# Patient Record
Sex: Female | Born: 1955 | Race: White | Hispanic: No | Marital: Married | Smoking: Never smoker
Health system: Southern US, Community
[De-identification: ages and names within clinical notes are randomized; demographics above are authoritative.]

## PROBLEM LIST (undated history)

## (undated) DIAGNOSIS — N390 Urinary tract infection, site not specified: Secondary | ICD-10-CM

## (undated) DIAGNOSIS — R002 Palpitations: Secondary | ICD-10-CM

## (undated) DIAGNOSIS — G2581 Restless legs syndrome: Secondary | ICD-10-CM

## (undated) DIAGNOSIS — R55 Syncope and collapse: Secondary | ICD-10-CM

## (undated) DIAGNOSIS — E785 Hyperlipidemia, unspecified: Secondary | ICD-10-CM

## (undated) DIAGNOSIS — K219 Gastro-esophageal reflux disease without esophagitis: Secondary | ICD-10-CM

## (undated) DIAGNOSIS — I10 Essential (primary) hypertension: Secondary | ICD-10-CM

## (undated) DIAGNOSIS — E538 Deficiency of other specified B group vitamins: Secondary | ICD-10-CM

## (undated) DIAGNOSIS — N189 Chronic kidney disease, unspecified: Secondary | ICD-10-CM

## (undated) DIAGNOSIS — F329 Major depressive disorder, single episode, unspecified: Secondary | ICD-10-CM

## (undated) DIAGNOSIS — F32A Depression, unspecified: Secondary | ICD-10-CM

## (undated) HISTORY — DX: Depression, unspecified: F32.A

## (undated) HISTORY — PX: SALPINGOOPHORECTOMY: SHX82

## (undated) HISTORY — DX: Hyperlipidemia, unspecified: E78.5

## (undated) HISTORY — DX: Deficiency of other specified B group vitamins: E53.8

## (undated) HISTORY — DX: Major depressive disorder, single episode, unspecified: F32.9

## (undated) HISTORY — DX: Urinary tract infection, site not specified: N39.0

## (undated) HISTORY — DX: Essential (primary) hypertension: I10

## (undated) HISTORY — DX: Chronic kidney disease, unspecified: N18.9

## (undated) HISTORY — DX: Gastro-esophageal reflux disease without esophagitis: K21.9

## (undated) HISTORY — DX: Restless legs syndrome: G25.81

## (undated) HISTORY — DX: Syncope and collapse: R55

## (undated) HISTORY — DX: Palpitations: R00.2

---

## 1974-09-18 HISTORY — PX: BREAST EXCISIONAL BIOPSY: SUR124

## 2000-02-14 ENCOUNTER — Encounter (INDEPENDENT_AMBULATORY_CARE_PROVIDER_SITE_OTHER): Payer: Self-pay

## 2000-02-14 ENCOUNTER — Ambulatory Visit (HOSPITAL_COMMUNITY): Admission: RE | Admit: 2000-02-14 | Discharge: 2000-02-14 | Payer: Self-pay | Admitting: Urology

## 2000-04-04 ENCOUNTER — Encounter: Payer: Self-pay | Admitting: Gastroenterology

## 2000-04-04 ENCOUNTER — Encounter (INDEPENDENT_AMBULATORY_CARE_PROVIDER_SITE_OTHER): Payer: Self-pay

## 2000-04-04 ENCOUNTER — Ambulatory Visit (HOSPITAL_COMMUNITY): Admission: RE | Admit: 2000-04-04 | Discharge: 2000-04-04 | Payer: Self-pay | Admitting: Gastroenterology

## 2000-04-06 ENCOUNTER — Other Ambulatory Visit: Admission: RE | Admit: 2000-04-06 | Discharge: 2000-04-06 | Payer: Self-pay | Admitting: Obstetrics & Gynecology

## 2000-05-09 ENCOUNTER — Ambulatory Visit: Admission: RE | Admit: 2000-05-09 | Discharge: 2000-05-09 | Payer: Self-pay | Admitting: Gastroenterology

## 2000-05-09 ENCOUNTER — Encounter: Payer: Self-pay | Admitting: Gastroenterology

## 2000-05-14 ENCOUNTER — Ambulatory Visit (HOSPITAL_COMMUNITY): Admission: RE | Admit: 2000-05-14 | Discharge: 2000-05-14 | Payer: Self-pay | Admitting: Internal Medicine

## 2000-05-14 ENCOUNTER — Encounter: Payer: Self-pay | Admitting: Internal Medicine

## 2002-10-06 ENCOUNTER — Encounter: Admission: RE | Admit: 2002-10-06 | Discharge: 2002-10-06 | Payer: Self-pay | Admitting: Internal Medicine

## 2002-10-06 ENCOUNTER — Encounter: Payer: Self-pay | Admitting: Internal Medicine

## 2002-11-20 ENCOUNTER — Encounter: Payer: Self-pay | Admitting: Internal Medicine

## 2002-11-20 ENCOUNTER — Ambulatory Visit (HOSPITAL_COMMUNITY): Admission: RE | Admit: 2002-11-20 | Discharge: 2002-11-20 | Payer: Self-pay | Admitting: Internal Medicine

## 2002-11-24 ENCOUNTER — Ambulatory Visit (HOSPITAL_COMMUNITY): Admission: RE | Admit: 2002-11-24 | Discharge: 2002-11-24 | Payer: Self-pay | Admitting: Internal Medicine

## 2002-11-24 ENCOUNTER — Encounter: Payer: Self-pay | Admitting: Internal Medicine

## 2002-12-18 ENCOUNTER — Ambulatory Visit (HOSPITAL_COMMUNITY): Admission: RE | Admit: 2002-12-18 | Discharge: 2002-12-18 | Payer: Self-pay | Admitting: Gastroenterology

## 2002-12-18 ENCOUNTER — Encounter: Payer: Self-pay | Admitting: Gastroenterology

## 2003-01-15 ENCOUNTER — Other Ambulatory Visit: Admission: RE | Admit: 2003-01-15 | Discharge: 2003-01-15 | Payer: Self-pay | Admitting: Obstetrics and Gynecology

## 2003-02-20 ENCOUNTER — Ambulatory Visit (HOSPITAL_COMMUNITY): Admission: RE | Admit: 2003-02-20 | Discharge: 2003-02-20 | Payer: Self-pay | Admitting: Obstetrics & Gynecology

## 2003-02-20 ENCOUNTER — Encounter (INDEPENDENT_AMBULATORY_CARE_PROVIDER_SITE_OTHER): Payer: Self-pay

## 2003-12-30 ENCOUNTER — Encounter: Admission: RE | Admit: 2003-12-30 | Discharge: 2003-12-30 | Payer: Self-pay | Admitting: Family Medicine

## 2004-01-14 ENCOUNTER — Encounter: Admission: RE | Admit: 2004-01-14 | Discharge: 2004-01-14 | Payer: Self-pay | Admitting: Sports Medicine

## 2004-01-21 ENCOUNTER — Encounter: Admission: RE | Admit: 2004-01-21 | Discharge: 2004-01-21 | Payer: Self-pay | Admitting: Family Medicine

## 2004-01-28 ENCOUNTER — Encounter: Admission: RE | Admit: 2004-01-28 | Discharge: 2004-01-28 | Payer: Self-pay | Admitting: Sports Medicine

## 2004-02-09 ENCOUNTER — Ambulatory Visit (HOSPITAL_COMMUNITY): Admission: RE | Admit: 2004-02-09 | Discharge: 2004-02-09 | Payer: Self-pay | Admitting: Family Medicine

## 2004-02-18 ENCOUNTER — Encounter: Admission: RE | Admit: 2004-02-18 | Discharge: 2004-02-18 | Payer: Self-pay | Admitting: Sports Medicine

## 2004-12-09 ENCOUNTER — Other Ambulatory Visit: Admission: RE | Admit: 2004-12-09 | Discharge: 2004-12-09 | Payer: Self-pay | Admitting: Obstetrics & Gynecology

## 2005-01-18 ENCOUNTER — Encounter: Admission: RE | Admit: 2005-01-18 | Discharge: 2005-01-18 | Payer: Self-pay | Admitting: Family Medicine

## 2005-05-06 ENCOUNTER — Encounter: Admission: RE | Admit: 2005-05-06 | Discharge: 2005-05-06 | Payer: Self-pay | Admitting: Family Medicine

## 2005-10-13 ENCOUNTER — Encounter: Admission: RE | Admit: 2005-10-13 | Discharge: 2005-10-13 | Payer: Self-pay | Admitting: Family Medicine

## 2006-11-15 ENCOUNTER — Encounter: Admission: RE | Admit: 2006-11-15 | Discharge: 2006-11-15 | Payer: Self-pay | Admitting: Nephrology

## 2009-10-19 ENCOUNTER — Encounter: Admission: RE | Admit: 2009-10-19 | Discharge: 2009-10-19 | Payer: Self-pay | Admitting: Family Medicine

## 2010-06-24 ENCOUNTER — Encounter: Payer: Self-pay | Admitting: Cardiology

## 2010-07-01 ENCOUNTER — Encounter: Payer: Self-pay | Admitting: Cardiology

## 2010-07-19 ENCOUNTER — Ambulatory Visit: Payer: Self-pay | Admitting: Cardiology

## 2010-07-19 DIAGNOSIS — I10 Essential (primary) hypertension: Secondary | ICD-10-CM

## 2010-07-19 DIAGNOSIS — E785 Hyperlipidemia, unspecified: Secondary | ICD-10-CM

## 2010-07-19 DIAGNOSIS — R079 Chest pain, unspecified: Secondary | ICD-10-CM

## 2010-07-19 HISTORY — DX: Chest pain, unspecified: R07.9

## 2010-07-25 ENCOUNTER — Telehealth: Payer: Self-pay | Admitting: Cardiology

## 2010-08-08 ENCOUNTER — Ambulatory Visit: Payer: Self-pay

## 2010-08-08 ENCOUNTER — Ambulatory Visit: Payer: Self-pay | Admitting: Cardiology

## 2010-10-08 ENCOUNTER — Encounter: Payer: Self-pay | Admitting: Gastroenterology

## 2010-10-09 ENCOUNTER — Encounter: Payer: Self-pay | Admitting: Family Medicine

## 2010-10-18 NOTE — Assessment & Plan Note (Signed)
Summary: np6/chest pains   Visit Type:  Initial Consult Primary Provider:  Dr. Raquel James  CC:  chest pain.  History of Present Illness: 55 yo with history of HTN and hyperlipidemia presents for evaluation of burning in chest.  This has been going on for several months.  These episodes do not seem to be related to exertion or meals.  The chest burning episodes had been occurring daily initially.  Patient's PCP started her on two times a day omeprazole which seems to have ameliorated but not resolved the symptoms (not occurring as often).  She has also noted that she gets short of breath walking around the block near her house.  She is able to climb steps and do most activities without any trouble, however.  She was recently started on simvastatin for elevated LDL cholesterol.   Patient is worried because she has a strong family history of CAD (I cared for her father).  Her mother's angina presented as heartburn-type symptoms.   ECG: NSR, normal  Labs (10/11): HDL 62, LDL 146, LDL particle number 1461, creatinine 1.18, K 4.0  Current Medications (verified): 1)  Cymbalta 30 Mg Cpep (Duloxetine Hcl) .... 3 Caps Am 2)  Diovan Hct 80-12.5 Mg Tabs (Valsartan-Hydrochlorothiazide) .... Take 1 Tablet By Mouth Once A Day 3)  Atenolol 25 Mg Tabs (Atenolol) .... Take One Tablet By Mouth Daily 4)  Zocor 10 Mg Tabs (Simvastatin) .... Take 1 Tablet By Mouth Once A Day 5)  Prilosec Otc 20 Mg Tbec (Omeprazole Magnesium) .... Take 1 Tablet By Mouth Two Times A Day  Allergies (verified): No Known Drug Allergies  Past History:  Past Medical History: 1. Hyperlipidemia 2. Depression 3. HTN: Had ankle swelling with amlodipine 4. GERD 5. B12 deficiency 6. CKD 7. Restless leg syndrome  Family History: Father with first MI at 59, developed ischemic cardiomyopathy. Mother with MI in her 81s, had sudden cardiac death  Social History: Never smoked.  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  Lives in [place]. Married.   Review of Systems       All systems reviewed and negative except as per HPI.   Vital Signs:  Patient profile:   55 year old female Height:      66 inches Weight:      131.50 pounds BMI:     21.30 Pulse rate:   60 / minute Pulse rhythm:   regular Resp:     18 per minute BP sitting:   122 / 73  (left arm) Cuff size:   regular  Vitals Entered By: Vikki Ports (July 19, 2010 11:01 AM)  Physical Exam  General:  Well developed, well nourished, in no acute distress. Head:  normocephalic and atraumatic Nose:  no deformity, discharge, inflammation, or lesions Mouth:  Teeth, gums and palate normal. Oral mucosa normal. Neck:  Neck supple, no JVD. No masses, thyromegaly or abnormal cervical nodes. Lungs:  Clear bilaterally to auscultation and percussion. Heart:  Non-displaced PMI, chest non-tender; regular rate and rhythm, S1, S2 without murmurs, rubs or gallops. Carotid upstroke normal, no bruit.  Pedals normal pulses. No edema, no varicosities. Abdomen:  Bowel sounds positive; abdomen soft and non-tender without masses, organomegaly, or hernias noted. No hepatosplenomegaly. Msk:  Back normal, normal gait. Muscle strength and tone normal. Extremities:  No clubbing or cyanosis. Neurologic:  Alert and oriented x 3. Skin:  Intact without lesions or rashes. Psych:  Normal affect.   Impression & Recommendations:  Problem #  1:  CHEST PAIN (ICD-786.50) Patient has been having atypical chest pain (burning in chest).  This is a new symptom over the last couple of months.  This certainly could be GERD as it has improved with omeprazole.  However, she has a very strong family history of coronary disease and also has HTN and hyperlipidemia.  I will risk stratify her with an ETT-myoview.  She should start ASA 81 mg daily.   Problem # 2:  HYPERTENSION, UNSPECIFIED (ICD-401.9) BP is under good control.   Problem # 3:  HYPERLIPIDEMIA-MIXED  (ICD-272.4) LDL 146 with significantly elevated LDL particle number.  Given her family history, I will increase her simvastatin up to 20 mg daily.    Other Orders: Nuclear Stress Test (Nuc Stress Test)  Patient Instructions: 1)  Your physician has recommended you make the following change in your medication:  2)  Increase Zocor(simvastatin) to 20mg  daily in the evening--you can take two 10mg  tablets. 3)  Take Aspirin 81mg  daily--this should be buffered or coated. 4)  Your physician has requested that you have an exercise stress myoview.  For further information please visit [email or web address]  Please follow instruction sheet, as given. 5)  Your physician recommends that you schedule a follow-up appointment in: 2 weeks with Dr Shirlee Latch. Prescriptions: ZOCOR 20 MG TABS (SIMVASTATIN) one in the evening  #30 x 3   Entered by:   Katina Dung, RN, BSN   Authorized by:   Marca Ancona, MD   Signed by:   Katina Dung, RN, BSN on 07/19/2010   Method used:   Electronically to        [facility]* (retail)       [address]       [place], [place]  [address]       Ph: [phone]       Fax: [phone]   RxID:   [id number]

## 2010-10-18 NOTE — Letter (Signed)
Summary: Family @ [facility]  Family @ [facility]   Imported By: Roderic Ovens 07/25/2010 15:32:37  _____________________________________________________________________  External Attachment:    Type:   Image     Comment:   External Document

## 2010-10-18 NOTE — Progress Notes (Signed)
Summary: [employer] would not authorize myoview  Phone Note Outgoing Call   Call placed by: Katina Dung, RN, BSN,  July 25, 2010 12:22 PM Call placed to: Patient Summary of Call: insurance would not authorize myoview  Follow-up for Phone Call        Dr Shirlee Latch talked with Dr Wallace Cullens at [facility] [phone] opt 2--he would  not authorize myoview--Dr Shirlee Latch recommended pt have GXT since BCBS would not authorize myoview--I talked with pt --she verbalized understanding-     Appended Document: BCBS would not authorize myoview I talked with Burna Mortimer and cancelled myoview scheduled for 07/27/10

## 2010-12-20 ENCOUNTER — Ambulatory Visit: Payer: Self-pay | Admitting: Family Medicine

## 2011-01-05 ENCOUNTER — Ambulatory Visit: Payer: Self-pay | Admitting: Family Medicine

## 2011-01-13 ENCOUNTER — Other Ambulatory Visit (HOSPITAL_COMMUNITY)
Admission: RE | Admit: 2011-01-13 | Discharge: 2011-01-13 | Disposition: A | Discharge status: Home / Self Care | Payer: BC Managed Care – PPO | Source: Ambulatory Visit | Attending: Family Medicine | Admitting: Family Medicine

## 2011-01-13 ENCOUNTER — Ambulatory Visit (INDEPENDENT_AMBULATORY_CARE_PROVIDER_SITE_OTHER): Payer: BC Managed Care – PPO | Admitting: Family Medicine

## 2011-01-13 ENCOUNTER — Encounter: Payer: Self-pay | Admitting: Family Medicine

## 2011-01-13 DIAGNOSIS — I10 Essential (primary) hypertension: Secondary | ICD-10-CM | POA: Insufficient documentation

## 2011-01-13 DIAGNOSIS — Z01419 Encounter for gynecological examination (general) (routine) without abnormal findings: Secondary | ICD-10-CM | POA: Insufficient documentation

## 2011-01-13 DIAGNOSIS — E538 Deficiency of other specified B group vitamins: Secondary | ICD-10-CM

## 2011-01-13 DIAGNOSIS — N189 Chronic kidney disease, unspecified: Secondary | ICD-10-CM

## 2011-01-13 DIAGNOSIS — Z1159 Encounter for screening for other viral diseases: Secondary | ICD-10-CM | POA: Insufficient documentation

## 2011-01-13 DIAGNOSIS — Z1231 Encounter for screening mammogram for malignant neoplasm of breast: Secondary | ICD-10-CM

## 2011-01-13 DIAGNOSIS — N952 Postmenopausal atrophic vaginitis: Secondary | ICD-10-CM

## 2011-01-13 DIAGNOSIS — Z0000 Encounter for general adult medical examination without abnormal findings: Secondary | ICD-10-CM

## 2011-01-13 DIAGNOSIS — E785 Hyperlipidemia, unspecified: Secondary | ICD-10-CM | POA: Insufficient documentation

## 2011-01-13 LAB — BASIC METABOLIC PANEL
Calcium: 9.3 mg/dL (ref 8.4–10.5)
GFR: 44.93 mL/min — ABNORMAL LOW (ref >60.00)
Glucose, Bld: 89 mg/dL (ref 70–99)
Sodium: 141 mEq/L (ref 135–145)

## 2011-01-13 LAB — LIPID PANEL
HDL: 44.8 mg/dL (ref >39.00)
Total CHOL/HDL Ratio: 5
Triglycerides: 86 mg/dL (ref 0.0–149.0)
VLDL: 17.2 mg/dL (ref 0.0–40.0)

## 2011-01-13 MED ORDER — ESTRADIOL 0.1 MG/GM VA CREA: 1.0000 g | TOPICAL_CREAM | Freq: Every day | VAGINAL | Status: DC

## 2011-01-13 MED ORDER — ESTRADIOL 0.1 MG/GM VA CREA: 0.1647 | TOPICAL_CREAM | Freq: Every day | VAGINAL | Status: DC

## 2011-01-13 NOTE — Assessment & Plan Note (Signed)
Recheck BMET today 

## 2011-01-13 NOTE — Patient Instructions (Signed)
Great to meet you. Please stop by to see Heather [name] on your way out. 

## 2011-01-13 NOTE — Assessment & Plan Note (Signed)
Deteriorated. Discussed risks and benefits of HRT. She would like to restart estrace.

## 2011-01-13 NOTE — Assessment & Plan Note (Signed)
Reviewed preventive care protocols, scheduled due services, and updated immunizations Discussed nutrition, exercise, diet, and healthy lifestyle.  Pap smear performed today. IFOB and mammogram ordered.

## 2011-01-13 NOTE — Progress Notes (Signed)
55 yo female here to establish care and for CPX.  Was seeing Dr. Raquel James until practice closed down.  HLD- has h/o elevated lipids, most recently in 06/2010- LDL 146, HDL 62.  Father had MI at 49 yo. Was prescribed Simvastatin 20 mg qhs but stopped taking it.  No reported side effect, just kept forgetting.  Vit B12 deficiency- was previously receiving injections.  Has not had B12 checked since last year. Does feel a little more fatigued. Denies SOB, CP or LE edema.  HTN- has had issues with HTN since she was in her 30s.  Currently taking Diovan/HCTZ and atenolol.  Denies any HA, blurred vision or LE edema.  H/o chronic renal insufficiency- will check BMET today, Cr has been stable between 1.3-1.4 according to pt.  Depression- stable on cymbalta.  Feels much less anxious since she retired.  Well woman- refusing colonoscopy but will to try IFOB. Has a GYN , Dr. Arlyce Dice, but has not been since 2009 so she is due for both mammogram and pap smear. G1P1.  No personal or family h/o uterine, ovarian, cervical or breast CA.  Vaginal dryness- was on vaginal estradiol but stopped following up with GYN. Has been postmenopausal since 2006.  Now having dryness and pain with intercourse. Denies hot flashes or insomnia.  The PMH, PSH, Social History, Family History, Medications, and allergies have been reviewed in [facility], and have been updated if relevant.  ROS: General: Denies fever, chills, sweats. No significant weight loss. Eyes: Denies blurring,significant itching ENT: Denies earache, sore throat, and hoarseness.  Cardiovascular: Denies chest pains, palpitations, dyspnea on exertion,  Respiratory: Denies cough, dyspnea at rest,wheeezing Breast: no concerns about lumps GI: Denies nausea, vomiting, diarrhea, constipation, change in bowel habits, abdominal pain, melena, hematochezia GU: Denies dysuria, hematuria, urinary hesitancy, nocturia, denies STD risk, no concerns about  discharge Musculoskeletal: Denies back pain, joint pain Derm: Denies rash, itching Neuro: Denies  paresthesias, frequent falls, frequent headaches Psych: Denies depression, anxiety Endocrine: Denies cold intolerance, heat intolerance, polydipsia Heme: Denies enlarged lymph nodes Allergy: No hayfever  Physical Exam: BP 122/88  Pulse 62  Temp(Src) 98.4 F (36.9 C) (Oral)  Ht 5\' 6"  (1.676 m)  Wt 139 lb 12.8 oz (63.413 kg)  BMI 22.56 kg/m2  General:  Well-developed,well-nourished,in no acute distress; alert,appropriate and cooperative throughout examination Head:  normocephalic and atraumatic.   Eyes:  vision grossly intact, pupils equal, pupils round, and pupils reactive to light.   Ears:  R ear normal and L ear normal.   Nose:  no external deformity.   Mouth:  good dentition.   Neck:  No deformities, masses, or tenderness noted. Breasts:  No mass, nodules, thickening, tenderness, bulging, retraction, inflamation, nipple discharge or skin changes noted.   Lungs:  Normal respiratory effort, chest expands symmetrically. Lungs are clear to auscultation, no crackles or wheezes. Heart:  Normal rate and regular rhythm. S1 and S2 normal without gallop, murmur, click, rub or other extra sounds. Abdomen:  Bowel sounds positive,abdomen soft and non-tender without masses, organomegaly or hernias noted. Rectal:  no external abnormalities.   Genitalia:  Pelvic Exam:        External: normal female genitalia without lesions or masses        Vagina: vaginal atrophy        Cervix: normal without lesions or masses        Adnexa: normal bimanual exam without masses or fullness        Uterus: normal by palpation  Pap smear: performed Msk:  No deformity or scoliosis noted of thoracic or lumbar spine.   Extremities:  No clubbing, cyanosis, edema, or deformity noted with normal full range of motion of all joints.   Neurologic:  alert & oriented X3 and gait normal.   Skin:  Intact without  suspicious lesions or rashes Cervical Nodes:  No lymphadenopathy noted Axillary Nodes:  No palpable lymphadenopathy Psych:  Cognition and judgment appear intact. Alert and cooperative with normal attention span and concentration. No apparent delusions, illusions, hallucinations

## 2011-01-13 NOTE — Assessment & Plan Note (Signed)
Stable. Continue current meds.   

## 2011-01-13 NOTE — Assessment & Plan Note (Signed)
-   Recheck lipid panel today

## 2011-01-19 ENCOUNTER — Inpatient Hospital Stay: Admission: RE | Admit: 2011-01-19 | Payer: BC Managed Care – PPO | Source: Ambulatory Visit

## 2011-01-23 ENCOUNTER — Encounter: Payer: Self-pay | Admitting: *Deleted

## 2011-01-24 ENCOUNTER — Encounter: Payer: Self-pay | Admitting: Family Medicine

## 2011-01-27 ENCOUNTER — Ambulatory Visit
Admission: RE | Admit: 2011-01-27 | Discharge: 2011-01-27 | Disposition: A | Discharge status: Home / Self Care | Payer: BC Managed Care – PPO | Source: Ambulatory Visit | Attending: Family Medicine | Admitting: Family Medicine

## 2011-01-27 ENCOUNTER — Other Ambulatory Visit: Payer: Self-pay | Admitting: Family Medicine

## 2011-01-27 ENCOUNTER — Ambulatory Visit: Admission: RE | Admit: 2011-01-27 | Payer: BC Managed Care – PPO | Source: Ambulatory Visit

## 2011-01-27 DIAGNOSIS — Z1231 Encounter for screening mammogram for malignant neoplasm of breast: Secondary | ICD-10-CM

## 2011-02-02 ENCOUNTER — Encounter: Payer: Self-pay | Admitting: *Deleted

## 2011-02-03 NOTE — Op Note (Signed)
[facility]  Patient:    Heather [name], Heather [name]                      MRN: [id number] Proc. Date: 02/14/00 Adm. Date:  [id number] Disc. Date: [id number] Attending:  Londell Moh CC:         Jamison Neighbor, M.D.             Kearney Hard, P.A., [facility]                           Operative Report  PREOPERATIVE DIAGNOSIS:  Interstitial cystitis.  POSTOPERATIVE DIAGNOSIS:  Interstitial cystitis.  PROCEDURE:  Cystoscopy, urethral calibration, hydrodistention of bladder, bladder biopsy, Marcaine and Pyridium instillation, Marcaine and Kenalog injection.  SURGEON:  Jamison Neighbor, M.D.  ANESTHESIA:  General.  COMPLICATIONS:  None.  DRAINS:  None.  BRIEF HISTORY:  This 55 year old female has urinary urgency, frequency, and pain.  She has not responded to anticholinergic therapy.  She did note that Urised cut down somewhat on her discomfort, but she notes she has to void every 20 minutes.  Urodynamic evaluation showed severe sensory urgency and strong discomfort when the patients bladder was filled beyond 68 cc.  She had a real sensation of pain and burning during filling but had no leak.  An outpatient cystoscopic examination in the office showed no evidence of stress incontinence but severe urgency with no other abnormalities detected.  Based on this patients symptoms of unexplained urgency and frequency not responding to anticholinergic therapy, it was felt that the patient should be evaluated for interstitial cystitis.  She understands the risks and benefits of the procedure, including the fact that the diagnostic procedure itself may or may not help her symptoms and certainly will temporarily increase her symptoms. She gave fully informed consent.  DESCRIPTION OF PROCEDURE:  After successful induction of general anesthesia, the patient was placed in the dorsal lithotomy position, prepped with Betadine, and draped in the usual  sterile fashion.  Careful bimanual examination revealed a normal urethra with no evidence of diverticulum.  There was no urethrocele, cystocele, rectocele, or enterocele.  There were no masses on bimanual exam.  The urethra was calibrated to 35 [place] with female urethral sounds, with no stenosis or stricture.  The bladder was carefully inspected.  It was free of any tumor or stones.  Both ureteral orifices were normal in configuration in location.  The patient had no evidence of any squamous metaplasia, no signs of cystitis cystica or cystitis glandularis. The bladder was distended to a pressure of 100 cmH2O for five minutes.  When the bladder was drained, there was a somewhat diminished bladder capacity of 650 cc, a terminal blood-tinge, and evidence of glomerulations throughout the bladder.  This is consistent with interstitial cystitis.  A bladder biopsy was taken and sent for mast cell analysis, and the biopsy site was cauterized.  A mixture of Marcaine and Pyridium was left in the bladder.  Marcaine and Kenalog were injected periurethrally.  A vaginal gauze was placed.  The patient was given Toradol in the operating room.  She was taken to the recovery room in good condition.  She will receive additional pain medication as well as Zofran for nausea and will receive a B&O suppository in the PACU. The patient will be given a prescription for Lorcet Plus to take as needed for pain, Pyridium Plus to take as needed for burning  _____ and to take on an as-needed basis.  She will return to my office in three weeks time for further follow-up. DD:  02/14/00 TD:  02/16/00 Job: [id number]

## 2011-02-03 NOTE — Procedures (Signed)
[facility]  Patient:    Heather [name], Heather [name]                      MRN: [id number] Proc. Date: 04/04/00 Adm. Date:  [id number] Disc. Date: [id number] Attending:  Deneen Harts CC:         Janae Bridgeman. Eloise Harman., M.D.             Tarri Fuller, PA-C                           Procedure Report  PROCEDURE PERFORMED:  Panendoscopy, biopsy, Savary esophageal dilation.  ENDOSCOPIST:  Griffith Citron, M.D.  INDICATIONS FOR PROCEDURE:  The patient is a 55 year old white female with a history of progressive solid food dysphagia over the past three months. Symptoms have somewhat improved with Pepcid and more recently treated with Aciphex with improved control.  Symptoms exacerbated postprandially and with stress.  Frequent impaction requiring self-induced regurgitation approximately once weekly.  Denies hematemesis.  Appetite is excellent.  Weight stable.  No risk factors.  Without tobacco, alcohol, family history.  Associated abdominal pain substernal radiating into the midback.  DESCRIPTION OF PROCEDURE:  After reviewing the nature of the procedure with the patient including potential risks of hemorrhage and perforation, and after discussing alternative methods of diagnosis, i.e. barium swallow, informed consent was signed.  The patient was brought to the fluoroscopy suite where she was premedicated with topical anesthetic followed by IV sedation totalling Versed 8 mg, fentanyl 100 mcg administered IV in divided doses, prior to and during the course of the procedure.  Using an Olympus video endoscope, the proximal esophagus intubated under direct vision. The oropharynx was normal without lesion of the epiglottis, vocal cords or piriform sinus.  The scope was advanced into the upper esophagus without difficulty.  The proximal segment was normal.  Beginning at the midsegment there was diffuse linear erythema extending from the midesophagus to the mucosal  Z-line at 37 cm.  No evidence of mucosal erosion. The mucosa was edematous, not friable.  No exudate.  I did not appreciate any stricture endoscopically.  The mucosal z-line was distinct at 37 cm.  No hiatal hernia was present.  The gastric fundus was normal except for a 1 cm polyp along the greater curve at the junction of the fundus and body.  This was biopsied.  The remainder of the stomach appeared normal throughout the body and antrum.  Pylorus symmetric.  Easily traversed.  Duodenal bulb and second portion were normal.  Retroflex view of the angularis, lesser curve, gastric cardia and fundus confirmed the finding of the polyp noted above but no additional lesion was appreciated.  A Savary guide wire was laid along the greater curve of the stomach.  Under fluoroscopic control, a 17 mm diameter dilator was passed across the diaphragmatic crus.  It was withdrawn along with the guide wire.  Tolerated without difficulty.  No heme staining or chest pain.  The patient tolerated the procedure without difficulty being maintained on Datascope monitor and low-flow oxygen throughout.  Returned to recovery in stable condition.  ASSESSMENT: 1. Esophagitis--probably reflux induced.  No endoscopic stricture appreciated. 2. Savary dilation--17 mm diameter. 3. Gastric polyp--benign appearing--biopsy obtained, rule out adenoma.  RECOMMENDATIONS: 1. Follow up pathology. 2. Change PPI to Nexium 40 mg daily. 3. ROV one month to review clinical course. DD:  04/04/00 TD:  04/06/00 Job: [id number]

## 2011-02-03 NOTE — Op Note (Signed)
NAME:  Heather [name], Heather [name]                         ACCOUNT NO.:  [id number]   MEDICAL RECORD NO.:  [id number]                   PATIENT TYPE:  AMB   LOCATION:  SDC                                  FACILITY:  [facility]   PHYSICIAN:  Ilda Mori, M.D.                DATE OF BIRTH:  1956-07-21   DATE OF PROCEDURE:  02/20/2003  DATE OF DISCHARGE:                                 OPERATIVE REPORT   PREOPERATIVE DIAGNOSIS:  Right lower quadrant pain.   POSTOPERATIVE DIAGNOSES:  1. Endometriosis.  2. Small right ovarian cyst.   PROCEDURE:  Laparoscopic right salpingo-oophorectomy.   SURGEON:  Ilda Mori, M.D.   ASSISTANT:  Carrington Clamp, M.D.   ANESTHESIA:  General endotracheal.   ESTIMATED BLOOD LOSS:  20 mL.   FINDINGS:  The left ovary appeared normal.  The left sigmoid was adherent to  the left upper pelvic sidewall.  The left tube appeared normal post  reanastomosis.  There was evidence of endometriosis in the broad ligament  and lateral to the uterosacral and small areas of endometriosis and  peritoneal scarring in the cul-de-sac.  The right adnexa, the right ovary  had a small cystic structure.  There was a small amount of endometriosis  noted in the right broad ligament and on the right uterosacral.  The  appendix appeared normal.  There was no other pathology noted.   INDICATIONS:  This is a 55 year old gravida 1, para 1, who noted severe  right lower quadrant pain for approximately two months that interfered with  her sleep and her work.  Evaluation by a gastroenterologist revealed no GI  pathology.  Ultrasound was normal.  Discussion with the patient was then  undertaken and because of the severity and persistence of the pain, a  decision was made to do a diagnostic laparoscopy and to remove the right  adnexa if any pathology was found.   DESCRIPTION OF PROCEDURE:  The patient was taken to the operating room and  after general anesthesia was induced, the patient was  placed in the dorsal  lithotomy position.  The abdomen and perineum and vagina were prepped and  draped in a sterile fashion.  The bladder was catheterized.  A Hulka  tenaculum was placed in the endocervical canal, affixed to the anterior lip  of  the cervix.  The surgeon re-gowned and gloved.  An incision was made at  the base of the umbilicus and a Veress needle introduced to create a  pneumoperitoneum.  After pneumoperitoneum, a 10 mm trocar was introduced  through the umbilical incision and the pelvis was viewed with the findings  noted above.  An accessory instrument was placed through stab wounds at the  right and left lower quadrant, taking care to avoid the inferior epigastric  arteries.  The appendix was viewed to be normal and was left in situ.  The  left adnexa showed some signs  of endometriosis but these were minimal and  did not involve the ovary and since the patient was having no pain in this  area and a decision had been made not to remove both adnexa, the left ovary  was left in situ as well.  Attention was turned to the right adnexa.  The  infundibulopelvic ligament was isolated, cauterized, and cut with the  tripolar cautery instrument.  The peritoneum was then cut free under the  ovary to drop the ureter out of the field.  The rest of the broad ligament  and infundibulopelvic ligament was cauterized and cut.  The ovarian  ligament, tubal isthmus, and round ligaments were cauterized and cut, and  the adnexa was removed.  The hemostasis was noted to be present.  The right  adnexa was then placed in an Endobag that was placed through the 10 mm  umbilical port.  A 5 mm camera was placed in the left lower quadrant to  visualize the operative field.  The bag was then removed through the  umbilical incision.  The umbilical fascia was closed with a figure-of-eight  0 Vicryl suture and the skin in the umbilical incision was closed with a 4-0  Dexon suture.  The right and left  lower quadrant incisions were closed with  Dermabond.  The patient tolerated the procedure well and left the operating  room in good condition.                                               Ilda Mori, M.D.    RK/MEDQ  D:  02/20/2003  T:  02/20/2003  Job:  [id number]

## 2011-04-24 ENCOUNTER — Other Ambulatory Visit: Payer: Self-pay | Admitting: Family Medicine

## 2011-04-24 ENCOUNTER — Other Ambulatory Visit: Payer: Self-pay | Admitting: *Deleted

## 2011-04-24 MED ORDER — VALSARTAN-HYDROCHLOROTHIAZIDE 80-12.5 MG PO TABS: 1.0000 | ORAL_TABLET | Freq: Every day | ORAL | Status: DC

## 2011-04-24 MED ORDER — ATENOLOL 25 MG PO TABS: 25.0000 mg | ORAL_TABLET | Freq: Every day | ORAL | Status: DC

## 2011-04-24 NOTE — Telephone Encounter (Signed)
Pt needs rx's for mail order. I will place form in your inbox

## 2011-07-12 ENCOUNTER — Other Ambulatory Visit: Payer: Self-pay | Admitting: *Deleted

## 2011-07-12 MED ORDER — OMEPRAZOLE 20 MG PO CPDR: 20.0000 mg | DELAYED_RELEASE_CAPSULE | Freq: Two times a day (BID) | ORAL | Status: DC

## 2011-07-20 ENCOUNTER — Telehealth: Payer: Self-pay | Admitting: Family Medicine

## 2011-07-20 MED ORDER — PROGESTERONE MICRONIZED 100 MG PO CAPS: 100.0000 mg | ORAL_CAPSULE | Freq: Every day | ORAL | Status: DC

## 2011-07-20 NOTE — Telephone Encounter (Signed)
Left voicemail for pt to return my call. Has been on unopposed estrogen for a few months now. Since she still has a uterus, I would like to add Prometrium 100 mg daily to her estrogen. I will send to pharmacy.

## 2011-07-21 ENCOUNTER — Telehealth: Payer: Self-pay | Admitting: Family Medicine

## 2011-07-21 NOTE — Telephone Encounter (Signed)
Spoke to pt, we had the wrong cell phone number. Number is [phone]. She has not been taking estrogen but would like to start. Will also add Prometrium 100 mg daily. Rx sent to her pharmacy.

## 2011-10-03 ENCOUNTER — Encounter: Payer: Self-pay | Admitting: Family Medicine

## 2011-10-03 ENCOUNTER — Ambulatory Visit (INDEPENDENT_AMBULATORY_CARE_PROVIDER_SITE_OTHER): Payer: BC Managed Care – PPO | Admitting: Family Medicine

## 2011-10-03 VITALS — BP 110/80 | HR 72 | Temp 98.3°F | Wt 142.5 lb

## 2011-10-03 DIAGNOSIS — I889 Nonspecific lymphadenitis, unspecified: Secondary | ICD-10-CM

## 2011-10-03 LAB — CBC WITH DIFFERENTIAL/PLATELET
Basophils Absolute: 0.1 10*3/uL (ref 0.0–0.1)
Basophils Relative: 1.2 % (ref 0.0–3.0)
HCT: 33.2 % — ABNORMAL LOW (ref 36.0–46.0)
Hemoglobin: 11.4 g/dL — ABNORMAL LOW (ref 12.0–15.0)
Lymphocytes Relative: 25.6 % (ref 12.0–46.0)
Lymphs Abs: 1.6 10*3/uL (ref 0.7–4.0)
MCHC: 34.3 g/dL (ref 30.0–36.0)
MCV: 88.6 fl (ref 78.0–100.0)
Neutro Abs: 4 10*3/uL (ref 1.4–7.7)
Platelets: 211 10*3/uL (ref 150.0–400.0)
RBC: 3.75 Mil/uL — ABNORMAL LOW (ref 3.87–5.11)
RDW: 14.2 % (ref 11.5–14.6)
WBC: 6.1 10*3/uL (ref 4.5–10.5)

## 2011-10-03 MED ORDER — AMOXICILLIN-POT CLAVULANATE 875-125 MG PO TABS: 1.0000 | ORAL_TABLET | Freq: Two times a day (BID) | ORAL | Status: AC

## 2011-10-03 NOTE — Patient Instructions (Signed)
Lymphadenopathy Lymphadenopathy means "disease of the lymph glands." But the term is usually used to describe swollen or enlarged lymph glands, also called lymph nodes. These are the bean-shaped organs found in many locations including the neck, underarm, and groin. Lymph glands are part of the immune system, which fights infections in your body. Lymphadenopathy can occur in just one area of the body, such as the neck, or it can be generalized, with lymph node enlargement in several areas. The nodes found in the neck are the most common sites of lymphadenopathy. CAUSES  When your immune system responds to germs (such as viruses or bacteria ), infection-fighting cells and fluid build up. This causes the glands to grow in size. This is usually not something to worry about. Sometimes, the glands themselves can become infected and inflamed. This is called lymphadenitis. Enlarged lymph nodes can be caused by many diseases:  Bacterial disease, such as strep throat or a skin infection.   Viral disease, such as a common cold.   Other germs, such as lyme disease, tuberculosis, or sexually transmitted diseases.   Cancers, such as lymphoma (cancer of the lymphatic system) or leukemia (cancer of the white blood cells).   Inflammatory diseases such as lupus or rheumatoid arthritis.   Reactions to medications.  Many of the diseases above are rare, but important. This is why you should see your caregiver if you have lymphadenopathy. SYMPTOMS   Swollen, enlarged lumps in the neck, back of the head or other locations.   Tenderness.   Warmth or redness of the skin over the lymph nodes.   Fever.  DIAGNOSIS  Enlarged lymph nodes are often near the source of infection. They can help healthcare providers diagnose your illness. For instance:   Swollen lymph nodes around the jaw might be caused by an infection in the mouth.   Enlarged glands in the neck often signal a throat infection.   Lymph nodes that  are swollen in more than one area often indicate an illness caused by a virus.  Your caregiver most likely will know what is causing your lymphadenopathy after listening to your history and examining you. Blood tests, x-rays or other tests may be needed. If the cause of the enlarged lymph node cannot be found, and it does not go away by itself, then a biopsy may be needed. Your caregiver will discuss this with you. TREATMENT  Treatment for your enlarged lymph nodes will depend on the cause. Many times the nodes will shrink to normal size by themselves, with no treatment. Antibiotics or other medicines may be needed for infection. Only take over-the-counter or prescription medicines for pain, discomfort or fever as directed by your caregiver. HOME CARE INSTRUCTIONS  Swollen lymph glands usually return to normal when the underlying medical condition goes away. If they persist, contact your health-care provider. He/she might prescribe antibiotics or other treatments, depending on the diagnosis. Take any medications exactly as prescribed. Keep any follow-up appointments made to check on the condition of your enlarged nodes.  SEEK MEDICAL CARE IF:   Swelling lasts for more than two weeks.   You have symptoms such as weight loss, night sweats, fatigue or fever that does not go away.   The lymph nodes are hard, seem fixed to the skin or are growing rapidly.   Skin over the lymph nodes is red and inflamed. This could mean there is an infection.  SEEK IMMEDIATE MEDICAL CARE IF:   Fluid starts leaking from the area of the   enlarged lymph node.   You develop a fever of 102 F (38.9 C) or greater.   Severe pain develops (not necessarily at the site of a large lymph node).   You develop chest pain or shortness of breath.   You develop worsening abdominal pain.  MAKE SURE YOU:   Understand these instructions.   Will watch your condition.   Will get help right away if you are not doing well or get  worse.  Document Released: 06/13/2008 Document Revised: 05/17/2011 Document Reviewed: 06/13/2008 [employer] Patient Information 2012 [employer]. 

## 2011-10-03 NOTE — Progress Notes (Signed)
  Subjective:    Patient ID: Heather [name], female    DOB: Apr 13, 1956, 56 y.o.   MRN: [id number]  HPI  56 yo here for right sided neck tenderness.  Had URI symptoms last week, those have resolved.  Now has very tender lump on side of neck.  No fever, no chills. Not warm to touch that she is aware of.  Non smoker.  Patient Active Problem List  Diagnoses  . HYPERLIPIDEMIA-MIXED  . HYPERTENSION, UNSPECIFIED  . CHEST PAIN  . Hyperlipidemia  . Hypertension  . Chronic kidney disease  . B12 deficiency  . Routine general medical examination at a health care facility  . Vaginal atrophy  . Lymphadenitis   Past Medical History  Diagnosis Date  . Hyperlipidemia   . Hypertension   . Chronic kidney disease   . B12 deficiency    No past surgical history on file. History  Substance Use Topics  . Smoking status: Never Smoker   . Smokeless tobacco: Not on file  . Alcohol Use: Not on file   Family History  Problem Relation Age of Onset  . Hypertension Mother   . COPD Father   . Heart disease Father     MI at 69 yo   No Known Allergies Current Outpatient Prescriptions on File Prior to Visit  Medication Sig Dispense Refill  . acetaminophen (TYLENOL) 325 MG tablet Take 650 mg by mouth every 6 (six) hours as needed.        Marland Kitchen atenolol (TENORMIN) 25 MG tablet Take 1 tablet (25 mg total) by mouth daily.  90 tablet  3  . DULoxetine (CYMBALTA) 30 MG capsule Take 30 mg by mouth 3 (three) times daily.        Marland Kitchen omeprazole (PRILOSEC) 20 MG capsule Take 1 capsule (20 mg total) by mouth 2 (two) times daily.  60 capsule  6  . valsartan-hydrochlorothiazide (DIOVAN-HCT) 80-12.5 MG per tablet Take 1 tablet by mouth daily.  90 tablet  3   The PMH, PSH, Social History, Family History, Medications, and allergies have been reviewed in [facility], and have been updated if relevant.   Review of Systems  Constitutional: Negative for unexpected weight change.  HENT: Negative for drooling, trouble  swallowing, neck stiffness and dental problem.        Objective:   Physical Exam  Constitutional: She appears well-developed and well-nourished.  HENT:  Head: Normocephalic.  Eyes: Pupils are equal, round, and reactive to light.  Cardiovascular: Normal rate and regular rhythm.   Pulmonary/Chest: Effort normal and breath sounds normal.  Lymphadenopathy:       Head (right side): Submandibular adenopathy present.    She has cervical adenopathy.       Right cervical: Deep cervical adenopathy present.       TTP, no warmth or erythema.  Skin: Skin is warm and dry.          Assessment & Plan:   1. Lymphadenitis  amoxicillin-clavulanate (AUGMENTIN) 875-125 MG per tablet, CBC w/Diff   New- likely due to recent infection. Will treat with Augmentin, order CBC for further evaluation. The patient indicates understanding of these issues and agrees with the plan. See pt instructions for further details.

## 2011-10-05 ENCOUNTER — Encounter: Payer: Self-pay | Admitting: *Deleted

## 2011-11-13 ENCOUNTER — Encounter: Payer: Self-pay | Admitting: Family Medicine

## 2011-11-13 ENCOUNTER — Ambulatory Visit (INDEPENDENT_AMBULATORY_CARE_PROVIDER_SITE_OTHER): Payer: BC Managed Care – PPO | Admitting: Family Medicine

## 2011-11-13 VITALS — BP 160/104 | HR 76 | Temp 98.5°F | Wt 144.0 lb

## 2011-11-13 DIAGNOSIS — J019 Acute sinusitis, unspecified: Secondary | ICD-10-CM

## 2011-11-13 MED ORDER — AMOXICILLIN-POT CLAVULANATE 875-125 MG PO TABS: 1.0000 | ORAL_TABLET | Freq: Two times a day (BID) | ORAL | Status: AC

## 2011-11-13 MED ORDER — PROMETHAZINE HCL 25 MG PO TABS: 25.0000 mg | ORAL_TABLET | Freq: Three times a day (TID) | ORAL | Status: AC | PRN

## 2011-11-13 NOTE — Assessment & Plan Note (Signed)
Acute right sided frontal sinusitis. Discussed possible viral nature of illness, however significant sxs. Will provide augmentin wasp script in case any worsening or not improved after expected course of illness. Advised to update [place] if changing sxs. Pt agrees with plan. Phenergan for nausea.

## 2011-11-13 NOTE — Patient Instructions (Signed)
You have a sinus infection. Push fluids and plenty of rest. Nasal saline irrigation or neti pot to help drain sinuses. May use simple mucinex with plenty of fluid to help mobilize mucous. Let us know if fever >101.5, trouble opening/closing mouth, difficulty swallowing, or worsening - you may need to be seen again. If any worsening or symptoms going on past 9-10 days, fill antibiotic.  call us with questions.  I hope you start feeling better!

## 2011-11-13 NOTE — Progress Notes (Signed)
  Subjective:    Patient ID: Heather [name], female    DOB: Aug 18, 1956, 56 y.o.   MRN: [id number]  HPI CC: not feeling well  3d h/o feeling ill.  Eyes watery, head stopped up, eye and head pain.  Fever Tmax 101.  Body aches.  Feeling somewhat nauseated.  HA described as frontal pressure.  + sneezing.  Significant sinus congestion.  + nausea.   Using tylenol to control fever.    No abd pain, vomiting, diarrhea, rashes, ear pain, tooth pain.  No coughing or PNDrainage.  No sob, CP.  Granddaughter with diarrhea last week.  No smokers at home.  No h/o asthma.  Lab Results  Component Value Date   CREATININE 1.3* 01/13/2011   Review of Systems Per HPI    Objective:   Physical Exam  Nursing note and vitals reviewed. Constitutional: She appears well-developed and well-nourished. No distress.  HENT:  Head: Normocephalic and atraumatic.  Right Ear: Hearing, tympanic membrane, external ear and ear canal normal.  Left Ear: Hearing, tympanic membrane, external ear and ear canal normal.  Nose: Mucosal edema present. No rhinorrhea. Right sinus exhibits maxillary sinus tenderness and frontal sinus tenderness. Left sinus exhibits no maxillary sinus tenderness and no frontal sinus tenderness.  Mouth/Throat: Uvula is midline, oropharynx is clear and moist and mucous membranes are normal. No oropharyngeal exudate, posterior oropharyngeal edema, posterior oropharyngeal erythema or tonsillar abscesses.       Cerumen covering TMs bilaterally but able to see pearly grey TMs bilaterally turbinate swelling.  Eyes: Conjunctivae and EOM are normal. Pupils are equal, round, and reactive to light. No scleral icterus.  Neck: Normal range of motion. Neck supple.  Cardiovascular: Normal rate, regular rhythm, normal heart sounds and intact distal pulses.   No murmur heard. Pulmonary/Chest: Effort normal and breath sounds normal. No respiratory distress. She has no wheezes. She has no rales.  Lymphadenopathy:   She has no cervical adenopathy.  Skin: Skin is warm and dry. No rash noted.       Assessment & Plan:

## 2012-05-16 ENCOUNTER — Other Ambulatory Visit: Payer: Self-pay | Admitting: Family Medicine

## 2012-07-25 ENCOUNTER — Encounter: Payer: Self-pay | Admitting: Family Medicine

## 2012-07-25 ENCOUNTER — Ambulatory Visit (INDEPENDENT_AMBULATORY_CARE_PROVIDER_SITE_OTHER): Payer: BC Managed Care – PPO | Admitting: Family Medicine

## 2012-07-25 VITALS — BP 140/90 | HR 68 | Temp 98.3°F | Wt 130.0 lb

## 2012-07-25 DIAGNOSIS — E538 Deficiency of other specified B group vitamins: Secondary | ICD-10-CM

## 2012-07-25 DIAGNOSIS — I10 Essential (primary) hypertension: Secondary | ICD-10-CM

## 2012-07-25 DIAGNOSIS — F329 Major depressive disorder, single episode, unspecified: Secondary | ICD-10-CM

## 2012-07-25 DIAGNOSIS — E785 Hyperlipidemia, unspecified: Secondary | ICD-10-CM

## 2012-07-25 LAB — LIPID PANEL
Cholesterol: 242 mg/dL — ABNORMAL HIGH (ref 0–200)
Total CHOL/HDL Ratio: 6

## 2012-07-25 LAB — COMPREHENSIVE METABOLIC PANEL
Albumin: 3.8 g/dL (ref 3.5–5.2)
BUN: 19 mg/dL (ref 6–23)
CO2: 29 mEq/L (ref 19–32)
Calcium: 8.8 mg/dL (ref 8.4–10.5)
Chloride: 102 mEq/L (ref 96–112)
GFR: 43.15 mL/min — ABNORMAL LOW (ref >60.00)
Glucose, Bld: 106 mg/dL — ABNORMAL HIGH (ref 70–99)
Potassium: 3.7 mEq/L (ref 3.5–5.1)
Sodium: 139 mEq/L (ref 135–145)
Total Protein: 7.2 g/dL (ref 6.0–8.3)

## 2012-07-25 MED ORDER — OMEPRAZOLE 20 MG PO CPDR: 20.0000 mg | DELAYED_RELEASE_CAPSULE | Freq: Two times a day (BID) | ORAL | Status: DC

## 2012-07-25 MED ORDER — VALSARTAN-HYDROCHLOROTHIAZIDE 80-12.5 MG PO TABS: ORAL_TABLET | ORAL | Status: DC

## 2012-07-25 MED ORDER — DULOXETINE HCL 30 MG PO CPEP: 30.0000 mg | ORAL_CAPSULE | Freq: Three times a day (TID) | ORAL | Status: DC

## 2012-07-25 MED ORDER — ATENOLOL 25 MG PO TABS: ORAL_TABLET | ORAL | Status: DC

## 2012-07-25 NOTE — Patient Instructions (Addendum)
Good to see you. We will call you with your lab results.   

## 2012-07-25 NOTE — Progress Notes (Signed)
56 yo very pleasant female here to refill meds.  Has not been seen for routine care since 12/2010.   HLD- has h/o elevated lipids,  Father had MI at 27 yo. Was prescribed Simvastatin 20 mg qhs years ago but stopped taking it.  No reported side effect, just kept forgetting. I advised her to restart in it 12/2010- lost to follow up.  She did not restart it. Lab Results  Component Value Date   CHOL 213* 01/13/2011   HDL 44.80 01/13/2011   LDLDIRECT 148.5 01/13/2011   TRIG 86.0 01/13/2011   CHOLHDL 5 01/13/2011     HTN- has had issues with HTN since she was in her 30s.  Currently taking Diovan/HCTZ and atenolol.  Denies any HA, blurred vision or LE edema.  At times does have leg cramps or "restless legs" at night.  H/o chronic renal insufficiency-  Lab Results  Component Value Date   CREATININE 1.3* 01/13/2011    Cr has been stable between 1.3-1.4 according to pt.  Depression- stable on cymbalta.  Denies any symptoms of anxiety or depression.  No SI or HI.   Patient Active Problem List  Diagnosis  . HYPERLIPIDEMIA-MIXED  . HYPERTENSION, UNSPECIFIED  . CHEST PAIN  . Hyperlipidemia  . Hypertension  . Chronic kidney disease  . B12 deficiency  . Routine general medical examination at a health care facility  . Vaginal atrophy  . Lymphadenitis  . Sinusitis acute  . Depression   Past Medical History  Diagnosis Date  . Hyperlipidemia   . Hypertension     had ankle swelling with amlodipine  . Chronic kidney disease   . B12 deficiency   . Depression   . GERD (gastroesophageal reflux disease)   . RLS (restless legs syndrome)    Past Surgical History  Procedure Date  . Salpingoophorectomy     right; laproscopic   History  Substance Use Topics  . Smoking status: Never Smoker   . Smokeless tobacco: Not on file  . Alcohol Use: Not on file   Family History  Problem Relation Age of Onset  . Hypertension Mother   . COPD Father   . Heart attack Father 14    developed ischemic  cardiomyopathy  . Heart attack Mother 9    sudden cardiac death   No Known Allergies Current Outpatient Prescriptions on File Prior to Visit  Medication Sig Dispense Refill  . acetaminophen (TYLENOL) 325 MG tablet Take 650 mg by mouth every 6 (six) hours as needed.        Marland Kitchen atenolol (TENORMIN) 25 MG tablet TAKE 1 TABLET DAILY  90 tablet  0  . DULoxetine (CYMBALTA) 30 MG capsule Take 30 mg by mouth 3 (three) times daily.        Marland Kitchen omeprazole (PRILOSEC) 20 MG capsule Take 1 capsule (20 mg total) by mouth 2 (two) times daily.  60 capsule  6  . valsartan-hydrochlorothiazide (DIOVAN-HCT) 80-12.5 MG per tablet TAKE 1 TABLET DAILY  90 tablet  0    The PMH, PSH, Social History, Family History, Medications, and allergies have been reviewed in [facility], and have been updated if relevant.  ROS: See HPI  Physical Exam: BP 140/90  Pulse 68  Temp 98.3 F (36.8 C)  Wt 130 lb (58.968 kg) BP Readings from Last 3 Encounters:  07/25/12 140/90  11/13/11 160/104  10/03/11 110/80    General:  Well-developed,well-nourished,in no acute distress; alert,appropriate and cooperative throughout examination Head:  normocephalic and atraumatic.   Eyes:  vision grossly intact, pupils equal, pupils round, and pupils reactive to light.   Ears:  R ear normal and L ear normal.   Nose:  no external deformity.   Mouth:  good dentition.   Lungs:  Normal respiratory effort, chest expands symmetrically. Lungs are clear to auscultation, no crackles or wheezes. Heart:  Normal rate and regular rhythm. S1 and S2 normal without gallop, murmur, click, rub or other extra sounds. Abdomen:  Bowel sounds positive,abdomen soft and non-tender without masses, organomegaly or hernias noted. Msk:  No deformity or scoliosis noted of thoracic or lumbar spine.   Extremities:  No clubbing, cyanosis, edema, or deformity noted with normal full range of motion of all joints.   Neurologic:  alert & oriented X3 and gait normal.   Skin:   Intact without suspicious lesions or rashes Psych:  Cognition and judgment appear intact. Alert and cooperative with normal attention span and concentration. No apparent delusions, illusions, hallucinations  Assessment and Plan: 1. Hyperlipidemia  Recheck lipid panel today.  I did strongly urge her again that if elevated to restart it given her family and personal history.  The patient indicates understanding of these issues and agrees with the plan.  Lipid Panel  2. Hypertension  Stable on current meds.  Check CMET today. Meds refilled. Comprehensive metabolic panel  3. Depression  Stable on Cymbalta.  Refilled today.   4. B12 deficiency  Vitamin B12

## 2012-07-26 MED ORDER — SIMVASTATIN 20 MG PO TABS: 20.0000 mg | ORAL_TABLET | Freq: Every evening | ORAL | Status: DC

## 2012-07-26 NOTE — Addendum Note (Signed)
Addended by: Eliezer Bottom on: 07/26/2012 04:54 PM   Modules accepted: Orders

## 2012-08-02 ENCOUNTER — Encounter: Payer: Self-pay | Admitting: Family Medicine

## 2012-08-02 NOTE — Telephone Encounter (Signed)
Prior auth given for omeprazole.  Approval letter placed on doctor's desk for signature and scanning.  Advised patient.

## 2012-08-02 NOTE — Telephone Encounter (Signed)
Form faxed back to [employer]. 

## 2012-08-02 NOTE — Telephone Encounter (Signed)
Quantity override prior Berkley Harvey is needed for omeprazole, since patient is to take twice a day.  From is on your desk.

## 2012-08-08 ENCOUNTER — Ambulatory Visit: Payer: BC Managed Care – PPO

## 2012-08-08 ENCOUNTER — Ambulatory Visit (INDEPENDENT_AMBULATORY_CARE_PROVIDER_SITE_OTHER): Payer: BC Managed Care – PPO | Admitting: *Deleted

## 2012-08-08 DIAGNOSIS — E538 Deficiency of other specified B group vitamins: Secondary | ICD-10-CM

## 2012-08-08 MED ORDER — CYANOCOBALAMIN 1000 MCG/ML IJ SOLN
1000.0000 ug | Freq: Once | INTRAMUSCULAR | Status: AC
  Administered 2012-08-08: 1000 ug via INTRAMUSCULAR

## 2012-09-10 ENCOUNTER — Ambulatory Visit: Payer: BC Managed Care – PPO

## 2012-09-12 ENCOUNTER — Ambulatory Visit (INDEPENDENT_AMBULATORY_CARE_PROVIDER_SITE_OTHER): Payer: BC Managed Care – PPO | Admitting: *Deleted

## 2012-09-12 DIAGNOSIS — E538 Deficiency of other specified B group vitamins: Secondary | ICD-10-CM

## 2012-09-12 MED ORDER — CYANOCOBALAMIN 1000 MCG/ML IJ SOLN
1000.0000 ug | Freq: Once | INTRAMUSCULAR | Status: AC
  Administered 2012-09-12: 1000 ug via INTRAMUSCULAR

## 2012-09-24 ENCOUNTER — Other Ambulatory Visit (INDEPENDENT_AMBULATORY_CARE_PROVIDER_SITE_OTHER): Payer: BC Managed Care – PPO

## 2012-09-24 DIAGNOSIS — E785 Hyperlipidemia, unspecified: Secondary | ICD-10-CM

## 2012-09-24 LAB — COMPREHENSIVE METABOLIC PANEL
ALT: 15 U/L (ref 0–35)
CO2: 32 mEq/L (ref 19–32)
Calcium: 9 mg/dL (ref 8.4–10.5)
Chloride: 102 mEq/L (ref 96–112)
Creatinine, Ser: 1.3 mg/dL — ABNORMAL HIGH (ref 0.4–1.2)
GFR: 43.88 mL/min — ABNORMAL LOW (ref >60.00)
Sodium: 141 mEq/L (ref 135–145)
Total Protein: 6.9 g/dL (ref 6.0–8.3)

## 2012-09-24 LAB — LIPID PANEL
Total CHOL/HDL Ratio: 4
Triglycerides: 80 mg/dL (ref 0.0–149.0)

## 2012-09-27 ENCOUNTER — Encounter: Payer: Self-pay | Admitting: *Deleted

## 2012-10-17 ENCOUNTER — Ambulatory Visit: Payer: BC Managed Care – PPO

## 2012-10-31 ENCOUNTER — Ambulatory Visit: Payer: BC Managed Care – PPO

## 2012-11-07 ENCOUNTER — Ambulatory Visit (INDEPENDENT_AMBULATORY_CARE_PROVIDER_SITE_OTHER): Payer: BC Managed Care – PPO | Admitting: *Deleted

## 2012-11-07 DIAGNOSIS — E538 Deficiency of other specified B group vitamins: Secondary | ICD-10-CM

## 2012-11-07 MED ORDER — CYANOCOBALAMIN 1000 MCG/ML IJ SOLN
1000.0000 ug | Freq: Once | INTRAMUSCULAR | Status: AC
  Administered 2012-11-07: 1000 ug via INTRAMUSCULAR

## 2012-12-05 ENCOUNTER — Ambulatory Visit (INDEPENDENT_AMBULATORY_CARE_PROVIDER_SITE_OTHER): Payer: BC Managed Care – PPO | Admitting: *Deleted

## 2012-12-05 DIAGNOSIS — E538 Deficiency of other specified B group vitamins: Secondary | ICD-10-CM

## 2012-12-05 MED ORDER — CYANOCOBALAMIN 1000 MCG/ML IJ SOLN
1000.0000 ug | Freq: Once | INTRAMUSCULAR | Status: AC
  Administered 2012-12-05: 1000 ug via INTRAMUSCULAR

## 2012-12-07 ENCOUNTER — Other Ambulatory Visit: Payer: Self-pay | Admitting: Family Medicine

## 2013-01-09 ENCOUNTER — Ambulatory Visit (INDEPENDENT_AMBULATORY_CARE_PROVIDER_SITE_OTHER): Payer: BC Managed Care – PPO | Admitting: *Deleted

## 2013-01-09 DIAGNOSIS — E538 Deficiency of other specified B group vitamins: Secondary | ICD-10-CM

## 2013-01-09 MED ORDER — CYANOCOBALAMIN 1000 MCG/ML IJ SOLN
1000.0000 ug | Freq: Once | INTRAMUSCULAR | Status: AC
  Administered 2013-01-09: 1000 ug via INTRAMUSCULAR

## 2013-01-21 ENCOUNTER — Ambulatory Visit (INDEPENDENT_AMBULATORY_CARE_PROVIDER_SITE_OTHER): Payer: BC Managed Care – PPO | Admitting: Family Medicine

## 2013-01-21 ENCOUNTER — Encounter: Payer: Self-pay | Admitting: Family Medicine

## 2013-01-21 ENCOUNTER — Ambulatory Visit (INDEPENDENT_AMBULATORY_CARE_PROVIDER_SITE_OTHER)
Admission: RE | Admit: 2013-01-21 | Discharge: 2013-01-21 | Disposition: A | Discharge status: Home / Self Care | Payer: BC Managed Care – PPO | Source: Ambulatory Visit | Attending: Family Medicine | Admitting: Family Medicine

## 2013-01-21 VITALS — BP 114/78 | HR 84 | Temp 98.6°F | Wt 139.0 lb

## 2013-01-21 DIAGNOSIS — E785 Hyperlipidemia, unspecified: Secondary | ICD-10-CM

## 2013-01-21 DIAGNOSIS — F329 Major depressive disorder, single episode, unspecified: Secondary | ICD-10-CM

## 2013-01-21 DIAGNOSIS — I10 Essential (primary) hypertension: Secondary | ICD-10-CM

## 2013-01-21 DIAGNOSIS — N189 Chronic kidney disease, unspecified: Secondary | ICD-10-CM

## 2013-01-21 DIAGNOSIS — R5383 Other fatigue: Secondary | ICD-10-CM

## 2013-01-21 DIAGNOSIS — M542 Cervicalgia: Secondary | ICD-10-CM

## 2013-01-21 DIAGNOSIS — R5381 Other malaise: Secondary | ICD-10-CM

## 2013-01-21 DIAGNOSIS — E538 Deficiency of other specified B group vitamins: Secondary | ICD-10-CM

## 2013-01-21 LAB — CBC WITH DIFFERENTIAL/PLATELET
Basophils Absolute: 0.1 10*3/uL (ref 0.0–0.1)
Lymphocytes Relative: 27.9 % (ref 12.0–46.0)
Monocytes Relative: 4.1 % (ref 3.0–12.0)
Neutrophils Relative %: 65.3 % (ref 43.0–77.0)
Platelets: 228 10*3/uL (ref 150.0–400.0)
RDW: 13.6 % (ref 11.5–14.6)

## 2013-01-21 LAB — LIPID PANEL
HDL: 51.4 mg/dL (ref >39.00)
VLDL: 14.4 mg/dL (ref 0.0–40.0)

## 2013-01-21 LAB — COMPREHENSIVE METABOLIC PANEL
ALT: 37 U/L — ABNORMAL HIGH (ref 0–35)
AST: 32 U/L (ref 0–37)
Alkaline Phosphatase: 88 U/L (ref 39–117)
Creatinine, Ser: 1.4 mg/dL — ABNORMAL HIGH (ref 0.4–1.2)
Total Bilirubin: 1.1 mg/dL (ref 0.3–1.2)

## 2013-01-21 LAB — TSH: TSH: 0.95 u[IU]/mL (ref 0.35–5.50)

## 2013-01-21 LAB — VITAMIN B12: Vitamin B-12: 377 pg/mL (ref 211–911)

## 2013-01-21 NOTE — Patient Instructions (Addendum)
Good to see you. We will call your xray and lab results tomorrow.

## 2013-01-21 NOTE — Progress Notes (Signed)
57 yo very pleasant female here for 6 month follow up.   HLD- has h/o elevated lipids,  Father had MI at 68 yo. On Zocor 20 mg daily.  Lab Results  Component Value Date   CHOL 174 09/24/2012   HDL 47.00 09/24/2012   LDLCALC 111* 09/24/2012   LDLDIRECT 170.9 07/25/2012   TRIG 80.0 09/24/2012   CHOLHDL 4 09/24/2012     HTN- has had issues with HTN since she was in her 30s.  Currently taking Diovan/HCTZ and atenolol.  Denies any HA, blurred vision or LE edema.  At times does have leg cramps or "restless legs" at night.   H/o chronic renal insufficiency-  Lab Results  Component Value Date   CREATININE 1.3* 09/24/2012    Cr has been stable between 1.3-1.4 according to pt.  Depression- stable on cymbalta.  Denies any symptoms of anxiety or depression.  No SI or HI.  She has been quite fatigued lately.  She is sleeping well.  No CP or DOE.  No LE edema.  Neck pain- ongoing for 1 month.  Daily.  Feels like she was hit by something but there was no injury. No UE radiculopathy.   Patient Active Problem List   Diagnosis Date Noted  . Depression 07/25/2012  . Sinusitis acute 11/13/2011  . Lymphadenitis 10/03/2011  . Routine general medical examination at a health care facility 01/13/2011  . Vaginal atrophy 01/13/2011  . Hyperlipidemia   . Hypertension   . Chronic kidney disease   . B12 deficiency   . HYPERLIPIDEMIA-MIXED 07/19/2010  . HYPERTENSION, UNSPECIFIED 07/19/2010  . CHEST PAIN 07/19/2010   Past Medical History  Diagnosis Date  . Hyperlipidemia   . Hypertension     had ankle swelling with amlodipine  . Chronic kidney disease   . B12 deficiency   . Depression   . GERD (gastroesophageal reflux disease)   . RLS (restless legs syndrome)    Past Surgical History  Procedure Laterality Date  . Salpingoophorectomy      right; laproscopic   History  Substance Use Topics  . Smoking status: Never Smoker   . Smokeless tobacco: Never Used  . Alcohol Use: No   Family History   Problem Relation Age of Onset  . Hypertension Mother   . COPD Father   . Heart attack Father 66    developed ischemic cardiomyopathy  . Heart attack Mother 26    sudden cardiac death   No Known Allergies Current Outpatient Prescriptions on File Prior to Visit  Medication Sig Dispense Refill  . acetaminophen (TYLENOL) 325 MG tablet Take 650 mg by mouth every 6 (six) hours as needed.        Marland Kitchen atenolol (TENORMIN) 25 MG tablet Take one by mouth daily  90 tablet  1  . DULoxetine (CYMBALTA) 30 MG capsule TAKE 1 CAPSULE THREE TIMES A DAY  270 capsule  0  . omeprazole (PRILOSEC) 20 MG capsule Take 1 capsule (20 mg total) by mouth 2 (two) times daily.  180 capsule  1  . simvastatin (ZOCOR) 20 MG tablet Take 1 tablet (20 mg total) by mouth every evening.  90 tablet  0  . valsartan-hydrochlorothiazide (DIOVAN-HCT) 80-12.5 MG per tablet Take one by mouth daily  90 tablet  1   No current facility-administered medications on file prior to visit.    The PMH, PSH, Social History, Family History, Medications, and allergies have been reviewed in [facility], and have been updated if relevant.  ROS: See  HPI  Physical Exam: BP 114/78  Pulse 84  Temp(Src) 98.6 F (37 C) (Oral)  Wt 139 lb (63.05 kg)  BMI 22.45 kg/m2 BP Readings from Last 3 Encounters:  01/21/13 114/78  07/25/12 140/90  11/13/11 160/104    General:  Well-developed,well-nourished,in no acute distress; alert,appropriate and cooperative throughout examination Head:  normocephalic and atraumatic.   Eyes:  vision grossly intact, pupils equal, pupils round, and pupils reactive to light.   Ears:  R ear normal and L ear normal.   Nose:  no external deformity.   Mouth:  good dentition.   Lungs:  Normal respiratory effort, chest expands symmetrically. Lungs are clear to auscultation, no crackles or wheezes. Heart:  Normal rate and regular rhythm. S1 and S2 normal without gallop, murmur, click, rub or other extra sounds. Abdomen:  Bowel  sounds positive,abdomen soft and non-tender without masses, organomegaly or hernias noted. Msk:   TTP over cervical spine Extremities:  No clubbing, cyanosis, edema, or deformity noted with normal full range of motion of all joints.   Neurologic:  alert & oriented X3 and gait normal.   Grip strength normal bilaterally Skin:  Intact without suspicious lesions or rashes Psych:  Cognition and judgment appear intact. Alert and cooperative with normal attention span and concentration. No apparent delusions, illusions, hallucinations  Assessment and Plan: 1. B12 deficiency Recheck B12 today.  2. Chronic kidney disease Recheck Cr today.  3. Depression Stable on current dose of Cymbalta.  4. HYPERLIPIDEMIA-MIXED She is taking Zocor 20 mg daily. Recheck lipids today.  5. HYPERTENSION, UNSPECIFIED Well controlled on Diovan- HCTZ.  No changes.  6.  Fatigue- Likely multifactorial.  Check labs today.  7.  Neck pain- Given bony tenderness, will order xray to day to rule out compression fracture or other pathology. The patient indicates understanding of these issues and agrees with the plan.

## 2013-01-22 LAB — VITAMIN D 25 HYDROXY (VIT D DEFICIENCY, FRACTURES): Vit D, 25-Hydroxy: 33 ng/mL (ref 30–89)

## 2013-01-22 LAB — LDL CHOLESTEROL, DIRECT: Direct LDL: 177.8 mg/dL

## 2013-02-12 ENCOUNTER — Encounter: Payer: Self-pay | Admitting: Family Medicine

## 2013-02-12 MED ORDER — SIMVASTATIN 20 MG PO TABS: 20.0000 mg | ORAL_TABLET | Freq: Every evening | ORAL | Status: DC

## 2013-02-19 ENCOUNTER — Ambulatory Visit (INDEPENDENT_AMBULATORY_CARE_PROVIDER_SITE_OTHER): Payer: BC Managed Care – PPO | Admitting: Family Medicine

## 2013-02-19 ENCOUNTER — Encounter: Payer: Self-pay | Admitting: Family Medicine

## 2013-02-19 VITALS — BP 116/84 | HR 68 | Temp 98.1°F | Wt 141.5 lb

## 2013-02-19 DIAGNOSIS — R350 Frequency of micturition: Secondary | ICD-10-CM

## 2013-02-19 LAB — POCT URINALYSIS DIPSTICK
Ketones, UA: NEGATIVE
Nitrite, UA: POSITIVE
pH, UA: 7.5

## 2013-02-19 MED ORDER — CIPROFLOXACIN HCL 250 MG PO TABS: 250.0000 mg | ORAL_TABLET | Freq: Two times a day (BID) | ORAL | Status: DC

## 2013-02-19 NOTE — Patient Instructions (Signed)
Good to see you. You do have a urinary tract infection. Take cipro as directed- 1 tablet twice daily x 5 days.

## 2013-02-19 NOTE — Progress Notes (Signed)
SUBJECTIVE: Heather [name] is a 57 y.o. female who complains of urinary frequency, urgency, urine odor, and dysuria x 7 days, without flank pain, fever, chills, or abnormal vaginal discharge or bleeding.   Patient Active Problem List   Diagnosis Date Noted  . Depression 07/25/2012  . Vaginal atrophy 01/13/2011  . Chronic kidney disease   . B12 deficiency   . HYPERLIPIDEMIA-MIXED 07/19/2010  . HYPERTENSION, UNSPECIFIED 07/19/2010  . CHEST PAIN 07/19/2010   Past Medical History  Diagnosis Date  . Hyperlipidemia   . Hypertension     had ankle swelling with amlodipine  . Chronic kidney disease   . B12 deficiency   . Depression   . GERD (gastroesophageal reflux disease)   . RLS (restless legs syndrome)    Past Surgical History  Procedure Laterality Date  . Salpingoophorectomy      right; laproscopic   History  Substance Use Topics  . Smoking status: Never Smoker   . Smokeless tobacco: Never Used  . Alcohol Use: No   Family History  Problem Relation Age of Onset  . Hypertension Mother   . COPD Father   . Heart attack Father 55    developed ischemic cardiomyopathy  . Heart attack Mother 43    sudden cardiac death   No Known Allergies Current Outpatient Prescriptions on File Prior to Visit  Medication Sig Dispense Refill  . acetaminophen (TYLENOL) 325 MG tablet Take 650 mg by mouth every 6 (six) hours as needed.        Marland Kitchen atenolol (TENORMIN) 25 MG tablet Take one by mouth daily  90 tablet  1  . DULoxetine (CYMBALTA) 30 MG capsule TAKE 1 CAPSULE THREE TIMES A DAY  270 capsule  0  . omeprazole (PRILOSEC) 20 MG capsule Take 1 capsule (20 mg total) by mouth 2 (two) times daily.  180 capsule  1  . simvastatin (ZOCOR) 20 MG tablet Take 1 tablet (20 mg total) by mouth every evening.  90 tablet  1  . valsartan-hydrochlorothiazide (DIOVAN-HCT) 80-12.5 MG per tablet Take one by mouth daily  90 tablet  1   No current facility-administered medications on file prior to visit.   The  PMH, PSH, Social History, Family History, Medications, and allergies have been reviewed in [facility], and have been updated if relevant.  OBJECTIVE: Appears well, in no apparent distress.  Vital signs are normal. The abdomen is soft without tenderness, guarding, mass, rebound or organomegaly. No CVA tenderness or inguinal adenopathy noted. Urine dipstick shows positive for WBC's, positive for RBC's, positive for nitrates and positive for leukocytes.    ASSESSMENT: UTI uncomplicated without evidence of pyelonephritis  PLAN: Treatment per orders - cipro 250 mg twice daily x 5 days, send urine for cx, also push fluids, may use Pyridium OTC prn. Call or return to clinic prn if these symptoms worsen or fail to improve as anticipated.

## 2013-02-19 NOTE — Addendum Note (Signed)
Addended by: Alvina Chou on: 02/19/2013 01:11 PM   Modules accepted: Orders

## 2013-02-22 LAB — URINE CULTURE: Colony Count: >=100000

## 2013-02-26 ENCOUNTER — Other Ambulatory Visit: Payer: Self-pay | Admitting: Family Medicine

## 2013-03-26 ENCOUNTER — Other Ambulatory Visit: Payer: Self-pay | Admitting: Family Medicine

## 2013-03-27 ENCOUNTER — Other Ambulatory Visit: Payer: BC Managed Care – PPO

## 2013-04-01 ENCOUNTER — Encounter: Payer: Self-pay | Admitting: Family Medicine

## 2013-04-01 ENCOUNTER — Other Ambulatory Visit (INDEPENDENT_AMBULATORY_CARE_PROVIDER_SITE_OTHER): Payer: BC Managed Care – PPO

## 2013-04-01 DIAGNOSIS — E785 Hyperlipidemia, unspecified: Secondary | ICD-10-CM

## 2013-04-01 DIAGNOSIS — I10 Essential (primary) hypertension: Secondary | ICD-10-CM

## 2013-04-01 LAB — COMPREHENSIVE METABOLIC PANEL
ALT: 22 U/L (ref 0–35)
Albumin: 3.5 g/dL (ref 3.5–5.2)
Alkaline Phosphatase: 88 U/L (ref 39–117)
Glucose, Bld: 100 mg/dL — ABNORMAL HIGH (ref 70–99)
Potassium: 3.9 mEq/L (ref 3.5–5.1)
Sodium: 142 mEq/L (ref 135–145)
Total Protein: 7 g/dL (ref 6.0–8.3)

## 2013-04-01 LAB — LIPID PANEL
Total CHOL/HDL Ratio: 4
VLDL: 22.6 mg/dL (ref 0.0–40.0)

## 2013-04-02 ENCOUNTER — Other Ambulatory Visit: Payer: Self-pay | Admitting: Family Medicine

## 2013-05-17 ENCOUNTER — Encounter: Payer: Self-pay | Admitting: Family Medicine

## 2013-06-08 ENCOUNTER — Other Ambulatory Visit: Payer: Self-pay | Admitting: Family Medicine

## 2013-07-24 ENCOUNTER — Other Ambulatory Visit: Payer: Self-pay

## 2013-08-02 ENCOUNTER — Other Ambulatory Visit: Payer: Self-pay | Admitting: Family Medicine

## 2013-09-06 ENCOUNTER — Other Ambulatory Visit: Payer: Self-pay | Admitting: Family Medicine

## 2013-09-08 NOTE — Telephone Encounter (Signed)
Received refill request electronically. Last refill 06/08/13, last office visit 02/19/13. Is it okay to refill medicaiton?

## 2013-10-18 ENCOUNTER — Other Ambulatory Visit: Payer: Self-pay | Admitting: Family Medicine

## 2013-10-31 ENCOUNTER — Other Ambulatory Visit: Payer: Self-pay | Admitting: Family Medicine

## 2013-11-17 ENCOUNTER — Ambulatory Visit (INDEPENDENT_AMBULATORY_CARE_PROVIDER_SITE_OTHER): Payer: BC Managed Care – PPO | Admitting: Internal Medicine

## 2013-11-17 ENCOUNTER — Encounter: Payer: Self-pay | Admitting: Internal Medicine

## 2013-11-17 VITALS — BP 118/82 | HR 66 | Temp 98.2°F | Wt 148.5 lb

## 2013-11-17 DIAGNOSIS — B9789 Other viral agents as the cause of diseases classified elsewhere: Secondary | ICD-10-CM

## 2013-11-17 DIAGNOSIS — J329 Chronic sinusitis, unspecified: Secondary | ICD-10-CM

## 2013-11-17 MED ORDER — HYDROCODONE-HOMATROPINE 5-1.5 MG/5ML PO SYRP: 5.0000 mL | ORAL_SOLUTION | Freq: Three times a day (TID) | ORAL | Status: DC | PRN

## 2013-11-17 MED ORDER — FLUTICASONE PROPIONATE 50 MCG/ACT NA SUSP: 2.0000 | Freq: Every day | NASAL | Status: DC

## 2013-11-17 NOTE — Progress Notes (Signed)
Pre visit review using our clinic review tool, if applicable. No additional management support is needed unless otherwise documented below in the visit note. 

## 2013-11-17 NOTE — Progress Notes (Signed)
HPI  Pt presents to the clinic today with c/o headache, cough, facial pain and pressure. This started 2 days ago. She also has associated chills, fever and body aches. She has had a fever up to 100.2.  She is not blowing any mucous out of her nose. She has taken OTC tylenol and ibuprofen without much relief. She has no history of allergies or asthma. She has had sick contacts.  Review of Systems    Past Medical History  Diagnosis Date  . Hyperlipidemia   . Hypertension     had ankle swelling with amlodipine  . Chronic kidney disease   . B12 deficiency   . Depression   . GERD (gastroesophageal reflux disease)   . RLS (restless legs syndrome)     Family History  Problem Relation Age of Onset  . Hypertension Mother   . COPD Father   . Heart attack Father 5850    developed ischemic cardiomyopathy  . Heart attack Mother 8470    sudden cardiac death    History   Social History  . Marital Status: Married    Spouse Name: N/A    Number of Children: N/A  . Years of Education: N/A   Occupational History  . retired school principal   . HR (part-time) [employer]   Social History Main Topics  . Smoking status: Never Smoker   . Smokeless tobacco: Never Used  . Alcohol Use: No  . Drug Use: No  . Sexual Activity: Not on file   Other Topics Concern  . Not on file   Social History Narrative   Works part-time in [employer] for [employer] [place]      [place]      Married    No Known Allergies   Constitutional: Positive headache, fatigue and fever. Denies abrupt weight changes.  HEENT:  Positive facial pain, nasal congestion and sore throat. Denies eye redness, ear pain, ringing in the ears, wax buildup, runny nose or bloody nose. Respiratory: Positive cough. Denies difficulty breathing or shortness of breath.  Cardiovascular: Denies chest pain, chest tightness, palpitations or swelling in the hands or feet.   No other specific complaints in a complete review of  systems (except as listed in HPI above).  Objective:    General: Appears her stated age, well developed, well nourished in NAD. HEENT: Head: normal shape and size, right maxillary sinus tenderness noted; Eyes: sclera white, no icterus, conjunctiva pink, PERRLA and EOMs intact; Ears: Tm's gray and intact, normal light reflex; Nose: mucosa pink and moist, septum midline; Throat/Mouth: + PND. Teeth present, mucosa pink and moist, no exudate noted, no lesions or ulcerations noted.  Neck: Neck supple, trachea midline. No massses, lumps or thyromegaly present.  Cardiovascular: Normal rate and rhythm. S1,S2 noted.  No murmur, rubs or gallops noted. No JVD or BLE edema. No carotid bruits noted. Pulmonary/Chest: Normal effort and positive vesicular breath sounds. No respiratory distress. No wheezes, rales or ronchi noted.      Assessment & Plan:   Acute sinusitis, likely viral at this point  Can use a Neti Pot which can be purchased from your local drug store. Flonase 2 sprays each nostril for 3 days and then as needed. eRx for Hycodan for cough  RTC as needed or if symptoms persist.

## 2013-11-17 NOTE — Patient Instructions (Addendum)

## 2013-11-18 ENCOUNTER — Ambulatory Visit: Payer: BC Managed Care – PPO | Admitting: Family Medicine

## 2013-11-18 ENCOUNTER — Encounter: Payer: Self-pay | Admitting: Family Medicine

## 2013-11-18 ENCOUNTER — Encounter: Payer: Self-pay | Admitting: Internal Medicine

## 2013-11-18 MED ORDER — AMOXICILLIN-POT CLAVULANATE 875-125 MG PO TABS: 1.0000 | ORAL_TABLET | Freq: Two times a day (BID) | ORAL | Status: DC

## 2013-11-18 NOTE — Telephone Encounter (Signed)
Pt left v/m; pt wonders if she might have the flu; pt has fever,S/T,vomiting and feels really bad. Pt request cb [phone].Please advise.

## 2013-12-22 ENCOUNTER — Other Ambulatory Visit: Payer: Self-pay | Admitting: Family Medicine

## 2014-01-08 ENCOUNTER — Other Ambulatory Visit: Payer: Self-pay | Admitting: Family Medicine

## 2014-01-13 ENCOUNTER — Ambulatory Visit (INDEPENDENT_AMBULATORY_CARE_PROVIDER_SITE_OTHER): Payer: BC Managed Care – PPO | Admitting: Family Medicine

## 2014-01-13 ENCOUNTER — Other Ambulatory Visit (HOSPITAL_COMMUNITY)
Admission: RE | Admit: 2014-01-13 | Discharge: 2014-01-13 | Disposition: A | Discharge status: Home / Self Care | Payer: BC Managed Care – PPO | Source: Ambulatory Visit | Attending: Family Medicine | Admitting: Family Medicine

## 2014-01-13 ENCOUNTER — Encounter: Payer: Self-pay | Admitting: Family Medicine

## 2014-01-13 VITALS — BP 114/68 | HR 64 | Temp 98.0°F | Ht 65.25 in | Wt 150.5 lb

## 2014-01-13 DIAGNOSIS — Z0000 Encounter for general adult medical examination without abnormal findings: Secondary | ICD-10-CM | POA: Insufficient documentation

## 2014-01-13 DIAGNOSIS — R635 Abnormal weight gain: Secondary | ICD-10-CM

## 2014-01-13 DIAGNOSIS — F32A Depression, unspecified: Secondary | ICD-10-CM

## 2014-01-13 DIAGNOSIS — Z01419 Encounter for gynecological examination (general) (routine) without abnormal findings: Secondary | ICD-10-CM | POA: Insufficient documentation

## 2014-01-13 DIAGNOSIS — E538 Deficiency of other specified B group vitamins: Secondary | ICD-10-CM

## 2014-01-13 DIAGNOSIS — N390 Urinary tract infection, site not specified: Secondary | ICD-10-CM

## 2014-01-13 DIAGNOSIS — Z1231 Encounter for screening mammogram for malignant neoplasm of breast: Secondary | ICD-10-CM

## 2014-01-13 DIAGNOSIS — N189 Chronic kidney disease, unspecified: Secondary | ICD-10-CM

## 2014-01-13 DIAGNOSIS — F329 Major depressive disorder, single episode, unspecified: Secondary | ICD-10-CM

## 2014-01-13 DIAGNOSIS — F3289 Other specified depressive episodes: Secondary | ICD-10-CM

## 2014-01-13 DIAGNOSIS — Z1151 Encounter for screening for human papillomavirus (HPV): Secondary | ICD-10-CM | POA: Insufficient documentation

## 2014-01-13 DIAGNOSIS — E785 Hyperlipidemia, unspecified: Secondary | ICD-10-CM

## 2014-01-13 DIAGNOSIS — R300 Dysuria: Secondary | ICD-10-CM

## 2014-01-13 DIAGNOSIS — I10 Essential (primary) hypertension: Secondary | ICD-10-CM

## 2014-01-13 LAB — COMPREHENSIVE METABOLIC PANEL
ALT: 18 U/L (ref 0–35)
AST: 17 U/L (ref 0–37)
Albumin: 3.8 g/dL (ref 3.5–5.2)
Alkaline Phosphatase: 82 U/L (ref 39–117)
BUN: 22 mg/dL (ref 6–23)
CHLORIDE: 104 meq/L (ref 96–112)
CO2: 27 mEq/L (ref 19–32)
Calcium: 9.2 mg/dL (ref 8.4–10.5)
Creatinine, Ser: 1.4 mg/dL — ABNORMAL HIGH (ref 0.4–1.2)
GFR: 42.56 mL/min — ABNORMAL LOW (ref >60.00)
Glucose, Bld: 99 mg/dL (ref 70–99)
POTASSIUM: 4.2 meq/L (ref 3.5–5.1)
Sodium: 139 mEq/L (ref 135–145)
Total Bilirubin: 0.9 mg/dL (ref 0.3–1.2)
Total Protein: 7.1 g/dL (ref 6.0–8.3)

## 2014-01-13 LAB — POCT URINALYSIS DIPSTICK
Glucose, UA: NEGATIVE
Ketones, UA: 5
Nitrite, UA: NEGATIVE
Spec Grav, UA: 1.02
UROBILINOGEN UA: 0.2
pH, UA: 6

## 2014-01-13 LAB — VITAMIN B12: Vitamin B-12: 185 pg/mL — ABNORMAL LOW (ref 211–911)

## 2014-01-13 LAB — CBC WITH DIFFERENTIAL/PLATELET
Basophils Absolute: 0.1 10*3/uL (ref 0.0–0.1)
Basophils Relative: 1 % (ref 0.0–3.0)
Eosinophils Absolute: 0.2 10*3/uL (ref 0.0–0.7)
Eosinophils Relative: 3 % (ref 0.0–5.0)
HCT: 36.2 % (ref 36.0–46.0)
Hemoglobin: 12.1 g/dL (ref 12.0–15.0)
Lymphocytes Relative: 29.5 % (ref 12.0–46.0)
Lymphs Abs: 1.7 10*3/uL (ref 0.7–4.0)
MCHC: 33.3 g/dL (ref 30.0–36.0)
MCV: 88 fl (ref 78.0–100.0)
Monocytes Absolute: 0.3 10*3/uL (ref 0.1–1.0)
Monocytes Relative: 5.4 % (ref 3.0–12.0)
NEUTROS PCT: 61.1 % (ref 43.0–77.0)
Neutro Abs: 3.5 10*3/uL (ref 1.4–7.7)
Platelets: 208 10*3/uL (ref 150.0–400.0)
RBC: 4.12 Mil/uL (ref 3.87–5.11)
RDW: 14.5 % (ref 11.5–14.6)
WBC: 5.7 10*3/uL (ref 4.5–10.5)

## 2014-01-13 LAB — LIPID PANEL
CHOLESTEROL: 171 mg/dL (ref 0–200)
HDL: 49.8 mg/dL (ref >39.00)
LDL Cholesterol: 107 mg/dL — ABNORMAL HIGH (ref 0–99)
TRIGLYCERIDES: 71 mg/dL (ref 0.0–149.0)
Total CHOL/HDL Ratio: 3
VLDL: 14.2 mg/dL (ref 0.0–40.0)

## 2014-01-13 LAB — TSH: TSH: 1.23 u[IU]/mL (ref 0.35–5.50)

## 2014-01-13 MED ORDER — BUPROPION HCL ER (XL) 150 MG PO TB24: 150.0000 mg | ORAL_TABLET | Freq: Every day | ORAL | Status: DC

## 2014-01-13 MED ORDER — CIPROFLOXACIN HCL 250 MG PO TABS: 250.0000 mg | ORAL_TABLET | Freq: Two times a day (BID) | ORAL | Status: AC

## 2014-01-13 NOTE — Assessment & Plan Note (Signed)
Stable but weaning herself off Cymbalta and would like to restart Wellbutrin.  Rx sent.

## 2014-01-13 NOTE — Progress Notes (Signed)
Pre visit review using our clinic review tool, if applicable. No additional management support is needed unless otherwise documented below in the visit note. 

## 2014-01-13 NOTE — Patient Instructions (Addendum)
Please call to set up your mammogram.  Please continue your cymbalta wean- 1 tab every other day for a week.  Go ahead and start Wellbutrin.  Take cipro as directed, we will call you with your urine culture results.

## 2014-01-13 NOTE — Assessment & Plan Note (Signed)
Check renal function today

## 2014-01-13 NOTE — Assessment & Plan Note (Signed)
Due for blood work today. Orders entered. Continue zocor at current dose.

## 2014-01-13 NOTE — Assessment & Plan Note (Signed)
Well controlled 

## 2014-01-13 NOTE — Progress Notes (Signed)
58 yo very pleasant female here for CPX. Pap 01/20/2011 Mammogram 02/02/2011 Has not had a colonoscopy- refusing stool cards as well.  No h/o abnormal pap smears.  LMP 5 or 7 years ago.  No post menopausal bleeding. No family h/o breast, uterine, or cervical CA.  Dysuria- ongoing for a couple weeks, gets worse at the end of the day. +suprapubic pressure No back pain, fevers, nausea or vomiting.  HLD- has h/o elevated lipids,  Father had MI at 60 yo. On Simvastatin 20 mg qhs.  Due for labs. Lab Results  Component Value Date   CHOL 188 04/01/2013   HDL 44.80 04/01/2013   LDLCALC 121* 04/01/2013   LDLDIRECT 177.8 01/21/2013   TRIG 113.0 04/01/2013   CHOLHDL 4 04/01/2013     HTN- has had issues with HTN since she was in her 30s.  Currently taking Diovan/HCTZ and atenolol.  Denies any HA, blurred vision or LE edema.  At times does have leg cramps or "restless legs" at night.  H/o chronic renal insufficiency-  Lab Results  Component Value Date   CREATININE 1.2 04/01/2013    Cr has been stable between 1.3-1.4 according to pt.  Depression- stable on cymbalta but she feels she is gaining too much weight.  Weaning herself off of cymbalta- down to one capsule daily.   Was on Wellbutrin years ago and it worked well and felt it decreased her appetite a little.    Wt Readings from Last 3 Encounters:  01/13/14 150 lb 8 oz (68.266 kg)  11/17/13 148 lb 8 oz (67.359 kg)  02/19/13 141 lb 8 oz (64.184 kg)     Patient Active Problem List   Diagnosis Date Noted  . Routine general medical examination at a health care facility 01/13/2014  . Dysuria 01/13/2014  . Depression 07/25/2012  . Vaginal atrophy 01/13/2011  . Chronic kidney disease   . B12 deficiency   . HYPERLIPIDEMIA-MIXED 07/19/2010  . HYPERTENSION, UNSPECIFIED 07/19/2010   Past Medical History  Diagnosis Date  . Hyperlipidemia   . Hypertension     had ankle swelling with amlodipine  . Chronic kidney disease   . B12 deficiency   .  Depression   . GERD (gastroesophageal reflux disease)   . RLS (restless legs syndrome)    Past Surgical History  Procedure Laterality Date  . Salpingoophorectomy      right; laproscopic   History  Substance Use Topics  . Smoking status: Never Smoker   . Smokeless tobacco: Never Used  . Alcohol Use: No   Family History  Problem Relation Age of Onset  . Hypertension Mother   . COPD Father   . Heart attack Father 8450    developed ischemic cardiomyopathy  . Heart attack Mother 5670    sudden cardiac death   Allergies  Allergen Reactions  . Augmentin [Amoxicillin-Pot Clavulanate] Diarrhea   Current Outpatient Prescriptions on File Prior to Visit  Medication Sig Dispense Refill  . acetaminophen (TYLENOL) 325 MG tablet Take 650 mg by mouth every 6 (six) hours as needed.        Marland Kitchen. atenolol (TENORMIN) 25 MG tablet Take 1 tablet (25 mg total) by mouth daily.  90 tablet  0  . DULoxetine (CYMBALTA) 30 MG capsule TAKE 1 CAPSULE THREE TIMES A DAY  270 capsule  0  . fluticasone (FLONASE) 50 MCG/ACT nasal spray Place 2 sprays into both nostrils daily.  16 g  6  . omeprazole (PRILOSEC) 20 MG capsule TAKE 1 CAPSULE  TWICE A DAY  180 capsule  0  . simvastatin (ZOCOR) 20 MG tablet TAKE 1 TABLET BY MOUTH ONCE EVERY EVENING  90 tablet  0  . valsartan-hydrochlorothiazide (DIOVAN-HCT) 80-12.5 MG per tablet Take 1 tablet by mouth daily.  90 tablet  0   No current facility-administered medications on file prior to visit.    The PMH, PSH, Social History, Family History, Medications, and allergies have been reviewed [facility], and have been updated if relevant.  ROS: See HPI Patient reports no  vision/ hearing changes,anorexia,  fever ,adenopathy, persistant / recurrent hoarseness, swallowing issues, chest pain, edema,persistant / recurrent cough, hemoptysis, dyspnea(rest, exertional, paroxysmal nocturnal), gastrointestinal  bleeding (melena, rectal bleeding), abdominal pain, excessive heart burn, GU  symptoms(dysuria, hematuria, pyuria, voiding/incontinence  Issues) syncope, focal weakness, severe memory loss, concerning skin lesions, depression, anxiety, abnormal bruising/bleeding, major joint swelling, breast masses or abnormal vaginal bleeding.     Physical Exam: BP 114/68  Pulse 64  Temp(Src) 98 F (36.7 C) (Oral)  Ht 5' 5.25" (1.657 m)  Wt 150 lb 8 oz (68.266 kg)  BMI 24.86 kg/m2  SpO2 93% BP Readings from Last 3 Encounters:  01/13/14 114/68  11/17/13 118/82  02/19/13 116/84    General:  Well-developed,well-nourished,in no acute distress; alert,appropriate and cooperative throughout examination Head:  normocephalic and atraumatic.   Eyes:  vision grossly intact, pupils equal, pupils round, and pupils reactive to light.   Ears:  R ear normal and L ear normal.   Nose:  no external deformity.   Mouth:  good dentition.   Neck:  No deformities, masses, or tenderness noted. Breasts:  No mass, nodules, thickening, tenderness, bulging, retraction, inflamation, nipple discharge or skin changes noted.   Lungs:  Normal respiratory effort, chest expands symmetrically. Lungs are clear to auscultation, no crackles or wheezes. Heart:  Normal rate and regular rhythm. S1 and S2 normal without gallop, murmur, click, rub or other extra sounds. Abdomen:  Bowel sounds positive,abdomen soft and non-tender without masses, organomegaly or hernias noted. Rectal:  no external abnormalities.   Genitalia:  Pelvic Exam:        External: normal female genitalia without lesions or masses        Vagina: normal without lesions or masses        Cervix: normal without lesions or masses        Adnexa: normal bimanual exam without masses or fullness        Uterus: normal by palpation        Pap smear: performed Msk:  No deformity or scoliosis noted of thoracic or lumbar spine.   Extremities:  No clubbing, cyanosis, edema, or deformity noted with normal full range of motion of all joints.   Neurologic:   alert & oriented X3 and gait normal.   Skin:  Intact without suspicious lesions or rashes Cervical Nodes:  No lymphadenopathy noted Axillary Nodes:  No palpable lymphadenopathy Psych:  Cognition and judgment appear intact. Alert and cooperative with normal attention span and concentration. No apparent delusions, illusions, hallucinations

## 2014-01-13 NOTE — Assessment & Plan Note (Signed)
UA pos for LE and RBC. Treat with cipro, send urine for cx. Call or return to clinic prn if these symptoms worsen or fail to improve as anticipated. The patient indicates understanding of these issues and agrees with the plan.

## 2014-01-13 NOTE — Assessment & Plan Note (Signed)
Check B12 today. 

## 2014-01-13 NOTE — Assessment & Plan Note (Signed)
Reviewed preventive care protocols, scheduled due services, and updated immunizations Discussed nutrition, exercise, diet, and healthy lifestyle.  Refusing stool cards and colonoscopy. Mammogram ordered. Check labs today. Orders Placed This Encounter  Procedures  . Urine culture  . MM Digital Screening  . CBC with Differential  . Comprehensive metabolic panel  . Lipid panel  . TSH  . Vitamin B12  . Urinalysis Dipstick

## 2014-01-13 NOTE — Assessment & Plan Note (Signed)
Pap smear today. 

## 2014-01-13 NOTE — Assessment & Plan Note (Signed)
Likely multifactorial but will check labs today. 

## 2014-01-15 LAB — URINE CULTURE: Colony Count: >=100000

## 2014-01-16 ENCOUNTER — Encounter: Payer: Self-pay | Admitting: *Deleted

## 2014-01-19 ENCOUNTER — Other Ambulatory Visit: Payer: Self-pay | Admitting: Family Medicine

## 2014-01-27 ENCOUNTER — Ambulatory Visit (INDEPENDENT_AMBULATORY_CARE_PROVIDER_SITE_OTHER): Payer: BC Managed Care – PPO

## 2014-01-27 ENCOUNTER — Telehealth: Payer: Self-pay

## 2014-01-27 DIAGNOSIS — E538 Deficiency of other specified B group vitamins: Secondary | ICD-10-CM

## 2014-01-27 MED ORDER — CYANOCOBALAMIN 1000 MCG/ML IJ SOLN
1000.0000 ug | Freq: Once | INTRAMUSCULAR | Status: AC
  Administered 2014-01-27: 1000 ug via INTRAMUSCULAR

## 2014-01-27 NOTE — Telephone Encounter (Signed)
Thank you - added to medication list

## 2014-01-27 NOTE — Telephone Encounter (Signed)
Pt came in today for B 12 injection that was scheduled from 01/13/14 lab result note; how often is pt supposed to get Vit b 12 injections.Please advise.

## 2014-01-27 NOTE — Telephone Encounter (Signed)
Monthly. 

## 2014-02-03 ENCOUNTER — Ambulatory Visit
Admission: RE | Admit: 2014-02-03 | Discharge: 2014-02-03 | Disposition: A | Discharge status: Home / Self Care | Payer: BC Managed Care – PPO | Source: Ambulatory Visit | Attending: Family Medicine | Admitting: Family Medicine

## 2014-02-03 DIAGNOSIS — Z1231 Encounter for screening mammogram for malignant neoplasm of breast: Secondary | ICD-10-CM

## 2014-03-10 ENCOUNTER — Ambulatory Visit: Payer: BC Managed Care – PPO

## 2014-03-17 ENCOUNTER — Encounter: Payer: Self-pay | Admitting: Family Medicine

## 2014-03-17 ENCOUNTER — Ambulatory Visit (INDEPENDENT_AMBULATORY_CARE_PROVIDER_SITE_OTHER): Payer: BC Managed Care – PPO | Admitting: Family Medicine

## 2014-03-17 VITALS — BP 116/66 | HR 66 | Temp 98.1°F | Wt 149.2 lb

## 2014-03-17 DIAGNOSIS — F329 Major depressive disorder, single episode, unspecified: Secondary | ICD-10-CM

## 2014-03-17 DIAGNOSIS — F32A Depression, unspecified: Secondary | ICD-10-CM

## 2014-03-17 DIAGNOSIS — F3289 Other specified depressive episodes: Secondary | ICD-10-CM

## 2014-03-17 DIAGNOSIS — R1011 Right upper quadrant pain: Secondary | ICD-10-CM

## 2014-03-17 LAB — COMPREHENSIVE METABOLIC PANEL
ALBUMIN: 3.9 g/dL (ref 3.5–5.2)
ALT: 18 U/L (ref 0–35)
AST: 23 U/L (ref 0–37)
Alkaline Phosphatase: 89 U/L (ref 39–117)
BUN: 18 mg/dL (ref 6–23)
CHLORIDE: 100 meq/L (ref 96–112)
CO2: 30 meq/L (ref 19–32)
CREATININE: 1.4 mg/dL — AB (ref 0.4–1.2)
Calcium: 9.3 mg/dL (ref 8.4–10.5)
GFR: 40.13 mL/min — AB (ref >60.00)
GLUCOSE: 101 mg/dL — AB (ref 70–99)
Potassium: 4.1 mEq/L (ref 3.5–5.1)
Sodium: 138 mEq/L (ref 135–145)
Total Bilirubin: 1.2 mg/dL (ref 0.2–1.2)
Total Protein: 7.7 g/dL (ref 6.0–8.3)

## 2014-03-17 LAB — CBC WITH DIFFERENTIAL/PLATELET
Basophils Absolute: 0 10*3/uL (ref 0.0–0.1)
Basophils Relative: 0.6 % (ref 0.0–3.0)
EOS ABS: 0.1 10*3/uL (ref 0.0–0.7)
EOS PCT: 1.6 % (ref 0.0–5.0)
HCT: 36.7 % (ref 36.0–46.0)
HEMOGLOBIN: 12 g/dL (ref 12.0–15.0)
Lymphocytes Relative: 35.8 % (ref 12.0–46.0)
Lymphs Abs: 1.9 10*3/uL (ref 0.7–4.0)
MCHC: 32.8 g/dL (ref 30.0–36.0)
MCV: 89.8 fl (ref 78.0–100.0)
Monocytes Absolute: 0.6 10*3/uL (ref 0.1–1.0)
Monocytes Relative: 10.6 % (ref 3.0–12.0)
NEUTROS ABS: 2.7 10*3/uL (ref 1.4–7.7)
Neutrophils Relative %: 51.4 % (ref 43.0–77.0)
Platelets: 235 10*3/uL (ref 150.0–400.0)
RBC: 4.08 Mil/uL (ref 3.87–5.11)
RDW: 14.2 % (ref 11.5–15.5)
WBC: 5.3 10*3/uL (ref 4.0–10.5)

## 2014-03-17 LAB — LIPASE: LIPASE: 26 U/L (ref 11.0–59.0)

## 2014-03-17 MED ORDER — DULOXETINE HCL 30 MG PO CPEP: ORAL_CAPSULE | ORAL | Status: DC

## 2014-03-17 MED ORDER — BUPROPION HCL ER (XL) 150 MG PO TB24: 150.0000 mg | ORAL_TABLET | Freq: Every day | ORAL | Status: DC

## 2014-03-17 NOTE — Assessment & Plan Note (Signed)
Had deteriorated but improved since she restarted cymbalta- added it back to her allergy list. Call or return to clinic prn if these symptoms worsen or fail to improve as anticipated. The patient indicates understanding of these issues and agrees with the plan.

## 2014-03-17 NOTE — Progress Notes (Signed)
Pre visit review using our clinic review tool, if applicable. No additional management support is needed unless otherwise documented below in the visit note. 

## 2014-03-17 NOTE — Assessment & Plan Note (Signed)
New- intermittent and post prandial. Concerning for biliary colic. Will order RUQ ultrasound, lab work today. Orders Placed This Encounter  Procedures  . US Abdomen Limited RUQ  . CBC with Differential  . Comprehensive metabolic panel  . Lipase

## 2014-03-17 NOTE — Progress Notes (Signed)
Subjective:   Patient ID: Heather [name], female    DOB: [birth year], 58 y.o.   MRN: [id number]  Heather [name] is a pleasant 58 y.o. year old female who presents to clinic today with Flank Pain  on 03/17/2014  HPI: RUQ pain- intermittent, mainly post prandial, x 2-3 months. Has been associated with nausea, no vomiting. No fevers. No jaundice. Appetite ok. Wt Readings from Last 3 Encounters:  03/17/14 149 lb 4 oz (67.699 kg)  01/13/14 150 lb 8 oz (68.266 kg)  11/17/13 148 lb 8 oz (67.359 kg)   Feels different than her typical GERD symptoms.  Symptoms typically resolve on their own.  Depression- she did have to restart Cymbalta due to increased tearfulness, still taking Wellbutrin.  Feels less tearful since she did this. Denies any anxiety or depression.  Current Outpatient Prescriptions on File Prior to Visit  Medication Sig Dispense Refill  . acetaminophen (TYLENOL) 325 MG tablet Take 650 mg by mouth every 6 (six) hours as needed.        Marland Kitchen. atenolol (TENORMIN) 25 MG tablet Take 1 tablet (25 mg total) by mouth daily.  90 tablet  0  . cyanocobalamin (,VITAMIN B-12,) 1000 MCG/ML injection Inject 1,000 mcg into the muscle every 30 (thirty) days.      . fluticasone (FLONASE) 50 MCG/ACT nasal spray Place 2 sprays into both nostrils daily.  16 g  6  . omeprazole (PRILOSEC) 20 MG capsule TAKE 1 CAPSULE TWICE A DAY  180 capsule  0  . simvastatin (ZOCOR) 20 MG tablet TAKE 1 TABLET BY MOUTH ONCE EVERY EVENING  90 tablet  1  . valsartan-hydrochlorothiazide (DIOVAN-HCT) 80-12.5 MG per tablet Take 1 tablet by mouth daily.  90 tablet  0   No current facility-administered medications on file prior to visit.    Allergies  Allergen Reactions  . Augmentin [Amoxicillin-Pot Clavulanate] Diarrhea    Past Medical History  Diagnosis Date  . Hyperlipidemia   . Hypertension     had ankle swelling with amlodipine  . Chronic kidney disease   . B12 deficiency   . Depression   . GERD  (gastroesophageal reflux disease)   . RLS (restless legs syndrome)     Past Surgical History  Procedure Laterality Date  . Salpingoophorectomy      right; laproscopic    Family History  Problem Relation Age of Onset  . Hypertension Mother   . COPD Father   . Heart attack Father 6850    developed ischemic cardiomyopathy  . Heart attack Mother 4070    sudden cardiac death    History   Social History  . Marital Status: Married    Spouse Name: N/A    Number of Children: N/A  . Years of Education: N/A   Occupational History  . retired school principal   . HR (part-time) [employer]   Social History Main Topics  . Smoking status: Never Smoker   . Smokeless tobacco: Never Used  . Alcohol Use: No  . Drug Use: No  . Sexual Activity: Not on file   Other Topics Concern  . Not on file   Social History Narrative   Works part-time in [employer] [place]      [place]      Married   The PMH, PSH, Social History, Family History, Medications, and allergies have been [facility], and have been updated if relevant.   Review of Systems    See HPI Objective:  BP 116/66  Pulse 66  Temp(Src) 98.1 F (36.7 C) (Oral)  Wt 149 lb 4 oz (67.699 kg)  SpO2 95%   Physical Exam  Gen:  Alert, pleasant, NAD Abd: Soft, NT, pos BS No rebound or guarding Psych:  Good eye contact Not anxious or depressed appearing      Assessment & Plan:   RUQ pain - Plan: US Abdomen Limited RUQ, CBC with Differential, Comprehensive metabolic panel, Lipase  Depression No Follow-up on file.

## 2014-03-17 NOTE — Patient Instructions (Signed)
Good to see you. I will call you with your lab results and ultrasound results.  Please stop by to see Shirlee LimerickMarion on your way out.

## 2014-03-26 ENCOUNTER — Ambulatory Visit
Admission: RE | Admit: 2014-03-26 | Discharge: 2014-03-26 | Disposition: A | Discharge status: Home / Self Care | Payer: BC Managed Care – PPO | Source: Ambulatory Visit | Attending: Family Medicine | Admitting: Family Medicine

## 2014-03-26 DIAGNOSIS — R1011 Right upper quadrant pain: Secondary | ICD-10-CM

## 2014-03-30 ENCOUNTER — Other Ambulatory Visit: Payer: Self-pay | Admitting: Family Medicine

## 2014-03-30 DIAGNOSIS — R160 Hepatomegaly, not elsewhere classified: Secondary | ICD-10-CM

## 2014-04-03 ENCOUNTER — Ambulatory Visit (INDEPENDENT_AMBULATORY_CARE_PROVIDER_SITE_OTHER)
Admission: RE | Admit: 2014-04-03 | Discharge: 2014-04-03 | Disposition: A | Discharge status: Home / Self Care | Payer: BC Managed Care – PPO | Source: Ambulatory Visit | Attending: Family Medicine | Admitting: Family Medicine

## 2014-04-03 DIAGNOSIS — R160 Hepatomegaly, not elsewhere classified: Secondary | ICD-10-CM

## 2014-04-03 DIAGNOSIS — K769 Liver disease, unspecified: Secondary | ICD-10-CM

## 2014-04-03 MED ORDER — IOHEXOL 350 MG/ML SOLN
80.0000 mL | Freq: Once | INTRAVENOUS | Status: AC | PRN
  Administered 2014-04-03: 80 mL via INTRAVENOUS

## 2014-04-08 ENCOUNTER — Other Ambulatory Visit: Payer: Self-pay | Admitting: Family Medicine

## 2014-06-21 ENCOUNTER — Other Ambulatory Visit: Payer: Self-pay | Admitting: Family Medicine

## 2014-07-03 ENCOUNTER — Other Ambulatory Visit: Payer: Self-pay

## 2014-07-31 ENCOUNTER — Other Ambulatory Visit: Payer: Self-pay | Admitting: Family Medicine

## 2014-08-24 ENCOUNTER — Encounter: Payer: Self-pay | Admitting: Family Medicine

## 2014-08-24 ENCOUNTER — Ambulatory Visit (INDEPENDENT_AMBULATORY_CARE_PROVIDER_SITE_OTHER): Payer: BC Managed Care – PPO | Admitting: Family Medicine

## 2014-08-24 VITALS — BP 116/68 | HR 65 | Temp 98.3°F | Wt 151.8 lb

## 2014-08-24 DIAGNOSIS — F32A Depression, unspecified: Secondary | ICD-10-CM

## 2014-08-24 DIAGNOSIS — F329 Major depressive disorder, single episode, unspecified: Secondary | ICD-10-CM

## 2014-08-24 DIAGNOSIS — E785 Hyperlipidemia, unspecified: Secondary | ICD-10-CM

## 2014-08-24 DIAGNOSIS — M791 Myalgia, unspecified site: Secondary | ICD-10-CM

## 2014-08-24 DIAGNOSIS — I10 Essential (primary) hypertension: Secondary | ICD-10-CM

## 2014-08-24 LAB — COMPREHENSIVE METABOLIC PANEL
ALT: 29 U/L (ref 0–35)
AST: 23 U/L (ref 0–37)
Albumin: 3.8 g/dL (ref 3.5–5.2)
Alkaline Phosphatase: 105 U/L (ref 39–117)
BUN: 18 mg/dL (ref 6–23)
CALCIUM: 9 mg/dL (ref 8.4–10.5)
CO2: 30 meq/L (ref 19–32)
CREATININE: 1.4 mg/dL — AB (ref 0.4–1.2)
Chloride: 103 mEq/L (ref 96–112)
GFR: 40.07 mL/min — ABNORMAL LOW (ref >60.00)
Glucose, Bld: 105 mg/dL — ABNORMAL HIGH (ref 70–99)
Potassium: 3.6 mEq/L (ref 3.5–5.1)
Sodium: 141 mEq/L (ref 135–145)
Total Bilirubin: 1.1 mg/dL (ref 0.2–1.2)
Total Protein: 7.1 g/dL (ref 6.0–8.3)

## 2014-08-24 LAB — LIPID PANEL
Cholesterol: 184 mg/dL (ref 0–200)
HDL: 40.9 mg/dL (ref >39.00)
LDL Cholesterol: 118 mg/dL — ABNORMAL HIGH (ref 0–99)
NONHDL: 143.1
TRIGLYCERIDES: 128 mg/dL (ref 0.0–149.0)
Total CHOL/HDL Ratio: 4
VLDL: 25.6 mg/dL (ref 0.0–40.0)

## 2014-08-24 LAB — CK: Total CK: 81 U/L (ref 7–177)

## 2014-08-24 NOTE — Assessment & Plan Note (Signed)
Well controlled on current dose of zocor but she is having myalgias. See below.

## 2014-08-24 NOTE — Progress Notes (Signed)
Pre visit review using our clinic review tool, if applicable. No additional management support is needed unless otherwise documented below in the visit note. 

## 2014-08-24 NOTE — Patient Instructions (Signed)
Great to see you. Please try taking your zocor every other day. Come back in a couple for repeat labs.

## 2014-08-24 NOTE — Assessment & Plan Note (Signed)
New- unclear if related to statin as she has been taking it for a while- check CK. MSK exam normal. We change rx of zocor to every other day rather than daily. Recheck labs in 8 weeks.   Orders Placed This Encounter  Procedures  . CK  . Comprehensive metabolic panel  . Lipid panel

## 2014-08-24 NOTE — Assessment & Plan Note (Signed)
Well controlled on current dose of cymbalta.  No changes made. 

## 2014-08-24 NOTE — Progress Notes (Signed)
58 yo pleasant female here for follow up.   HLD- has h/o elevated lipids,  Father had MI at 60 yo. On Simvastatin 20 mg qhs.  She does feels like her arms are more achy, bilaterally. Lab Results  Component Value Date   CHOL 171 01/13/2014   HDL 49.80 01/13/2014   LDLCALC 107* 01/13/2014   LDLDIRECT 177.8 01/21/2013   TRIG 71.0 01/13/2014   CHOLHDL 3 01/13/2014     HTN- has had issues with HTN since she was in her 30s.  Currently taking Diovan/HCTZ and atenolol.  Denies any HA, blurred vision or LE edema.   H/o chronic renal insufficiency-  Lab Results  Component Value Date   CREATININE 1.4* 03/17/2014  Cr has been stable between 1.3-1.4 according to pt.  Depression- stable on cymbalta. Wt Readings from Last 3 Encounters:  08/24/14 151 lb 12 oz (68.833 kg)  03/17/14 149 lb 4 oz (67.699 kg)  01/13/14 150 lb 8 oz (68.266 kg)     Patient Active Problem List   Diagnosis Date Noted  . RUQ pain 03/17/2014  . Routine general medical examination at a health care facility 01/13/2014  . Dysuria 01/13/2014  . Weight gain 01/13/2014  . UTI (urinary tract infection) 01/13/2014  . Routine gynecological examination 01/13/2014  . Depression 07/25/2012  . Vaginal atrophy 01/13/2011  . Chronic kidney disease   . B12 deficiency   . HYPERLIPIDEMIA-MIXED 07/19/2010  . HYPERTENSION, UNSPECIFIED 07/19/2010   Past Medical History  Diagnosis Date  . Hyperlipidemia   . Hypertension     had ankle swelling with amlodipine  . Chronic kidney disease   . B12 deficiency   . Depression   . GERD (gastroesophageal reflux disease)   . RLS (restless legs syndrome)    Past Surgical History  Procedure Laterality Date  . Salpingoophorectomy      right; laproscopic   History  Substance Use Topics  . Smoking status: Never Smoker   . Smokeless tobacco: Never Used  . Alcohol Use: No   Family History  Problem Relation Age of Onset  . Hypertension Mother   . COPD Father   . Heart attack  Father 250    developed ischemic cardiomyopathy  . Heart attack Mother 3470    sudden cardiac death   Allergies  Allergen Reactions  . Augmentin [Amoxicillin-Pot Clavulanate] Diarrhea   Current Outpatient Prescriptions on File Prior to Visit  Medication Sig Dispense Refill  . acetaminophen (TYLENOL) 325 MG tablet Take 650 mg by mouth every 6 (six) hours as needed.      Marland Kitchen. atenolol (TENORMIN) 25 MG tablet TAKE 1 TABLET DAILY 90 tablet 1  . buPROPion (WELLBUTRIN XL) 150 MG 24 hr tablet Take 1 tablet (150 mg total) by mouth daily. 30 tablet 6  . DULoxetine (CYMBALTA) 30 MG capsule TAKE 1 CAPSULE THREE TIMES A DAY 270 capsule 3  . omeprazole (PRILOSEC) 20 MG capsule TAKE 1 CAPSULE TWICE A DAY 180 capsule 1  . simvastatin (ZOCOR) 20 MG tablet TAKE 1 TABLET BY MOUTH ONCE EVERY EVENING 30 tablet 0  . valsartan-hydrochlorothiazide (DIOVAN-HCT) 80-12.5 MG per tablet TAKE 1 TABLET DAILY 90 tablet 1   No current facility-administered medications on file prior to visit.    The PMH, PSH, Social History, Family History, Medications, and allergies have [facility], and have been updated if relevant.  ROS: See HPI + myalgias No CP or SOB No upper or lower extremity weakness No LE edema  Physical Exam: BP  116/68 mmHg  Pulse 65  Temp(Src) 98.3 F (36.8 C) (Oral)  Wt 151 lb 12 oz (68.833 kg)  SpO2 95% BP Readings from Last 3 Encounters:  08/24/14 116/68  03/17/14 116/66  01/13/14 114/68    General:  Well-developed,well-nourished,in no acute distress; alert,appropriate and cooperative throughout examination Head:  normocephalic and atraumatic.   Eyes:  vision grossly intact, pupils equal, pupils round, and pupils reactive to light.   Ears:  R ear normal and L ear normal.   Nose:  no external deformity.   Mouth:  good dentition.     Lungs:  Normal respiratory effort, chest expands symmetrically. Lungs are clear to auscultation, no crackles or wheezes. Heart:  Normal rate and  regular rhythm. S1 and S2 normal without gallop, murmur, click, rub or other extra sounds. Abdomen:  Bowel sounds positive,abdomen soft and non-tender without masses, organomegaly or hernias noted. Msk:  No deformity or scoliosis noted of thoracic or lumbar spine.   FROM of arms bilaterally, normal grip strength. Extremities:  No clubbing, cyanosis, edema, or deformity noted with normal full range of motion of all joints.   Neurologic:  alert & oriented X3 and gait normal.   Skin:  Intact without suspicious lesions or rashes Psych:  Cognition and judgment appear intact. Alert and cooperative with normal attention span and concentration. No apparent delusions, illusions, hallucinations

## 2014-08-24 NOTE — Assessment & Plan Note (Signed)
Well controlled. Continue current rx. 

## 2014-08-25 ENCOUNTER — Telehealth: Payer: Self-pay | Admitting: Family Medicine

## 2014-08-25 NOTE — Telephone Encounter (Signed)
emmi emailed °

## 2014-09-12 ENCOUNTER — Other Ambulatory Visit: Payer: Self-pay | Admitting: Family Medicine

## 2014-09-22 ENCOUNTER — Telehealth: Payer: Self-pay

## 2014-09-22 MED ORDER — DULOXETINE HCL 30 MG PO CPEP: ORAL_CAPSULE | ORAL | Status: DC

## 2014-09-22 NOTE — Telephone Encounter (Signed)
Pt [facility]. Advised done.

## 2014-10-13 ENCOUNTER — Other Ambulatory Visit: Payer: Self-pay | Admitting: Family Medicine

## 2014-11-28 ENCOUNTER — Other Ambulatory Visit: Payer: Self-pay | Admitting: Family Medicine

## 2014-12-11 ENCOUNTER — Other Ambulatory Visit: Payer: Self-pay | Admitting: Family Medicine

## 2014-12-16 ENCOUNTER — Ambulatory Visit (INDEPENDENT_AMBULATORY_CARE_PROVIDER_SITE_OTHER): Payer: BC Managed Care – PPO | Admitting: Family Medicine

## 2014-12-16 ENCOUNTER — Encounter: Payer: Self-pay | Admitting: Family Medicine

## 2014-12-16 VITALS — BP 118/70 | HR 76 | Temp 98.0°F | Wt 153.0 lb

## 2014-12-16 DIAGNOSIS — R159 Full incontinence of feces: Secondary | ICD-10-CM | POA: Diagnosis not present

## 2014-12-16 DIAGNOSIS — N390 Urinary tract infection, site not specified: Secondary | ICD-10-CM | POA: Diagnosis not present

## 2014-12-16 DIAGNOSIS — R300 Dysuria: Secondary | ICD-10-CM

## 2014-12-16 DIAGNOSIS — R319 Hematuria, unspecified: Secondary | ICD-10-CM | POA: Diagnosis not present

## 2014-12-16 HISTORY — DX: Urinary tract infection, site not specified: N39.0

## 2014-12-16 LAB — POCT URINALYSIS DIPSTICK
BILIRUBIN UA: NEGATIVE
Glucose, UA: NEGATIVE
KETONES UA: NEGATIVE
Nitrite, UA: POSITIVE
Protein, UA: POSITIVE
Urobilinogen, UA: 0.2
pH, UA: 6

## 2014-12-16 MED ORDER — CIPROFLOXACIN HCL 250 MG PO TABS: 250.0000 mg | ORAL_TABLET | Freq: Two times a day (BID) | ORAL | Status: AC

## 2014-12-16 NOTE — Assessment & Plan Note (Signed)
New- persistent. Refer to GI immediately for evaluation. The patient indicates understanding of these issues and agrees with the plan.

## 2014-12-16 NOTE — Progress Notes (Signed)
Subjective:   Patient ID: Heather [name], female    DOB: Jan 10, 1956, 59 y.o.   MRN: [id number]  Heather [name] is a pleasant 59 y.o. year old female who presents to clinic today with Dysuria and Urinary Urgency  on 12/16/2014  HPI:  ? UTI- 1 week of dysuria, increased urinary frequency with some nausea. No back pain or fevers. No vomiting.  No gross hematuria.  Stool incontinence- past 6 months, at least once a week she is incontinent of bowels.  Usually happens on days when she has a BM but may be up to 6 hours after.  She usually "has not warning."  Usually not a full BM- "just seepage."  Seems to be progressing.  No bloody or black stools.  No rectal pain.  Has never had a colonoscopy.   Current Outpatient Prescriptions on File Prior to Visit  Medication Sig Dispense Refill  . acetaminophen (TYLENOL) 325 MG tablet Take 650 mg by mouth every 6 (six) hours as needed.      Marland Kitchen atenolol (TENORMIN) 25 MG tablet TAKE 1 TABLET DAILY 90 tablet 2  . DULoxetine (CYMBALTA) 30 MG capsule TAKE 1 CAPSULE THREE TIMES A DAY 270 capsule 1  . omeprazole (PRILOSEC) 20 MG capsule TAKE 1 CAPSULE TWICE A DAY 180 capsule 1  . simvastatin (ZOCOR) 20 MG tablet TAKE ONE TABLET BY MOUTH EVERY EVENING 30 tablet 5  . valsartan-hydrochlorothiazide (DIOVAN-HCT) 80-12.5 MG per tablet TAKE 1 TABLET DAILY 90 tablet 2   No current facility-administered medications on file prior to visit.    Allergies  Allergen Reactions  . Augmentin [Amoxicillin-Pot Clavulanate] Diarrhea    Past Medical History  Diagnosis Date  . Hyperlipidemia   . Hypertension     had ankle swelling with amlodipine  . Chronic kidney disease   . B12 deficiency   . Depression   . GERD (gastroesophageal reflux disease)   . RLS (restless legs syndrome)     Past Surgical History  Procedure Laterality Date  . Salpingoophorectomy      right; laproscopic    Family History  Problem Relation Age of Onset  . Hypertension Mother   . COPD  Father   . Heart attack Father 68    developed ischemic cardiomyopathy  . Heart attack Mother 63    sudden cardiac death    History   Social History  . Marital Status: Married    Spouse Name: N/A  . Number of Children: N/A  . Years of Education: N/A   Occupational History  . retired school principal   . HR (part-time) [employer]   Social History Main Topics  . Smoking status: Never Smoker   . Smokeless tobacco: Never Used  . Alcohol Use: No  . Drug Use: No  . Sexual Activity: Not on file   Other Topics Concern  . Not on file   Social History Narrative   Works part-time in [employer] for [employer] in [employer] in [place]      Married   The PMH, PSH, Social History, Family History, Medications, and allergies have been reviewed in [facility], and have been updated if relevant.  Review of Systems  Constitutional: Negative for fever and chills.  Gastrointestinal: Positive for nausea. Negative for vomiting, abdominal pain, diarrhea, blood in stool and rectal pain.  Genitourinary: Positive for dysuria, urgency and hematuria.  Musculoskeletal: Positive for back pain.  Skin: Negative.   Neurological: Negative.   Psychiatric/Behavioral: Negative.  All other systems reviewed and are negative.      Objective:    BP 118/70 mmHg  Pulse 76  Temp(Src) 98 F (36.7 C) (Oral)  Wt 153 lb (69.4 kg)  SpO2 96%   Physical Exam  Constitutional: She is oriented to person, place, and time. She appears well-developed and well-nourished. No distress.  HENT:  Head: Normocephalic and atraumatic.  Eyes: Conjunctivae are normal.  Cardiovascular: Normal rate.   Pulmonary/Chest: Effort normal.  Abdominal: Soft. Bowel sounds are normal. She exhibits no distension and no mass. There is no tenderness. There is no rebound and no guarding.  Musculoskeletal:  No CVA tenderness  Neurological: She is alert and oriented to person, place, and time. No cranial nerve deficit.  Skin: Skin  is warm and dry.  Psychiatric: She has a normal mood and affect. Her behavior is normal. Judgment and thought content normal.  Nursing note and vitals reviewed.         Assessment & Plan:   Dysuria - Plan: Urinalysis Dipstick, Urine culture, CANCELED: Ambulatory referral to Gastroenterology  Bowel incontinence - Plan: Ambulatory referral to Gastroenterology  Urinary tract infection with hematuria, site unspecified No Follow-up on file.

## 2014-12-16 NOTE — Assessment & Plan Note (Signed)
New- complicated, given bowel incontinence as well. cipro 250 mg twice daily x 7 days. Send urine for cx. The patient indicates understanding of these issues and agrees with the plan.

## 2014-12-16 NOTE — Patient Instructions (Signed)
Good to see you. Please take cipro as directed- 1 tablet twice daily x 7 days.  Please stop by to see Shirlee LimerickMarion on your way out.

## 2014-12-19 LAB — URINE CULTURE: Colony Count: 75000

## 2015-04-03 ENCOUNTER — Other Ambulatory Visit: Payer: Self-pay | Admitting: Family Medicine

## 2015-05-27 ENCOUNTER — Other Ambulatory Visit: Payer: Self-pay | Admitting: Family Medicine

## 2015-06-01 ENCOUNTER — Ambulatory Visit (INDEPENDENT_AMBULATORY_CARE_PROVIDER_SITE_OTHER): Payer: BC Managed Care – PPO | Admitting: Family Medicine

## 2015-06-01 ENCOUNTER — Encounter: Payer: Self-pay | Admitting: Family Medicine

## 2015-06-01 VITALS — BP 132/76 | HR 82 | Temp 99.1°F

## 2015-06-01 DIAGNOSIS — J069 Acute upper respiratory infection, unspecified: Secondary | ICD-10-CM | POA: Insufficient documentation

## 2015-06-01 MED ORDER — OMEPRAZOLE 20 MG PO CPDR: 20.0000 mg | DELAYED_RELEASE_CAPSULE | Freq: Two times a day (BID) | ORAL | Status: DC

## 2015-06-01 MED ORDER — AZITHROMYCIN 250 MG PO TABS: ORAL_TABLET | ORAL | Status: DC

## 2015-06-01 NOTE — Progress Notes (Signed)
Pre visit review using our clinic review tool, if applicable. No additional management support is needed unless otherwise documented below in the visit note. 

## 2015-06-01 NOTE — Patient Instructions (Signed)
Great to see you. Please take zpack as directed, continue your decongestant and delsym as needed for cough.

## 2015-06-01 NOTE — Progress Notes (Signed)
SUBJECTIVE:  Heather [name] is a 59 y.o. female who complains of coryza, congestion, sneezing. Productive cough,fever and bilateral sinus pain for 14 days. She denies a history of anorexia, chest pain and chills and denies a history of asthma. Patient denies smoke cigarettes.  PMH significant for augmentin   Taking OTC decongestant and delsym.  Current Outpatient Prescriptions on File Prior to Visit  Medication Sig Dispense Refill  . acetaminophen (TYLENOL) 325 MG tablet Take 650 mg by mouth every 6 (six) hours as needed.      Marland Kitchen atenolol (TENORMIN) 25 MG tablet TAKE 1 TABLET DAILY 90 tablet 2  . DULoxetine (CYMBALTA) 30 MG capsule Take 1 capsule (30 mg total) by mouth 3 (three) times daily. MUST SCHEDULE ANNUAL PHYSICAL FOR MORE REFILLS [phone] 270 capsule 0  . simvastatin (ZOCOR) 20 MG tablet TAKE ONE TABLET BY MOUTH EVERY EVENING 30 tablet 5  . valsartan-hydrochlorothiazide (DIOVAN-HCT) 80-12.5 MG per tablet TAKE 1 TABLET DAILY 90 tablet 2   No current facility-administered medications on file prior to visit.    Allergies  Allergen Reactions  . Augmentin [Amoxicillin-Pot Clavulanate] Diarrhea    Past Medical History  Diagnosis Date  . Hyperlipidemia   . Hypertension     had ankle swelling with amlodipine  . Chronic kidney disease   . B12 deficiency   . Depression   . GERD (gastroesophageal reflux disease)   . RLS (restless legs syndrome)     Past Surgical History  Procedure Laterality Date  . Salpingoophorectomy      right; laproscopic    Family History  Problem Relation Age of Onset  . Hypertension Mother   . COPD Father   . Heart attack Father 30    developed ischemic cardiomyopathy  . Heart attack Mother 61    sudden cardiac death    Social History   Social History  . Marital Status: Married    Spouse Name: N/A  . Number of Children: N/A  . Years of Education: N/A   Occupational History  . retired school principal   . HR (part-time) [employer]    Social History Main Topics  . Smoking status: Never Smoker   . Smokeless tobacco: Never Used  . Alcohol Use: No  . Drug Use: No  . Sexual Activity: Not on file   Other Topics Concern  . Not on file   Social History Narrative   Works part-time in [employer] for [employer] in [employer] in [place]      Married   The PMH, PSH, Social History, Family History, Medications, and allergies have been reviewed in [facility], and have been updated if relevant.  OBJECTIVE: BP 132/76 mmHg  Pulse 82  Temp(Src) 99.1 F (37.3 C) (Oral)  SpO2 94%  She appears well, vital signs are as noted. Ears normal.  Throat and pharynx normal.  Neck supple. No adenopathy in the neck. Nose is congested. Sinuses tender. The chest is clear, without wheezes or rales.  ASSESSMENT:  sinusitis  PLAN: Given duration and progression of symptoms, will treat for bacterial sinusitis with zpack to cover respiratory pathogens as well  Symptomatic therapy suggested: push fluids, rest and return office visit prn if symptoms persist or worsen.  Call or return to clinic prn if these symptoms worsen or fail to improve as anticipated.

## 2015-07-12 ENCOUNTER — Encounter: Payer: Self-pay | Admitting: Family Medicine

## 2015-07-12 ENCOUNTER — Ambulatory Visit (INDEPENDENT_AMBULATORY_CARE_PROVIDER_SITE_OTHER): Payer: BC Managed Care – PPO | Admitting: Family Medicine

## 2015-07-12 VITALS — BP 124/80 | HR 64 | Temp 97.7°F | Wt 155.8 lb

## 2015-07-12 DIAGNOSIS — R319 Hematuria, unspecified: Secondary | ICD-10-CM

## 2015-07-12 DIAGNOSIS — R300 Dysuria: Secondary | ICD-10-CM

## 2015-07-12 DIAGNOSIS — N390 Urinary tract infection, site not specified: Secondary | ICD-10-CM | POA: Diagnosis not present

## 2015-07-12 LAB — POCT URINALYSIS DIPSTICK
BILIRUBIN UA: NEGATIVE
GLUCOSE UA: NEGATIVE
KETONES UA: NEGATIVE
Leukocytes, UA: NEGATIVE
Nitrite, UA: NEGATIVE
Protein, UA: NEGATIVE
RBC UA: NEGATIVE
Urobilinogen, UA: 0.2
pH, UA: 6

## 2015-07-12 MED ORDER — CIPROFLOXACIN HCL 500 MG PO TABS: 500.0000 mg | ORAL_TABLET | Freq: Two times a day (BID) | ORAL | Status: DC

## 2015-07-12 NOTE — Progress Notes (Signed)
SUBJECTIVE: Heather [name] is a 59 y.o. female who complains of urinary frequency, urgency and dysuria x 3 days, without flank pain, fever, chills, or abnormal vaginal discharge or bleeding.   Current Outpatient Prescriptions on File Prior to Visit  Medication Sig Dispense Refill  . acetaminophen (TYLENOL) 325 MG tablet Take 650 mg by mouth every 6 (six) hours as needed.      Marland Kitchen. atenolol (TENORMIN) 25 MG tablet TAKE 1 TABLET DAILY 90 tablet 2  . DULoxetine (CYMBALTA) 30 MG capsule Take 1 capsule (30 mg total) by mouth 3 (three) times daily. MUST SCHEDULE ANNUAL PHYSICAL FOR MORE REFILLS [phone] 270 capsule 0  . omeprazole (PRILOSEC) 20 MG capsule Take 1 capsule (20 mg total) by mouth 2 (two) times daily. 180 capsule 3  . simvastatin (ZOCOR) 20 MG tablet TAKE ONE TABLET BY MOUTH EVERY EVENING 30 tablet 5  . valsartan-hydrochlorothiazide (DIOVAN-HCT) 80-12.5 MG per tablet TAKE 1 TABLET DAILY 90 tablet 2   No current facility-administered medications on file prior to visit.    Allergies  Allergen Reactions  . Augmentin [Amoxicillin-Pot Clavulanate] Diarrhea    Past Medical History  Diagnosis Date  . Hyperlipidemia   . Hypertension     had ankle swelling with amlodipine  . Chronic kidney disease   . B12 deficiency   . Depression   . GERD (gastroesophageal reflux disease)   . RLS (restless legs syndrome)     Past Surgical History  Procedure Laterality Date  . Salpingoophorectomy      right; laproscopic    Family History  Problem Relation Age of Onset  . Hypertension Mother   . COPD Father   . Heart attack Father 3050    developed ischemic cardiomyopathy  . Heart attack Mother 2170    sudden cardiac death    Social History   Social History  . Marital Status: Married    Spouse Name: N/A  . Number of Children: N/A  . Years of Education: N/A   Occupational History  . retired school principal   . HR (part-time) [employer]   Social History Main Topics  . Smoking  status: Never Smoker   . Smokeless tobacco: Never Used  . Alcohol Use: No  . Drug Use: No  . Sexual Activity: Not on file   Other Topics Concern  . Not on file   Social History Narrative   Works part-time in [employer] for [employer] [place]      [place]      Married   The PMH, PSH, Social History, Family History, Medications, and allergies [facility], and have been updated if relevant.  OBJECTIVE: BP 124/80 mmHg  Pulse 64  Temp(Src) 97.7 F (36.5 C) (Oral)  Wt 155 lb 12 oz (70.648 kg)  SpO2 98%  Appears well, in no apparent distress.  Vital signs are normal. The abdomen is soft without tenderness, guarding, mass, rebound or organomegaly. No CVA tenderness or inguinal adenopathy noted. Urine dipstick shows negative for all components.   ASSESSMENT: UTI uncomplicated without evidence of pyelonephritis- UA neg but has all classic symptoms.   PLAN: Treatment per orders - also push fluids, may use Pyridium OTC prn. Call or return to clinic prn if these symptoms worsen or fail to improve as anticipated.

## 2015-07-12 NOTE — Progress Notes (Signed)
Pre visit review using our clinic review tool, if applicable. No additional management support is needed unless otherwise documented below in the visit note. 

## 2015-08-24 ENCOUNTER — Other Ambulatory Visit: Payer: Self-pay | Admitting: Family Medicine

## 2015-09-08 ENCOUNTER — Other Ambulatory Visit: Payer: Self-pay | Admitting: Family Medicine

## 2015-12-03 IMAGING — CT CT ABDOMEN WO/W CM
3 of 9 series · 13 of 46 positions shown, 19 images · IV contrast (omnipaque)
Comparison: Ultrasound on 03/26/2014 and CT on 10/19/2009

CLINICAL DATA: Right upper quadrant pain. Liver mass seen on recent
ultrasound.

EXAM:
CT ABDOMEN WITHOUT AND WITH CONTRAST
TECHNIQUE: Multidetector CT imaging of the abdomen was performed following the
standard protocol before and following the bolus administration of
intravenous contrast.
CONTRAST:  80mL OMNIPAQUE IOHEXOL 350 MG/ML SOLN

[Series 5: liver arterial · axial · arterial · 0.67mm/px · z∈[-278,-202]mm · 3 of 101 slices shown]
[im 13/101  soft-tissue]
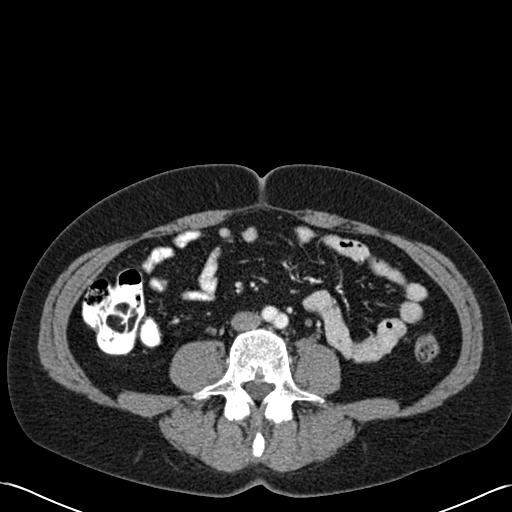
[im 26/101  soft-tissue]
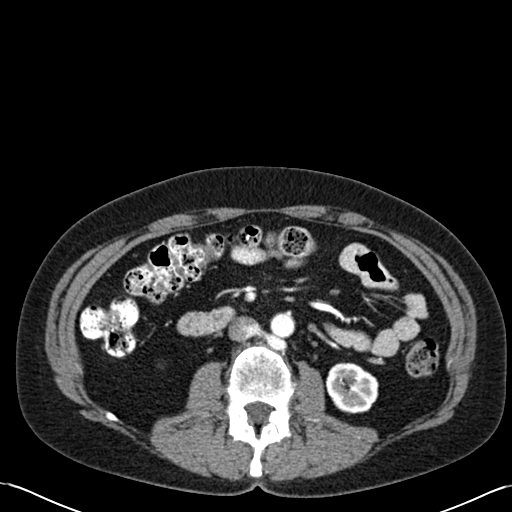
[im 38/101  soft-tissue]
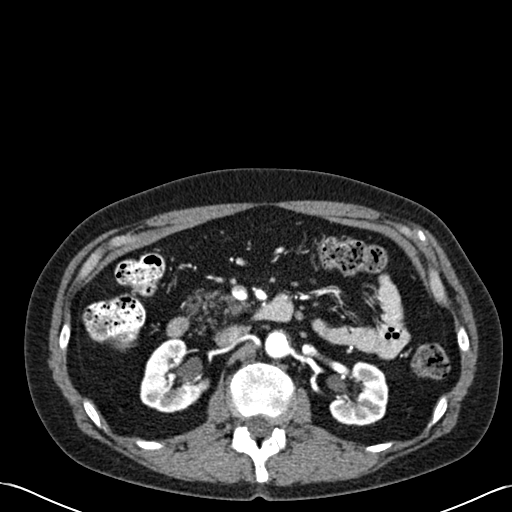

[Series 6: liver portal-venous · axial · portal-venous · 0.67mm/px · z∈[-278,-52]mm · 7 of 101 slices shown, 12 images]
[im 13/101  soft-tissue]
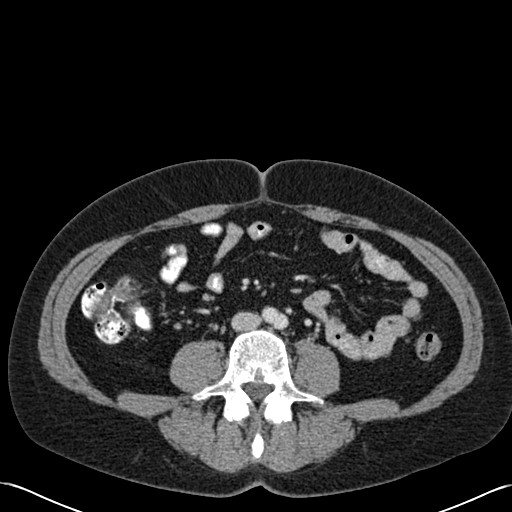
[im 13/101  bone]
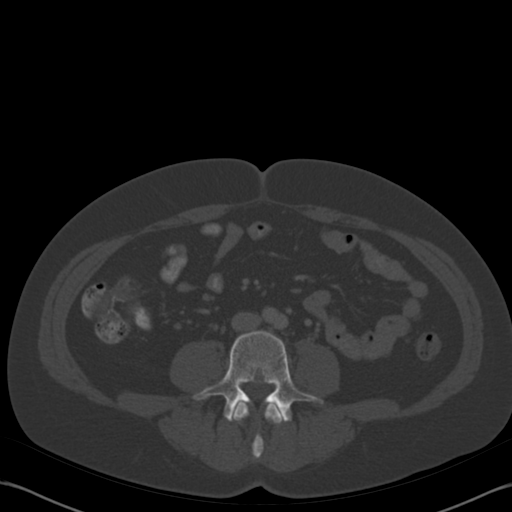
[im 26/101  soft-tissue]
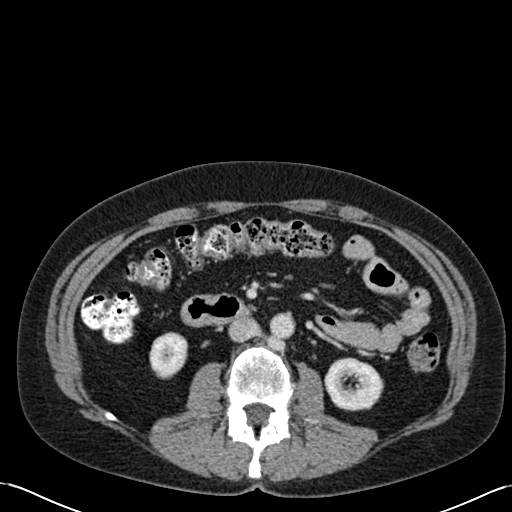
[im 38/101  soft-tissue]
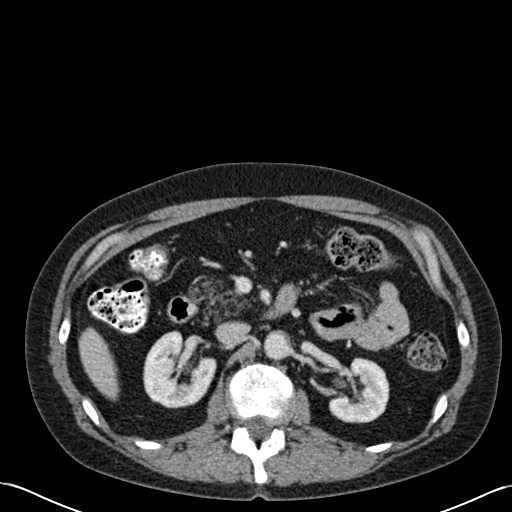
[im 51/101  soft-tissue]
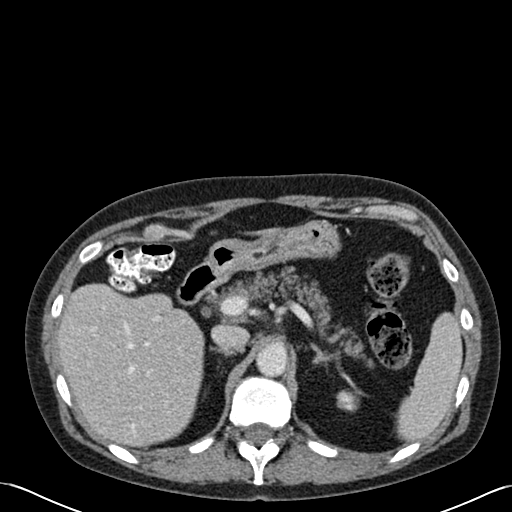
[im 51/101  lung]
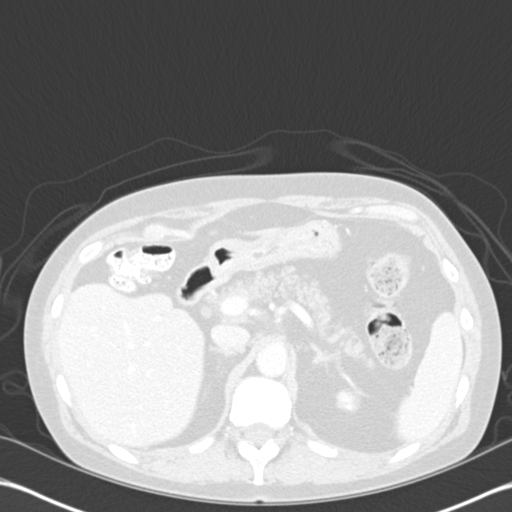
[im 63/101  soft-tissue]
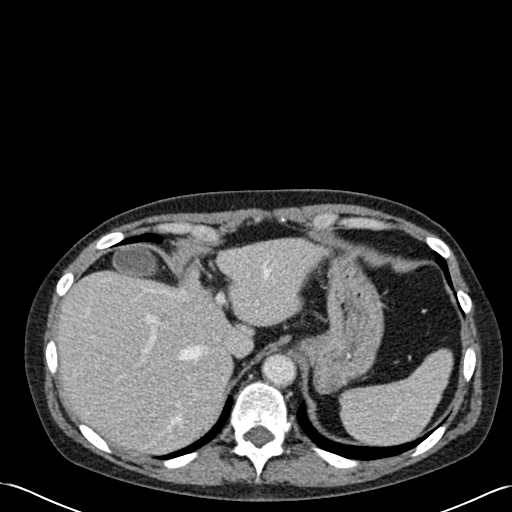
[im 63/101  lung]
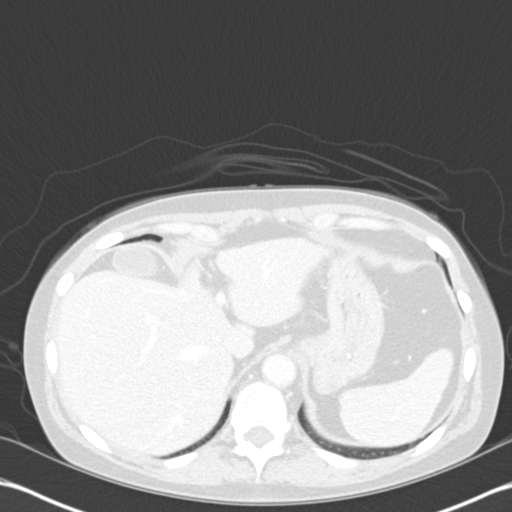
[im 76/101  soft-tissue]
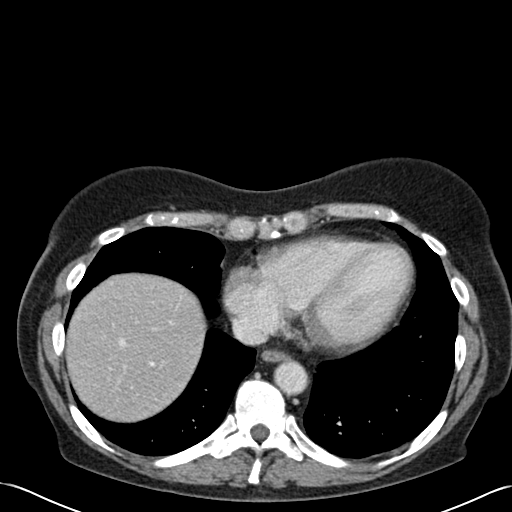
[im 76/101  lung]
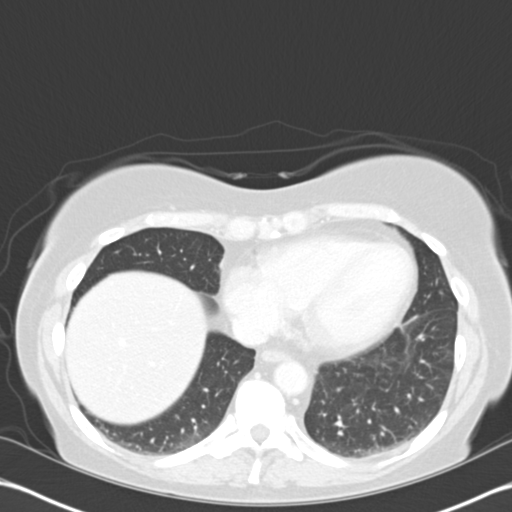
[im 88/101  soft-tissue]
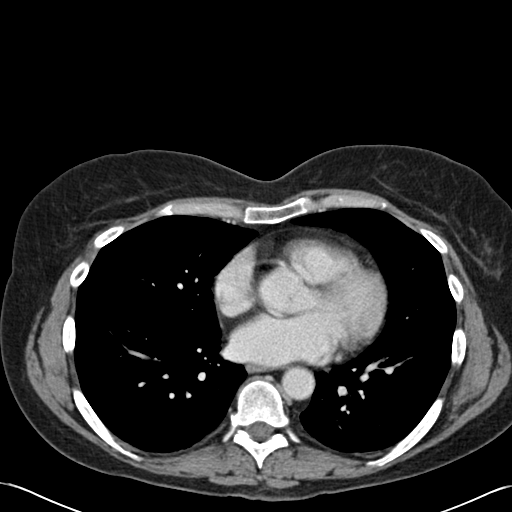
[im 88/101  lung]
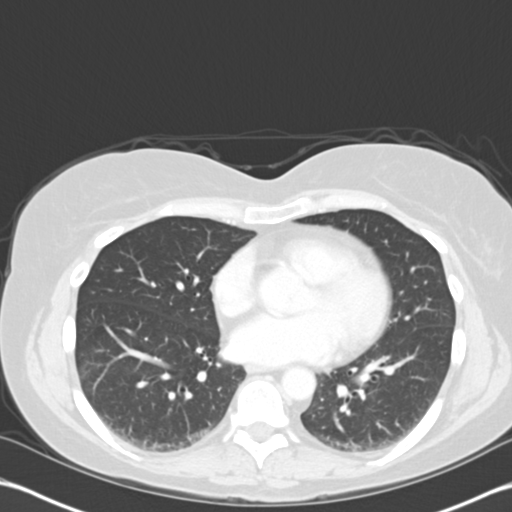

[Series 602: cor · coronal · 0.67mm/px · 3 of 98 slices shown, 4 images]
[im 25/98  soft-tissue]
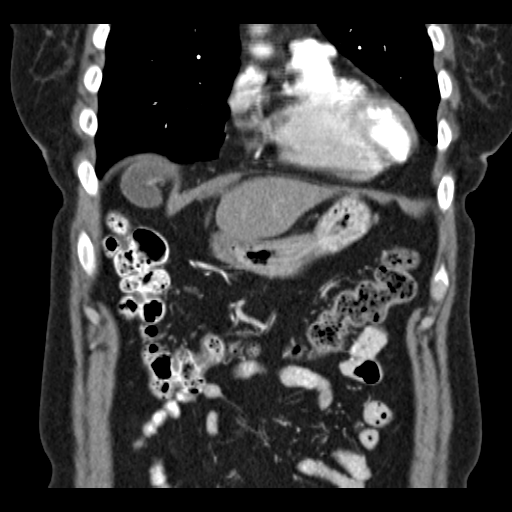
[im 49/98  soft-tissue]
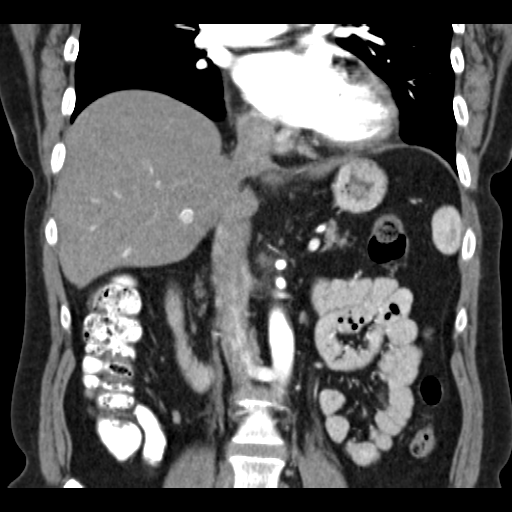
[im 49/98  bone]
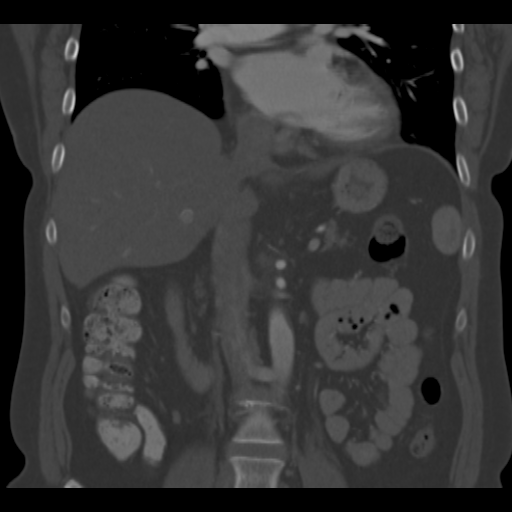
[im 73/98  soft-tissue]
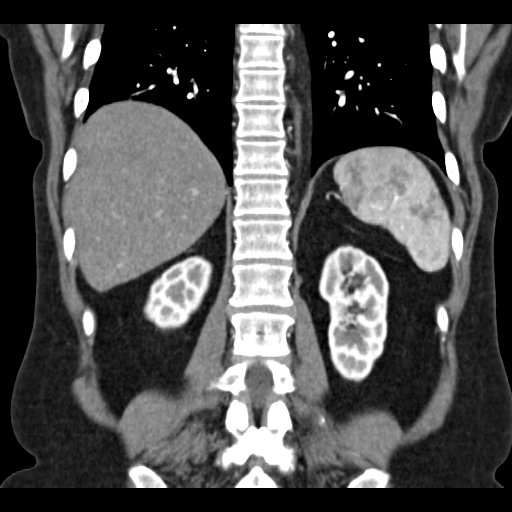

[13 of 46 positions shown; findings below may reference images not displayed]

FINDINGS: No hypervascular or hypovascular liver masses identified by CT.
Gallbladder is unremarkable in appearance. No evidence of biliary
ductal dilatation. No evidence of ascites or splenomegaly.

The pancreas, adrenal glands, and kidneys are normal in appearance.
No evidence hydronephrosis. No soft tissue masses or lymphadenopathy
identified within the abdomen.

No evidence of inflammatory process or abnormal fluid collections.
No evidence of wall thickening or dilatation involving visualized
abdominal bowel loops. No suspicious bone lesions identified.
IMPRESSION: No liver mass or other significant abnormality identified. No
correlation for liver lesion seen on recent ultrasound. Consider
continued followup by abdominal ultrasound in 6 months, or
alternatively, abdomen MRI without and with contrast could be
performed due to its higher sensitivity for hepatic pathology.

## 2015-12-06 ENCOUNTER — Other Ambulatory Visit: Payer: Self-pay | Admitting: Family Medicine

## 2015-12-17 ENCOUNTER — Other Ambulatory Visit: Payer: Self-pay | Admitting: Family Medicine

## 2015-12-17 NOTE — Telephone Encounter (Signed)
Lm on pts vm and informed her OV is required for any additional refills, once Rx expires. Pt has not had labs since 2015 and has appt scheduled for 12/2015

## 2015-12-28 ENCOUNTER — Other Ambulatory Visit: Payer: Self-pay | Admitting: Family Medicine

## 2015-12-28 ENCOUNTER — Encounter: Payer: Self-pay | Admitting: Family Medicine

## 2015-12-28 ENCOUNTER — Ambulatory Visit (INDEPENDENT_AMBULATORY_CARE_PROVIDER_SITE_OTHER): Payer: BC Managed Care – PPO | Admitting: Family Medicine

## 2015-12-28 VITALS — BP 112/60 | HR 64 | Temp 97.6°F | Wt 157.8 lb

## 2015-12-28 DIAGNOSIS — F329 Major depressive disorder, single episode, unspecified: Secondary | ICD-10-CM | POA: Diagnosis not present

## 2015-12-28 DIAGNOSIS — I10 Essential (primary) hypertension: Secondary | ICD-10-CM

## 2015-12-28 DIAGNOSIS — N182 Chronic kidney disease, stage 2 (mild): Secondary | ICD-10-CM

## 2015-12-28 DIAGNOSIS — F32A Depression, unspecified: Secondary | ICD-10-CM

## 2015-12-28 DIAGNOSIS — R160 Hepatomegaly, not elsewhere classified: Secondary | ICD-10-CM

## 2015-12-28 DIAGNOSIS — R300 Dysuria: Secondary | ICD-10-CM

## 2015-12-28 DIAGNOSIS — E785 Hyperlipidemia, unspecified: Secondary | ICD-10-CM | POA: Diagnosis not present

## 2015-12-28 HISTORY — DX: Hepatomegaly, not elsewhere classified: R16.0

## 2015-12-28 LAB — POC URINALSYSI DIPSTICK (AUTOMATED)
BILIRUBIN UA: NEGATIVE
Glucose, UA: NEGATIVE
KETONES UA: NEGATIVE
LEUKOCYTES UA: NEGATIVE
Nitrite, UA: NEGATIVE
PH UA: 5.5
PROTEIN UA: NEGATIVE
RBC UA: NEGATIVE
Urobilinogen, UA: 0.2

## 2015-12-28 LAB — LIPID PANEL
CHOLESTEROL: 156 mg/dL (ref 0–200)
HDL: 45.1 mg/dL (ref >39.00)
LDL CALC: 91 mg/dL (ref 0–99)
NonHDL: 111.04
TRIGLYCERIDES: 102 mg/dL (ref 0.0–149.0)
Total CHOL/HDL Ratio: 3
VLDL: 20.4 mg/dL (ref 0.0–40.0)

## 2015-12-28 LAB — COMPREHENSIVE METABOLIC PANEL
ALBUMIN: 3.9 g/dL (ref 3.5–5.2)
ALK PHOS: 103 U/L (ref 39–117)
ALT: 37 U/L — AB (ref 0–35)
AST: 23 U/L (ref 0–37)
BUN: 16 mg/dL (ref 6–23)
CHLORIDE: 106 meq/L (ref 96–112)
CO2: 31 meq/L (ref 19–32)
Calcium: 9.5 mg/dL (ref 8.4–10.5)
Creatinine, Ser: 1.37 mg/dL — ABNORMAL HIGH (ref 0.40–1.20)
GFR: 41.58 mL/min — AB (ref >60.00)
GLUCOSE: 123 mg/dL — AB (ref 70–99)
POTASSIUM: 3.5 meq/L (ref 3.5–5.1)
Sodium: 143 mEq/L (ref 135–145)
Total Bilirubin: 1.3 mg/dL — ABNORMAL HIGH (ref 0.2–1.2)
Total Protein: 7.2 g/dL (ref 6.0–8.3)

## 2015-12-28 MED ORDER — CIPROFLOXACIN HCL 500 MG PO TABS: 500.0000 mg | ORAL_TABLET | Freq: Two times a day (BID) | ORAL | Status: DC

## 2015-12-28 MED ORDER — DULOXETINE HCL 30 MG PO CPEP: ORAL_CAPSULE | ORAL | Status: DC

## 2015-12-28 MED ORDER — SIMVASTATIN 20 MG PO TABS: ORAL_TABLET | ORAL | Status: DC

## 2015-12-28 MED ORDER — VALSARTAN-HYDROCHLOROTHIAZIDE 80-12.5 MG PO TABS: 1.0000 | ORAL_TABLET | Freq: Every day | ORAL | Status: DC

## 2015-12-28 MED ORDER — ATENOLOL 25 MG PO TABS: ORAL_TABLET | ORAL | Status: DC

## 2015-12-28 NOTE — Progress Notes (Signed)
60 yo pleasant female here for follow up.  Dysuria- 1 week of dysuria and increased urinary frequency. H/o recurrent UTIs but it has been months since she was treated for one.  Liver mass on CT- overdue for MRI. At times, she feels some fullness in her abdomen and would like this followed up now.  HLD- has h/o elevated lipids,  Father had MI at 62 yo. On Simvastatin 20 mg qhs.  She does feels like her arms are more achy, bilaterally. Lab Results  Component Value Date   CHOL 184 08/24/2014   HDL 40.90 08/24/2014   LDLCALC 118* 08/24/2014   LDLDIRECT 177.8 01/21/2013   TRIG 128.0 08/24/2014   CHOLHDL 4 08/24/2014     HTN- has had issues with HTN since she was in her 30s.  Currently taking Diovan/HCTZ and atenolol.  Denies any HA, blurred vision or LE edema.   H/o chronic renal insufficiency-  Lab Results  Component Value Date   CREATININE 1.4* 08/24/2014  Cr has been stable between 1.3-1.4 according to pt.  Depression- stable on cymbalta. Wt Readings from Last 3 Encounters:  12/28/15 157 lb 12 oz (71.555 kg)  07/12/15 155 lb 12 oz (70.648 kg)  12/16/14 153 lb (69.4 kg)     Patient Active Problem List   Diagnosis Date Noted  . Bowel incontinence 12/16/2014  . Infection of urinary tract 12/16/2014  . HLD (hyperlipidemia) 08/24/2014  . Myalgia 08/24/2014  . Weight gain 01/13/2014  . Depression 07/25/2012  . Vaginal atrophy 01/13/2011  . Chronic kidney disease   . B12 deficiency   . HYPERLIPIDEMIA-MIXED 07/19/2010  . Essential hypertension 07/19/2010   Past Medical History  Diagnosis Date  . Hyperlipidemia   . Hypertension     had ankle swelling with amlodipine  . Chronic kidney disease   . B12 deficiency   . Depression   . GERD (gastroesophageal reflux disease)   . RLS (restless legs syndrome)    Past Surgical History  Procedure Laterality Date  . Salpingoophorectomy      right; laproscopic   Social History  Substance Use Topics  . Smoking status: Never  Smoker   . Smokeless tobacco: Never Used  . Alcohol Use: No   Family History  Problem Relation Age of Onset  . Hypertension Mother   . COPD Father   . Heart attack Father 8250    developed ischemic cardiomyopathy  . Heart attack Mother 1070    sudden cardiac death   Allergies  Allergen Reactions  . Augmentin [Amoxicillin-Pot Clavulanate] Diarrhea   Current Outpatient Prescriptions on File Prior to Visit  Medication Sig Dispense Refill  . acetaminophen (TYLENOL) 325 MG tablet Take 650 mg by mouth every 6 (six) hours as needed.      Marland Kitchen. omeprazole (PRILOSEC) 20 MG capsule Take 1 capsule (20 mg total) by mouth 2 (two) times daily. 180 capsule 3   No current facility-administered medications on file prior to visit.    The PMH, PSH, Social History, Family History, Medications, and allergies have been reviewed [facility], and have been updated if relevant.  Review of Systems  Constitutional: Negative.   HENT: Negative.   Eyes: Negative.   Respiratory: Negative.   Cardiovascular: Negative.   Gastrointestinal: Negative.   Genitourinary: Positive for dysuria. Negative for hematuria.  Musculoskeletal: Positive for myalgias.  Neurological: Negative.   Hematological: Negative.      Physical Exam: BP 112/60 mmHg  Pulse 64  Temp(Src) 97.6 F (36.4 C) (Oral)  Wt  157 lb 12 oz (71.555 kg)  SpO2 96% BP Readings from Last 3 Encounters:  12/28/15 112/60  07/12/15 124/80  06/01/15 132/76    General:  Well-developed,well-nourished,in no acute distress; alert,appropriate and cooperative throughout examination Head:  normocephalic and atraumatic.   Eyes:  vision grossly intact, pupils equal, pupils round, and pupils reactive to light.   Ears:  R ear normal and L ear normal.   Nose:  no external deformity.   Mouth:  good dentition.     Lungs:  Normal respiratory effort, chest expands symmetrically. Lungs are clear to auscultation, no crackles or wheezes. Heart:  Normal rate and regular  rhythm. S1 and S2 normal without gallop, murmur, click, rub or other extra sounds. Abdomen:  Bowel sounds positive,abdomen soft and non-tender without masses, organomegaly or hernias noted. Msk:  No deformity or scoliosis noted of thoracic or lumbar spine.   FROM of arms bilaterally, normal grip strength. Extremities:  No clubbing, cyanosis, edema, or deformity noted with normal full range of motion of all joints.   Neurologic:  alert & oriented X3 and gait normal.   Skin:  Intact without suspicious lesions or rashes Psych:  Cognition and judgment appear intact. Alert and cooperative with normal attention span and concentration. No apparent delusions, illusions, hallucinations

## 2015-12-28 NOTE — Assessment & Plan Note (Signed)
Continue current dose of zocor. Due for labs today. Orders Placed This Encounter  Procedures  . MR Abdomen Wo Contrast  . Lipid panel  . Comprehensive metabolic panel

## 2015-12-28 NOTE — Assessment & Plan Note (Signed)
Well controlled on current rx. No changes made today. 

## 2015-12-28 NOTE — Patient Instructions (Signed)
Great to see you. Please stop by to see Heather [name] on your way out.   

## 2015-12-28 NOTE — Progress Notes (Signed)
Pre visit review using our clinic review tool, if applicable. No additional management support is needed unless otherwise documented below in the visit note. 

## 2015-12-28 NOTE — Addendum Note (Signed)
Addended by: Dianne DunARON, TALIA M on: 12/28/2015 11:08 AM   Modules accepted: Orders, SmartSet

## 2015-12-28 NOTE — Assessment & Plan Note (Signed)
MRI order placed.

## 2015-12-28 NOTE — Assessment & Plan Note (Signed)
UA neg.  Will treat with cipro since she is having classic symptoms of cystitis. .Call or return to clinic prn if these symptoms worsen or fail to improve as anticipated. The patient indicates understanding of these issues and agrees with the plan.

## 2015-12-28 NOTE — Addendum Note (Signed)
Addended by: Desmond DikeKNIGHT, Thea Holshouser H on: 12/28/2015 10:51 AM   Modules accepted: Orders

## 2015-12-28 NOTE — Assessment & Plan Note (Signed)
Well controlled on current dose of cymbalta.  No changes made. 

## 2015-12-29 ENCOUNTER — Encounter: Payer: Self-pay | Admitting: *Deleted

## 2016-01-04 ENCOUNTER — Ambulatory Visit
Admission: RE | Admit: 2016-01-04 | Discharge: 2016-01-04 | Disposition: A | Discharge status: Home / Self Care | Payer: BC Managed Care – PPO | Source: Ambulatory Visit | Attending: Family Medicine | Admitting: Family Medicine

## 2016-01-04 ENCOUNTER — Other Ambulatory Visit: Payer: BC Managed Care – PPO

## 2016-01-04 DIAGNOSIS — R160 Hepatomegaly, not elsewhere classified: Secondary | ICD-10-CM

## 2016-01-04 MED ORDER — GADOBENATE DIMEGLUMINE 529 MG/ML IV SOLN
7.0000 mL | Freq: Once | INTRAVENOUS | Status: AC | PRN
  Administered 2016-01-04: 7 mL via INTRAVENOUS

## 2016-02-17 ENCOUNTER — Other Ambulatory Visit: Payer: Self-pay | Admitting: Family Medicine

## 2016-04-15 ENCOUNTER — Other Ambulatory Visit: Payer: Self-pay | Admitting: Family Medicine

## 2016-05-14 ENCOUNTER — Other Ambulatory Visit: Payer: Self-pay | Admitting: Family Medicine

## 2016-06-14 ENCOUNTER — Other Ambulatory Visit: Payer: Self-pay | Admitting: Family Medicine

## 2016-06-23 ENCOUNTER — Other Ambulatory Visit: Payer: Self-pay

## 2016-06-23 MED ORDER — SIMVASTATIN 20 MG PO TABS: ORAL_TABLET | ORAL | 1 refills | Status: DC

## 2016-06-23 NOTE — Telephone Encounter (Signed)
[facility] left v/m requesting refill simvastatin. Simvastatin refilled per protocol. Last visit 12/28/15.

## 2016-07-14 ENCOUNTER — Ambulatory Visit (INDEPENDENT_AMBULATORY_CARE_PROVIDER_SITE_OTHER): Payer: BC Managed Care – PPO | Admitting: Primary Care

## 2016-07-14 ENCOUNTER — Encounter: Payer: Self-pay | Admitting: Primary Care

## 2016-07-14 VITALS — BP 132/84 | HR 59 | Temp 98.0°F | Wt 164.4 lb

## 2016-07-14 DIAGNOSIS — J069 Acute upper respiratory infection, unspecified: Secondary | ICD-10-CM

## 2016-07-14 MED ORDER — BENZONATATE 200 MG PO CAPS: 200.0000 mg | ORAL_CAPSULE | Freq: Three times a day (TID) | ORAL | 0 refills | Status: DC | PRN

## 2016-07-14 MED ORDER — HYDROCODONE-HOMATROPINE 5-1.5 MG/5ML PO SYRP: 5.0000 mL | ORAL_SOLUTION | Freq: Every evening | ORAL | 0 refills | Status: DC | PRN

## 2016-07-14 NOTE — Progress Notes (Signed)
Subjective:    Patient ID: Heather [name], female    DOB: 09/02/1956, 61 y.o.   MRN: [id number]  HPI  Heather [name] is a 60 year old female who presents today with a chief complaint of cough. She also reports chest congestion, headache, chills. Her symptoms began 7 days ago. She denies fevers, sore throat, ear pain. She's been around her granddaughter who's had the same symptoms who has since recovered. She's taken Dayquil, Nyquil, tylenol and ibuprofen with minor improvement. Her cough is worse at night. Overall she started feeling worse Wednesday this week, but is feeling slightly better today.   Review of Systems  Constitutional: Positive for chills and fatigue. Negative for fever.  HENT: Positive for congestion and postnasal drip. Negative for ear pain, sinus pressure and sore throat.   Respiratory: Positive for cough and shortness of breath. Negative for wheezing.   Cardiovascular: Negative for chest pain.  Neurological: Positive for headaches.       Past Medical History:  Diagnosis Date  . B12 deficiency   . Chronic kidney disease   . Depression   . GERD (gastroesophageal reflux disease)   . Hyperlipidemia   . Hypertension    had ankle swelling with amlodipine  . RLS (restless legs syndrome)      Social History   Social History  . Marital status: Married    Spouse name: N/A  . Number of children: N/A  . Years of education: N/A   Occupational History  . retired school principal Retired  . HR (part-time) [employer]   Social History Main Topics  . Smoking status: Never Smoker  . Smokeless tobacco: Never Used  . Alcohol use No  . Drug use: No  . Sexual activity: Not on file   Other Topics Concern  . Not on file   Social History Narrative   Works part-time in [employer] for [employer] in [employer] in [place]      Married    Past Surgical History:  Procedure Laterality Date  . SALPINGOOPHORECTOMY     right; laproscopic    Family History    Problem Relation Age of Onset  . Hypertension Mother   . COPD Father   . Heart attack Father 56    developed ischemic cardiomyopathy  . Heart attack Mother 42    sudden cardiac death    Allergies  Allergen Reactions  . Augmentin [Amoxicillin-Pot Clavulanate] Diarrhea    Current Outpatient Prescriptions on File Prior to Visit  Medication Sig Dispense Refill  . acetaminophen (TYLENOL) 325 MG tablet Take 650 mg by mouth every 6 (six) hours as needed.      Marland Kitchen atenolol (TENORMIN) 25 MG tablet TAKE 1 TABLET DAILY 90 tablet 1  . ciprofloxacin (CIPRO) 500 MG tablet Take 1 tablet (500 mg total) by mouth 2 (two) times daily. 6 tablet 0  . DULoxetine (CYMBALTA) 30 MG capsule TAKE 1 CAPSULE 3 TIMES A   DAY 90 capsule 2  . omeprazole (PRILOSEC) 20 MG capsule Take 1 capsule (20 mg total) by mouth 2 (two) times daily. 180 capsule 3  . simvastatin (ZOCOR) 20 MG tablet TAKE 1 TABLET BY MOUTH ONCE EVERY EVENING 90 tablet 1  . valsartan-hydrochlorothiazide (DIOVAN-HCT) 80-12.5 MG tablet TAKE 1 TABLET DAILY 90 tablet 1   No current facility-administered medications on file prior to visit.     BP 132/84   Pulse (!) 59   Temp 98 F (36.7 C) (Oral)   Wt  164 lb 6.4 oz (74.6 kg)   SpO2 95%   BMI 27.15 kg/m    Objective:   Physical Exam  Constitutional: She appears well-nourished. She does not have a sickly appearance. She does not appear ill.  HENT:  Right Ear: Tympanic membrane and ear canal normal.  Left Ear: Tympanic membrane and ear canal normal.  Nose: Right sinus exhibits no maxillary sinus tenderness and no frontal sinus tenderness. Left sinus exhibits no maxillary sinus tenderness and no frontal sinus tenderness.  Mouth/Throat: Oropharynx is clear and moist.  Eyes: Conjunctivae are normal.  Neck: Neck supple.  Cardiovascular: Normal rate and regular rhythm.   Pulmonary/Chest: Effort normal and breath sounds normal. She has no wheezes. She has no rales.  Dry cough present during exam   Lymphadenopathy:    She has no cervical adenopathy.  Skin: Skin is warm and dry.          Assessment & Plan:  URI:  Cough, congestion, fatigue x 7 days. Overall slightly better today. Some improvement with OTC treatment. Exam today with clear lungs, does not appear acutely ill, vitals stable. Suspect viral involvement at this point and will treat with supportive measures. Rx for Tessalon Pearls sent to pharmacy. Hycodan cough suppressant printed for night cough and sleep. Discussed to increase fluids, rest, and notify us Monday next week if symptoms progress. She is stable for outpatient treatment. Suspect symptoms will improve.  Morrie Sheldonlark,Mical Brun Kendal, NP

## 2016-07-14 NOTE — Progress Notes (Signed)
Pre visit review using our clinic review tool, if applicable. No additional management support is needed unless otherwise documented below in the visit note. 

## 2016-07-14 NOTE — Patient Instructions (Signed)
Your symptoms are representative of a viral illness which will resolve on its own over time. Our goal is to treat your symptoms in order to aid your body in the healing process and to make you more comfortable.   You may take Benzonatate capsules for cough. Take 1 capsule by mouth three times daily as needed for cough.  You may take the Hycodan cough suppressant at bedtime as needed for cough and rest. Caution this medication contains codeine and will make you feel drowsy.  Ensure you are staying hydrated with water. Please call me Monday this week if you start to feel worse, you start running fevers, your breathing gets worse.  It was a pleasure meeting you!   Upper Respiratory Infection, Adult Most upper respiratory infections (URIs) are a viral infection of the air passages leading to the lungs. A URI affects the nose, throat, and upper air passages. The most common type of URI is nasopharyngitis and is typically referred to as "the common cold." URIs run their course and usually go away on their own. Most of the time, a URI does not require medical attention, but sometimes a bacterial infection in the upper airways can follow a viral infection. This is called a secondary infection. Sinus and middle ear infections are common types of secondary upper respiratory infections. Bacterial pneumonia can also complicate a URI. A URI can worsen asthma and chronic obstructive pulmonary disease (COPD). Sometimes, these complications can require emergency medical care and may be life threatening.  CAUSES Almost all URIs are caused by viruses. A virus is a type of germ and can spread from one person to another.  RISKS FACTORS You may be at risk for a URI if:   You smoke.   You have chronic heart or lung disease.  You have a weakened defense (immune) system.   You are very young or very old.   You have nasal allergies or asthma.  You work in crowded or poorly ventilated areas.  You work in  health care facilities or schools. SIGNS AND SYMPTOMS  Symptoms typically develop 2-3 days after you come in contact with a cold virus. Most viral URIs last 7-10 days. However, viral URIs from the influenza virus (flu virus) can last 14-18 days and are typically more severe. Symptoms may include:   Runny or stuffy (congested) nose.   Sneezing.   Cough.   Sore throat.   Headache.   Fatigue.   Fever.   Loss of appetite.   Pain in your forehead, behind your eyes, and over your cheekbones (sinus pain).  Muscle aches.  DIAGNOSIS  Your health care provider may diagnose a URI by:  Physical exam.  Tests to check that your symptoms are not due to another condition such as:  Strep throat.  Sinusitis.  Pneumonia.  Asthma. TREATMENT  A URI goes away on its own with time. It cannot be cured with medicines, but medicines may be prescribed or recommended to relieve symptoms. Medicines may help:  Reduce your fever.  Reduce your cough.  Relieve nasal congestion. HOME CARE INSTRUCTIONS   Take medicines only as directed by your health care provider.   Gargle warm saltwater or take cough drops to comfort your throat as directed by your health care provider.  Use a warm mist humidifier or inhale steam from a shower to increase air moisture. This may make it easier to breathe.  Drink enough fluid to keep your urine clear or pale yellow.   Eat soups  and other clear broths and maintain good nutrition.   Rest as needed.   Return to work when your temperature has returned to normal or as your health care provider advises. You may need to stay home longer to avoid infecting others. You can also use a face mask and careful hand washing to prevent spread of the virus.  Increase the usage of your inhaler if you have asthma.   Do not use any tobacco products, including cigarettes, chewing tobacco, or electronic cigarettes. If you need help quitting, ask your health care  provider. PREVENTION  The best way to protect yourself from getting a cold is to practice good hygiene.   Avoid oral or hand contact with people with cold symptoms.   Wash your hands often if contact occurs.  There is no clear evidence that vitamin C, vitamin E, echinacea, or exercise reduces the chance of developing a cold. However, it is always recommended to get plenty of rest, exercise, and practice good nutrition.  SEEK MEDICAL CARE IF:   You are getting worse rather than better.   Your symptoms are not controlled by medicine.   You have chills.  You have worsening shortness of breath.  You have brown or red mucus.  You have yellow or brown nasal discharge.  You have pain in your face, especially when you bend forward.  You have a fever.  You have swollen neck glands.  You have pain while swallowing.  You have white areas in the back of your throat. SEEK IMMEDIATE MEDICAL CARE IF:   You have severe or persistent:  Headache.  Ear pain.  Sinus pain.  Chest pain.  You have chronic lung disease and any of the following:  Wheezing.  Prolonged cough.  Coughing up blood.  A change in your usual mucus.  You have a stiff neck.  You have changes in your:  Vision.  Hearing.  Thinking.  Mood. MAKE SURE YOU:   Understand these instructions.  Will watch your condition.  Will get help right away if you are not doing well or get worse.   This information is not intended to replace advice given to you by your health care provider. Make sure you discuss any questions you have with your health care provider.   Document Released: 02/28/2001 Document Revised: 01/19/2015 Document Reviewed: 12/10/2013 Elsevier Interactive Patient Education [employer]

## 2016-07-27 ENCOUNTER — Other Ambulatory Visit: Payer: Self-pay | Admitting: Primary Care

## 2016-07-27 ENCOUNTER — Encounter: Payer: Self-pay | Admitting: Primary Care

## 2016-07-27 DIAGNOSIS — R059 Cough, unspecified: Secondary | ICD-10-CM

## 2016-07-27 DIAGNOSIS — R05 Cough: Secondary | ICD-10-CM

## 2016-07-27 MED ORDER — AZITHROMYCIN 250 MG PO TABS
ORAL_TABLET | ORAL | 0 refills | Status: DC
Start: 2016-07-27 — End: 2017-10-15

## 2016-12-11 ENCOUNTER — Other Ambulatory Visit: Payer: Self-pay | Admitting: Family Medicine

## 2016-12-11 NOTE — Telephone Encounter (Signed)
Refilled medications

## 2017-01-10 ENCOUNTER — Other Ambulatory Visit: Payer: Self-pay | Admitting: Family Medicine

## 2017-04-03 ENCOUNTER — Other Ambulatory Visit: Payer: Self-pay | Admitting: Family Medicine

## 2017-04-05 ENCOUNTER — Telehealth: Payer: Self-pay

## 2017-04-05 ENCOUNTER — Other Ambulatory Visit: Payer: Self-pay

## 2017-04-05 MED ORDER — VALSARTAN-HYDROCHLOROTHIAZIDE 80-12.5 MG PO TABS: 1.0000 | ORAL_TABLET | Freq: Every day | ORAL | 0 refills | Status: DC

## 2017-04-05 NOTE — Telephone Encounter (Signed)
Pt's pharmacy left vm regarding pt's valsartan to switch to something else.

## 2017-04-06 ENCOUNTER — Telehealth: Payer: Self-pay

## 2017-04-06 MED ORDER — LISINOPRIL-HYDROCHLOROTHIAZIDE 20-12.5 MG PO TABS: 1.0000 | ORAL_TABLET | Freq: Every day | ORAL | 3 refills | Status: DC

## 2017-04-06 NOTE — Telephone Encounter (Signed)
Noted. Thank you for the clarification. rx changed to prinzide 20-12.5. Please advised pt to follow up blood pressure in 2 weeks.

## 2017-04-06 NOTE — Telephone Encounter (Addendum)
FDA is still telling patients/pharmacies to check lot numbers with their web site first because this only effects certain manufacturers.  However; we are finding that the majority of pharmacies are telling all customers who are on any form of valsartan (regardless of lot number/manufacturer) to call and request a treatment change from their provider.  This is due to the fact that the FDA continues to add lot numbers and manufacturers to the lists on a regular basis.   Pharmacies, as a whole, are feeling that the safest option is to make this a widespread change.  My direction to the staff is to take a phone message for the provider and allow you to determine what is appropriate for your patient.  Many providers are going ahead and making a change.

## 2017-04-06 NOTE — Telephone Encounter (Signed)
Spoke to pt. She will keep a check on her BP and let us know how it is doing.

## 2017-04-06 NOTE — Telephone Encounter (Signed)
Mandy since you have been working on these recalls, what are we doing with these patients?  I was under the assumption that we wouldn't need to change them to a different rx.  Is that not correct?

## 2017-04-06 NOTE — Telephone Encounter (Signed)
I sent eRx for prinzide to local pharmacy as we are unsure if dose will need to be adjusted since it is a new medication.

## 2017-04-06 NOTE — Addendum Note (Signed)
Addended by: Dianne DunARON, Shavy Beachem M on: 04/06/2017 12:06 PM   Modules accepted: Orders

## 2017-04-06 NOTE — Telephone Encounter (Signed)
Received a fax from [facility] that the patient's valsartan-hctz has been recalled. They are requesting a new rx be sent in to be replaced.

## 2017-04-09 ENCOUNTER — Other Ambulatory Visit: Payer: Self-pay

## 2017-04-09 ENCOUNTER — Telehealth: Payer: Self-pay | Admitting: *Deleted

## 2017-04-09 MED ORDER — LISINOPRIL-HYDROCHLOROTHIAZIDE 20-12.5 MG PO TABS: 1.0000 | ORAL_TABLET | Freq: Every day | ORAL | 3 refills | Status: DC

## 2017-04-09 NOTE — Telephone Encounter (Signed)
Received fax from [facility] stating ALL Strengths of Valsartan-HCTZ have been voluntarily recalled by the manufacturer due to an impurity.  Please provide new Rx. FYI: Losartan-HCTZ is available.

## 2017-04-09 NOTE — Telephone Encounter (Signed)
This was changed to zestoretic last week- please see phone note. I'm not sure why we keep receiving these messages. Thanks!

## 2017-06-02 ENCOUNTER — Other Ambulatory Visit: Payer: Self-pay | Admitting: Family Medicine

## 2017-06-07 ENCOUNTER — Other Ambulatory Visit: Payer: Self-pay | Admitting: Family Medicine

## 2017-08-27 ENCOUNTER — Ambulatory Visit: Payer: BC Managed Care – PPO | Admitting: Primary Care

## 2017-08-29 ENCOUNTER — Other Ambulatory Visit: Payer: Self-pay | Admitting: Family Medicine

## 2017-10-01 ENCOUNTER — Ambulatory Visit: Payer: BC Managed Care – PPO | Admitting: Primary Care

## 2017-10-15 ENCOUNTER — Encounter: Payer: Self-pay | Admitting: Primary Care

## 2017-10-15 ENCOUNTER — Ambulatory Visit: Payer: BC Managed Care – PPO | Admitting: Primary Care

## 2017-10-15 VITALS — BP 132/78 | HR 51 | Temp 98.3°F | Wt 158.0 lb

## 2017-10-15 DIAGNOSIS — F3342 Major depressive disorder, recurrent, in full remission: Secondary | ICD-10-CM

## 2017-10-15 DIAGNOSIS — I10 Essential (primary) hypertension: Secondary | ICD-10-CM

## 2017-10-15 DIAGNOSIS — N182 Chronic kidney disease, stage 2 (mild): Secondary | ICD-10-CM | POA: Diagnosis not present

## 2017-10-15 DIAGNOSIS — E785 Hyperlipidemia, unspecified: Secondary | ICD-10-CM

## 2017-10-15 LAB — COMPREHENSIVE METABOLIC PANEL
ALK PHOS: 86 U/L (ref 39–117)
ALT: 21 U/L (ref 0–35)
AST: 16 U/L (ref 0–37)
Albumin: 4.1 g/dL (ref 3.5–5.2)
BILIRUBIN TOTAL: 1.1 mg/dL (ref 0.2–1.2)
BUN: 20 mg/dL (ref 6–23)
CO2: 31 meq/L (ref 19–32)
CREATININE: 1.36 mg/dL — AB (ref 0.40–1.20)
Calcium: 9.4 mg/dL (ref 8.4–10.5)
Chloride: 105 mEq/L (ref 96–112)
GFR: 41.68 mL/min — AB (ref >60.00)
GLUCOSE: 94 mg/dL (ref 70–99)
Potassium: 4 mEq/L (ref 3.5–5.1)
Sodium: 144 mEq/L (ref 135–145)
Total Protein: 7.2 g/dL (ref 6.0–8.3)

## 2017-10-15 LAB — LIPID PANEL
CHOL/HDL RATIO: 4
Cholesterol: 163 mg/dL (ref 0–200)
HDL: 41.4 mg/dL (ref >39.00)
LDL Cholesterol: 100 mg/dL — ABNORMAL HIGH (ref 0–99)
NONHDL: 121.41
Triglycerides: 107 mg/dL (ref 0.0–149.0)
VLDL: 21.4 mg/dL (ref 0.0–40.0)

## 2017-10-15 MED ORDER — LOSARTAN POTASSIUM-HCTZ 50-12.5 MG PO TABS: 1.0000 | ORAL_TABLET | Freq: Every day | ORAL | 0 refills | Status: DC

## 2017-10-15 MED ORDER — SIMVASTATIN 20 MG PO TABS: 20.0000 mg | ORAL_TABLET | ORAL | 0 refills | Status: DC

## 2017-10-15 MED ORDER — DULOXETINE HCL 30 MG PO CPEP: 30.0000 mg | ORAL_CAPSULE | Freq: Every day | ORAL | 0 refills | Status: DC

## 2017-10-15 NOTE — Assessment & Plan Note (Addendum)
Stable in the office today, bradycardia noted, she is asymptomatic. HR seems to be stable on prior exams.   Switch from lisinopril-HCTZ to losartan-HCTZ due to ACE induced cough. She will update BP readings in a few weeks.   BMP pending.

## 2017-10-15 NOTE — Patient Instructions (Signed)
Start losartan-HCTZ 50/12.5 mg once daily once received through the mail order.  Start monitoring your blood pressure daily, around the same time of day, for the next 2 weeks.  Ensure that you have rested for 30 minutes prior to checking your blood pressure. Record your readings and send them to me via My Chart.   Also take note of your heart rate.  Stop by the lab prior to leaving today. I will notify you of your results once received.   It was a pleasure meeting you!

## 2017-10-15 NOTE — Assessment & Plan Note (Signed)
BMP pending today. Managed on ACE, will switch to ARB due to cough.

## 2017-10-15 NOTE — Progress Notes (Signed)
Subjective:    Patient ID: Heather [name], female    DOB: 1956/03/29, 62 y.o.   MRN: [id number]  HPI  Heather [name] is a 62 year old female who presents today to transfer care from Dr. Dayton Martes.  1) Depression: Currently managed on Cymbalta 30 mg once daily, rather than three times daily. She was previously following with psychiatrist who had her at the higher dose. She feels well managed on 30 mg. Denies SI/HI.  2) Hyperlipidemia: Currently managed on Simvastatin 20 mg for which she takes every other day due to history of myalgias. Her last lipid panel was in April 2017 with TC of 156 and LDL 91. She denies myalgias on this regimen.  3) Essential Hypertension: Currently managed on lisinopril-HCTZ 20/12.5 mg and atenolol 25 mg. She denies dizziness, chest pain, shortness of breath. She has noticed a dry cough that has been present for the past one month for which she describes as a dry tickle. This is bothersome.   BP Readings from Last 3 Encounters:  10/15/17 132/78  07/14/16 132/84  12/28/15 112/60   4) GERD: Currently managed on omeprazole 20 mg daily. Without the medication she'll experience esophageal reflux with burning. She feels well managed on this dose.   Review of Systems  Constitutional: Negative for fever.  HENT: Negative for congestion and sore throat.   Respiratory: Positive for cough. Negative for shortness of breath.        Dry, tickle, cough  Cardiovascular: Negative for chest pain.  Gastrointestinal:       GERD  Neurological: Negative for dizziness and numbness.       Past Medical History:  Diagnosis Date  . B12 deficiency   . Chronic kidney disease   . Depression   . GERD (gastroesophageal reflux disease)   . Hyperlipidemia   . Hypertension    had ankle swelling with amlodipine  . RLS (restless legs syndrome)      Social History   Socioeconomic History  . Marital status: Married    Spouse name: Not on file  . Number of children: Not on file  . Years  of education: Not on file  . Highest education level: Not on file  Social Needs  . Financial resource strain: Not on file  . Food insecurity - worry: Not on file  . Food insecurity - inability: Not on file  . Transportation needs - medical: Not on file  . Transportation needs - non-medical: Not on file  Occupational History  . Occupation: retired Ambulance person: Retired  . Occupation: HR (part-time)    Employer: [employer]  Tobacco Use  . Smoking status: Never Smoker  . Smokeless tobacco: Never Used  Substance and Sexual Activity  . Alcohol use: No  . Drug use: No  . Sexual activity: Not on file  Other Topics Concern  . Not on file  Social History Narrative   Works part-time in [employer] for [employer] in [employer] in [place]      Married    Past Surgical History:  Procedure Laterality Date  . SALPINGOOPHORECTOMY     right; laproscopic    Family History  Problem Relation Age of Onset  . Hypertension Mother   . COPD Father   . Heart attack Father 71       developed ischemic cardiomyopathy  . Heart attack Mother 52       sudden cardiac death    Allergies  Allergen  Reactions  . Augmentin [Amoxicillin-Pot Clavulanate] Diarrhea    Current Outpatient Medications on File Prior to Visit  Medication Sig Dispense Refill  . acetaminophen (TYLENOL) 325 MG tablet Take 650 mg by mouth every 6 (six) hours as needed.      Marland Kitchen. atenolol (TENORMIN) 25 MG tablet TAKE 1 TABLET DAILY. MUST  SCHEDULE OFFICE VISIT 90 tablet 0  . lisinopril-hydrochlorothiazide (ZESTORETIC) 20-12.5 MG tablet Take 1 tablet by mouth daily. 90 tablet 3  . omeprazole (PRILOSEC) 20 MG capsule Take 1 capsule (20 mg total) by mouth 2 (two) times daily. 180 capsule 3   No current facility-administered medications on file prior to visit.     BP 132/78   Pulse (!) 51   Temp 98.3 F (36.8 C) (Oral)   Wt 158 lb (71.7 kg)   SpO2 97%   BMI 26.09 kg/m    Objective:   Physical  Exam  Constitutional: She appears well-nourished. She does not appear ill.  Neck: Neck supple.  Cardiovascular: Normal rate and regular rhythm.  Pulmonary/Chest: Effort normal and breath sounds normal. She has no wheezes.  Skin: Skin is warm and dry.  Psychiatric: She has a normal mood and affect.          Assessment & Plan:

## 2017-10-15 NOTE — Assessment & Plan Note (Signed)
Doing well on Cymbalta, continue same. 

## 2017-10-15 NOTE — Assessment & Plan Note (Signed)
Lipid panel pending today. 

## 2017-11-06 ENCOUNTER — Telehealth: Payer: Self-pay | Admitting: Primary Care

## 2017-11-06 NOTE — Telephone Encounter (Signed)
Message left for patient to return my call.  Also sending message through MyChart

## 2017-11-06 NOTE — Telephone Encounter (Addendum)
-----   Message from Doreene NestKatherine K Blanca Carreon, NP sent at 10/15/2017 11:52 AM EST ----- Regarding: BP Will you please check on patient's BP since switching from lisinopril-HCTZ to losartan-HCTZ. Any improvement in cough? What's her heart rate running?

## 2017-11-09 NOTE — Telephone Encounter (Signed)
Noted  

## 2017-11-19 ENCOUNTER — Encounter: Payer: Self-pay | Admitting: Primary Care

## 2017-11-26 ENCOUNTER — Other Ambulatory Visit: Payer: Self-pay | Admitting: Family Medicine

## 2017-11-26 DIAGNOSIS — I10 Essential (primary) hypertension: Secondary | ICD-10-CM

## 2017-11-26 DIAGNOSIS — E785 Hyperlipidemia, unspecified: Secondary | ICD-10-CM

## 2017-11-27 NOTE — Telephone Encounter (Signed)
Refill sent to pharmacy.   

## 2017-12-17 ENCOUNTER — Encounter: Payer: Self-pay | Admitting: Primary Care

## 2017-12-17 ENCOUNTER — Ambulatory Visit: Payer: BC Managed Care – PPO | Admitting: Primary Care

## 2017-12-17 VITALS — BP 112/70 | HR 59 | Temp 98.2°F | Resp 18 | Ht 65.25 in | Wt 158.5 lb

## 2017-12-17 DIAGNOSIS — J069 Acute upper respiratory infection, unspecified: Secondary | ICD-10-CM | POA: Diagnosis not present

## 2017-12-17 MED ORDER — HYDROCOD POLST-CPM POLST ER 10-8 MG/5ML PO SUER: 5.0000 mL | Freq: Two times a day (BID) | ORAL | 0 refills | Status: DC | PRN

## 2017-12-17 NOTE — Patient Instructions (Signed)
Your symptoms are representative of a viral illness which will resolve on its own over time. Our goal is to treat your symptoms in order to aid your body in the healing process and to make you more comfortable.   Try taking Delsym for cough during the day.  You may take the Tussionex cough suppressant every 12 hours as needed for cough and rest. Caution this medication contains codeine and will make you feel drowsy.  Make sure to drink plenty of water and rest.  Please call me Thursday this week if no improvement in your symptoms.  It was a pleasure to see you today!   Upper Respiratory Infection, Adult Most upper respiratory infections (URIs) are caused by a virus. A URI affects the nose, throat, and upper air passages. The most common type of URI is often called "the common cold." Follow these instructions at home:  Take medicines only as told by your doctor.  Gargle warm saltwater or take cough drops to comfort your throat as told by your doctor.  Use a warm mist humidifier or inhale steam from a shower to increase air moisture. This may make it easier to breathe.  Drink enough fluid to keep your pee (urine) clear or pale yellow.  Eat soups and other clear broths.  Have a healthy diet.  Rest as needed.  Go back to work when your fever is gone or your doctor says it is okay. ? You may need to stay home longer to avoid giving your URI to others. ? You can also wear a face mask and wash your hands often to prevent spread of the virus.  Use your inhaler more if you have asthma.  Do not use any tobacco products, including cigarettes, chewing tobacco, or electronic cigarettes. If you need help quitting, ask your doctor. Contact a doctor if:  You are getting worse, not better.  Your symptoms are not helped by medicine.  You have chills.  You are getting more short of breath.  You have brown or red mucus.  You have yellow or brown discharge from your nose.  You have  pain in your face, especially when you bend forward.  You have a fever.  You have puffy (swollen) neck glands.  You have pain while swallowing.  You have white areas in the back of your throat. Get help right away if:  You have very bad or constant: ? Headache. ? Ear pain. ? Pain in your forehead, behind your eyes, and over your cheekbones (sinus pain). ? Chest pain.  You have long-lasting (chronic) lung disease and any of the following: ? Wheezing. ? Long-lasting cough. ? Coughing up blood. ? A change in your usual mucus.  You have a stiff neck.  You have changes in your: ? Vision. ? Hearing. ? Thinking. ? Mood. This information is not intended to replace advice given to you by your health care provider. Make sure you discuss any questions you have with your health care provider. Document Released: 02/21/2008 Document Revised: 05/07/2016 Document Reviewed: 12/10/2013 Elsevier Interactive Patient Education  2018 [employer]

## 2017-12-17 NOTE — Progress Notes (Signed)
Subjective:    Patient ID: Heather [name], female    DOB: Jan 30, 1956, 62 y.o.   MRN: [id number]  HPI  Heather [name] is a very pleasant 62 year old female with a history of GERD, hypertension who presents today with a chief complaint of cough.  She also reports nasal congestion, watery eyes, sore throat, left ear pain. Her symptoms began 5 days ago. She's been around her grandson who had similar symptoms who has improved. She denies fevers, chills, body aches. She's tried taking Nyquil, Dayquil without much improvement. Her cough is worse at night.  Review of Systems  Constitutional: Negative for chills, fatigue and fever.  HENT: Positive for ear pain. Negative for sore throat.   Eyes:       Watery eyes  Respiratory: Positive for cough. Negative for shortness of breath.   Cardiovascular: Negative for chest pain.       Past Medical History:  Diagnosis Date  . B12 deficiency   . Chronic kidney disease   . Depression   . GERD (gastroesophageal reflux disease)   . Hyperlipidemia   . Hypertension    had ankle swelling with amlodipine  . RLS (restless legs syndrome)      Social History   Socioeconomic History  . Marital status: Married    Spouse name: Not on file  . Number of children: Not on file  . Years of education: Not on file  . Highest education level: Not on file  Occupational History  . Occupation: retired Ambulance person: Retired  . Occupation: HR (part-time)    Employer: [employer]  Social Needs  . Financial resource strain: Not on file  . Food insecurity:    Worry: Not on file    Inability: Not on file  . Transportation needs:    Medical: Not on file    Non-medical: Not on file  Tobacco Use  . Smoking status: Never Smoker  . Smokeless tobacco: Never Used  Substance and Sexual Activity  . Alcohol use: No  . Drug use: No  . Sexual activity: Not on file  Lifestyle  . Physical activity:    Days per week: Not on file    Minutes per session:  Not on file  . Stress: Not on file  Relationships  . Social connections:    Talks on phone: Not on file    Gets together: Not on file    Attends religious service: Not on file    Active member of club or organization: Not on file    Attends meetings of clubs or organizations: Not on file    Relationship status: Not on file  . Intimate partner violence:    Fear of current or ex partner: Not on file    Emotionally abused: Not on file    Physically abused: Not on file    Forced sexual activity: Not on file  Other Topics Concern  . Not on file  Social History Narrative   Works part-time in [employer] for [employer] in [employer] in [place]      Married    Past Surgical History:  Procedure Laterality Date  . SALPINGOOPHORECTOMY     right; laproscopic    Family History  Problem Relation Age of Onset  . Hypertension Mother   . COPD Father   . Heart attack Father 91       developed ischemic cardiomyopathy  . Heart attack Mother 52  sudden cardiac death    Allergies  Allergen Reactions  . Augmentin [Amoxicillin-Pot Clavulanate] Diarrhea    Current Outpatient Medications on File Prior to Visit  Medication Sig Dispense Refill  . acetaminophen (TYLENOL) 325 MG tablet Take 650 mg by mouth every 6 (six) hours as needed.      Marland Kitchen. atenolol (TENORMIN) 25 MG tablet Take 1 tablet (25 mg total) by mouth daily. 90 tablet 2  . DULoxetine (CYMBALTA) 30 MG capsule Take 1 capsule (30 mg total) by mouth daily. 90 capsule 0  . losartan-hydrochlorothiazide (HYZAAR) 50-12.5 MG tablet Take 1 tablet by mouth daily. 90 tablet 0  . omeprazole (PRILOSEC) 20 MG capsule Take 1 capsule (20 mg total) by mouth 2 (two) times daily. 180 capsule 3  . simvastatin (ZOCOR) 20 MG tablet TAKE 1 TABLET ONCE EVERY   EVENING 90 tablet 2   No current facility-administered medications on file prior to visit.     BP 112/70   Pulse (!) 59   Temp 98.2 F (36.8 C) (Oral)   Resp 18   Ht 5' 5.25"  (1.657 m)   Wt 158 lb 8 oz (71.9 kg)   SpO2 94%   BMI 26.17 kg/m    Objective:   Physical Exam  Constitutional: She appears well-nourished.  HENT:  Nose: Right sinus exhibits no maxillary sinus tenderness and no frontal sinus tenderness. Left sinus exhibits no maxillary sinus tenderness and no frontal sinus tenderness.  Mouth/Throat: Oropharynx is clear and moist.  Bilateral canals with cerumen impaction. TM to left canal unremarkable, residual cerumen remaining to right canal without visualization of TM post irrigation.  Eyes: Conjunctivae are normal.  Neck: Neck supple.  Cardiovascular: Normal rate and regular rhythm.  Pulmonary/Chest: Effort normal and breath sounds normal. She has no wheezes. She has no rales.  Dry cough during exam  Lymphadenopathy:    She has no cervical adenopathy.  Skin: Skin is warm and dry.          Assessment & Plan:  URI:  Cough, nasal congestion x 5 days. Exam today overall unremarkable, dry cough during exam. Suspect viral URI and will treat with conservative measures. Rx for Tussionex sent to pharmacy as I suspect a good night's sleep will aid in recovery. Discussed Delsym for daytime use. She will update Thursday this week if no improvement in symptoms, at that point consider Zpak.  Doreene NestKatherine K Torre [place], NP

## 2018-01-08 ENCOUNTER — Other Ambulatory Visit: Payer: Self-pay | Admitting: Primary Care

## 2018-01-08 DIAGNOSIS — I10 Essential (primary) hypertension: Secondary | ICD-10-CM

## 2018-01-08 MED ORDER — LOSARTAN POTASSIUM-HCTZ 50-12.5 MG PO TABS: 1.0000 | ORAL_TABLET | Freq: Every day | ORAL | 1 refills | Status: DC

## 2018-06-05 ENCOUNTER — Other Ambulatory Visit: Payer: Self-pay | Admitting: Primary Care

## 2018-06-05 DIAGNOSIS — Z1231 Encounter for screening mammogram for malignant neoplasm of breast: Secondary | ICD-10-CM

## 2018-07-08 ENCOUNTER — Ambulatory Visit
Admission: RE | Admit: 2018-07-08 | Discharge: 2018-07-08 | Disposition: A | Discharge status: Home / Self Care | Payer: BC Managed Care – PPO | Source: Ambulatory Visit | Attending: Primary Care | Admitting: Primary Care

## 2018-07-08 DIAGNOSIS — Z1231 Encounter for screening mammogram for malignant neoplasm of breast: Secondary | ICD-10-CM

## 2018-08-21 ENCOUNTER — Other Ambulatory Visit: Payer: Self-pay | Admitting: Primary Care

## 2018-08-21 DIAGNOSIS — I10 Essential (primary) hypertension: Secondary | ICD-10-CM

## 2018-09-23 ENCOUNTER — Other Ambulatory Visit: Payer: Self-pay | Admitting: Primary Care

## 2018-09-23 DIAGNOSIS — E538 Deficiency of other specified B group vitamins: Secondary | ICD-10-CM

## 2018-09-23 DIAGNOSIS — I10 Essential (primary) hypertension: Secondary | ICD-10-CM

## 2018-09-23 DIAGNOSIS — E785 Hyperlipidemia, unspecified: Secondary | ICD-10-CM

## 2018-10-04 ENCOUNTER — Other Ambulatory Visit (INDEPENDENT_AMBULATORY_CARE_PROVIDER_SITE_OTHER): Payer: BC Managed Care – PPO

## 2018-10-04 DIAGNOSIS — E538 Deficiency of other specified B group vitamins: Secondary | ICD-10-CM | POA: Diagnosis not present

## 2018-10-04 DIAGNOSIS — E785 Hyperlipidemia, unspecified: Secondary | ICD-10-CM | POA: Diagnosis not present

## 2018-10-04 DIAGNOSIS — I10 Essential (primary) hypertension: Secondary | ICD-10-CM

## 2018-10-04 LAB — COMPREHENSIVE METABOLIC PANEL
ALBUMIN: 4 g/dL (ref 3.5–5.2)
ALT: 18 U/L (ref 0–35)
AST: 18 U/L (ref 0–37)
Alkaline Phosphatase: 91 U/L (ref 39–117)
BUN: 19 mg/dL (ref 6–23)
CO2: 28 mEq/L (ref 19–32)
Calcium: 9.2 mg/dL (ref 8.4–10.5)
Chloride: 106 mEq/L (ref 96–112)
Creatinine, Ser: 1.33 mg/dL — ABNORMAL HIGH (ref 0.40–1.20)
GFR: 40.12 mL/min — ABNORMAL LOW (ref >60.00)
Glucose, Bld: 107 mg/dL — ABNORMAL HIGH (ref 70–99)
Potassium: 3.8 mEq/L (ref 3.5–5.1)
Sodium: 143 mEq/L (ref 135–145)
Total Bilirubin: 1 mg/dL (ref 0.2–1.2)
Total Protein: 6.9 g/dL (ref 6.0–8.3)

## 2018-10-04 LAB — LIPID PANEL
Cholesterol: 169 mg/dL (ref 0–200)
HDL: 42.8 mg/dL (ref >39.00)
LDL CALC: 114 mg/dL — AB (ref 0–99)
NonHDL: 126.62
Total CHOL/HDL Ratio: 4
Triglycerides: 65 mg/dL (ref 0.0–149.0)
VLDL: 13 mg/dL (ref 0.0–40.0)

## 2018-10-04 LAB — VITAMIN B12: VITAMIN B 12: 149 pg/mL — AB (ref 211–911)

## 2018-10-08 ENCOUNTER — Encounter: Payer: BC Managed Care – PPO | Admitting: Primary Care

## 2018-10-11 ENCOUNTER — Encounter: Payer: Self-pay | Admitting: Primary Care

## 2018-10-11 ENCOUNTER — Other Ambulatory Visit (HOSPITAL_COMMUNITY)
Admission: RE | Admit: 2018-10-11 | Discharge: 2018-10-11 | Disposition: A | Discharge status: Home / Self Care | Payer: BC Managed Care – PPO | Source: Ambulatory Visit | Attending: Primary Care | Admitting: Primary Care

## 2018-10-11 ENCOUNTER — Ambulatory Visit (INDEPENDENT_AMBULATORY_CARE_PROVIDER_SITE_OTHER): Payer: BC Managed Care – PPO | Admitting: Primary Care

## 2018-10-11 VITALS — BP 122/76 | HR 64 | Temp 98.0°F | Ht 65.25 in | Wt 157.5 lb

## 2018-10-11 DIAGNOSIS — Z124 Encounter for screening for malignant neoplasm of cervix: Secondary | ICD-10-CM | POA: Diagnosis not present

## 2018-10-11 DIAGNOSIS — H3530 Unspecified macular degeneration: Secondary | ICD-10-CM | POA: Insufficient documentation

## 2018-10-11 DIAGNOSIS — R739 Hyperglycemia, unspecified: Secondary | ICD-10-CM | POA: Diagnosis not present

## 2018-10-11 DIAGNOSIS — Z23 Encounter for immunization: Secondary | ICD-10-CM | POA: Diagnosis not present

## 2018-10-11 DIAGNOSIS — F3342 Major depressive disorder, recurrent, in full remission: Secondary | ICD-10-CM

## 2018-10-11 DIAGNOSIS — E538 Deficiency of other specified B group vitamins: Secondary | ICD-10-CM

## 2018-10-11 DIAGNOSIS — Z0000 Encounter for general adult medical examination without abnormal findings: Secondary | ICD-10-CM | POA: Insufficient documentation

## 2018-10-11 DIAGNOSIS — Z1211 Encounter for screening for malignant neoplasm of colon: Secondary | ICD-10-CM

## 2018-10-11 DIAGNOSIS — E785 Hyperlipidemia, unspecified: Secondary | ICD-10-CM

## 2018-10-11 DIAGNOSIS — I10 Essential (primary) hypertension: Secondary | ICD-10-CM | POA: Diagnosis not present

## 2018-10-11 DIAGNOSIS — R159 Full incontinence of feces: Secondary | ICD-10-CM

## 2018-10-11 DIAGNOSIS — N182 Chronic kidney disease, stage 2 (mild): Secondary | ICD-10-CM

## 2018-10-11 LAB — POCT GLYCOSYLATED HEMOGLOBIN (HGB A1C): HEMOGLOBIN A1C: 5.6 % (ref 4.0–5.6)

## 2018-10-11 MED ORDER — CYANOCOBALAMIN 1000 MCG/ML IJ SOLN
1000.0000 ug | Freq: Once | INTRAMUSCULAR | Status: AC
  Administered 2018-10-11: 1000 ug via INTRAMUSCULAR

## 2018-10-11 MED ORDER — SIMVASTATIN 40 MG PO TABS: 40.0000 mg | ORAL_TABLET | Freq: Every day | ORAL | 3 refills | Status: DC

## 2018-10-11 MED ORDER — ZOSTER VAC RECOMB ADJUVANTED 50 MCG/0.5ML IM SUSR: 0.5000 mL | Freq: Once | INTRAMUSCULAR | 1 refills | Status: AC

## 2018-10-11 MED ORDER — LOSARTAN POTASSIUM 50 MG PO TABS: 50.0000 mg | ORAL_TABLET | Freq: Every day | ORAL | 3 refills | Status: DC

## 2018-10-11 MED ORDER — DULOXETINE HCL 30 MG PO CPEP: 30.0000 mg | ORAL_CAPSULE | Freq: Every day | ORAL | 3 refills | Status: DC

## 2018-10-11 NOTE — Assessment & Plan Note (Addendum)
Recent B12 level low with level of 149, once received injections. Not taking oral supplements. Given low level, will re-initiate B12 injections x 6 months with first injection today. Repeat B12 lab in 3 months.

## 2018-10-11 NOTE — Assessment & Plan Note (Signed)
Chronic and stable. Will remove HCTZ from blood pressure regimen to prevent further decline.  Repeat in 3 months.

## 2018-10-11 NOTE — Assessment & Plan Note (Signed)
Doing well on Cymbalta, continue same. Refill sent to pharmacy.

## 2018-10-11 NOTE — Patient Instructions (Addendum)
Your vitamin B 12 is too low. You were provided with an injection of B12 today.   We've changed your blood pressure medication to plain losartan 50 mg. I sent this to your pharmacy. Take 1 tablet once daily. Monitor your blood pressure and notify me if you see readings at or above 135/90.  We've increased the dose of your Simvastatin to 40 mg daily. I sent a new prescription to your pharmacy.  You were provided with a tetanus vaccination which will cover you for 10 years.  You will be contacted regarding your referral to GI for the colonoscopy.  Please let us know if you have not been contacted within one week.   We will be in touch once we receive your pap smear results.  Schedule nurse visits for B12 injections for the next 5 months.   Schedule a lab only appointment for 3 months for repeat B12 lab testing. Make sure to get the lab drawn before your injection.  It was a pleasure to see you today!   Preventive Care 40-64 Years, Female Preventive care refers to lifestyle choices and visits with your health care provider that can promote health and wellness. What does preventive care include?   A yearly physical exam. This is also called an annual well check.  Dental exams once or twice a year.  Routine eye exams. Ask your health care provider how often you should have your eyes checked.  Personal lifestyle choices, including: ? Daily care of your teeth and gums. ? Regular physical activity. ? Eating a healthy diet. ? Avoiding tobacco and drug use. ? Limiting alcohol use. ? Practicing safe sex. ? Taking low-dose aspirin daily starting at age 65. ? Taking vitamin and mineral supplements as recommended by your health care provider. What happens during an annual well check? The services and screenings done by your health care provider during your annual well check will depend on your age, overall health, lifestyle risk factors, and family history of disease. Counseling Your  health care provider may ask you questions about your:  Alcohol use.  Tobacco use.  Drug use.  Emotional well-being.  Home and relationship well-being.  Sexual activity.  Eating habits.  Work and work Statistician.  Method of birth control.  Menstrual cycle.  Pregnancy history. Screening You may have the following tests or measurements:  Height, weight, and BMI.  Blood pressure.  Lipid and cholesterol levels. These may be checked every 5 years, or more frequently if you are over 65 years old.  Skin check.  Lung cancer screening. You may have this screening every year starting at age 71 if you have a 30-pack-year history of smoking and currently smoke or have quit within the past 15 years.  Colorectal cancer screening. All adults should have this screening starting at age 107 and continuing until age 18. Your health care provider may recommend screening at age 44. You will have tests every 1-10 years, depending on your results and the type of screening test. People at increased risk should start screening at an earlier age. Screening tests may include: ? Guaiac-based fecal occult blood testing. ? Fecal immunochemical test (FIT). ? Stool DNA test. ? Virtual colonoscopy. ? Sigmoidoscopy. During this test, a flexible tube with a tiny camera (sigmoidoscope) is used to examine your rectum and lower colon. The sigmoidoscope is inserted through your anus into your rectum and lower colon. ? Colonoscopy. During this test, a long, thin, flexible tube with a tiny camera (colonoscope) is used  to examine your entire colon and rectum.  Hepatitis C blood test.  Hepatitis B blood test.  Sexually transmitted disease (STD) testing.  Diabetes screening. This is done by checking your blood sugar (glucose) after you have not eaten for a while (fasting). You may have this done every 1-3 years.  Mammogram. This may be done every 1-2 years. Talk to your health care provider about when you  should start having regular mammograms. This may depend on whether you have a family history of breast cancer.  BRCA-related cancer screening. This may be done if you have a family history of breast, ovarian, tubal, or peritoneal cancers.  Pelvic exam and Pap test. This may be done every 3 years starting at age 35. Starting at age 58, this may be done every 5 years if you have a Pap test in combination with an HPV test.  Bone density scan. This is done to screen for osteoporosis. You may have this scan if you are at high risk for osteoporosis. Discuss your test results, treatment options, and if necessary, the need for more tests with your health care provider. Vaccines Your health care provider may recommend certain vaccines, such as:  Influenza vaccine. This is recommended every year.  Tetanus, diphtheria, and acellular pertussis (Tdap, Td) vaccine. You may need a Td booster every 10 years.  Varicella vaccine. You may need this if you have not been vaccinated.  Zoster vaccine. You may need this after age 1.  Measles, mumps, and rubella (MMR) vaccine. You may need at least one dose of MMR if you were born in 1957 or later. You may also need a second dose.  Pneumococcal 13-valent conjugate (PCV13) vaccine. You may need this if you have certain conditions and were not previously vaccinated.  Pneumococcal polysaccharide (PPSV23) vaccine. You may need one or two doses if you smoke cigarettes or if you have certain conditions.  Meningococcal vaccine. You may need this if you have certain conditions.  Hepatitis A vaccine. You may need this if you have certain conditions or if you travel or work in places where you may be exposed to hepatitis A.  Hepatitis B vaccine. You may need this if you have certain conditions or if you travel or work in places where you may be exposed to hepatitis B.  Haemophilus influenzae type b (Hib) vaccine. You may need this if you have certain conditions. Talk  to your health care provider about which screenings and vaccines you need and how often you need them. This information is not intended to replace advice given to you by your health care provider. Make sure you discuss any questions you have with your health care provider. Document Released: 10/01/2015 Document Revised: 10/25/2017 Document Reviewed: 07/06/2015 Elsevier Interactive Patient Education  2019 [employer].

## 2018-10-11 NOTE — Addendum Note (Signed)
Addended by: Tawnya Crook on: 10/11/2018 10:24 AM   Modules accepted: Orders

## 2018-10-11 NOTE — Assessment & Plan Note (Signed)
LDL of 114, ASCVD risk score of 6.2%. family history of CVA. Increase simvastatin to 40 mg. Work on regular exercise and diet.  Repeat lipids in 3 months.

## 2018-10-11 NOTE — Assessment & Plan Note (Signed)
Referral placed to GI for further evaluation. She is also due for colonoscopy.

## 2018-10-11 NOTE — Progress Notes (Signed)
Subjective:    Patient ID: Heather [name], female    DOB: September 03, 1956, 63 y.o.   MRN: [id number]  HPI  Heather [name] is a 63 year old female who presents today for complete physical.  Immunizations: -Tetanus: Unsure, believes it's been over 10 years.  -Influenza: Declines  -Shingles: Never completed    Diet: She endorses a fair diet Breakfast: Eggs, sausage, grits, bacon Lunch: Skips Dinner: Meat, vegetable, starch Snacks: Ice cream Desserts: Daily  Beverages: Sweet tea (less sugar), soda, no water  Exercise: She is not exercising  Eye exam: Completed in 2019 Dental exam: Completes semi-annually  Colonoscopy: Completed in 2004,  Pap Smear: Completed in 2015 Mammogram: Completed in October 2019  BP Readings from Last 3 Encounters:  10/11/18 122/76  12/17/17 112/70  10/15/17 132/78   The 10-year ASCVD risk score Denman George DC Jr., et al., 2013) is: 6.2%   Values used to calculate the score:     Age: 53 years     Sex: Female     Is Non-Hispanic African American: No     Diabetic: No     Tobacco smoker: No     Systolic Blood Pressure: 122 mmHg     Is BP treated: Yes     HDL Cholesterol: 42.8 mg/dL     Total Cholesterol: 169 mg/dL   Review of Systems  Constitutional: Negative for unexpected weight change.  HENT: Negative for rhinorrhea.   Respiratory: Negative for cough and shortness of breath.   Cardiovascular: Negative for chest pain.  Gastrointestinal: Negative for constipation and diarrhea.       GERD, intermittent. Fecal leakage  Genitourinary: Negative for difficulty urinating.  Musculoskeletal: Negative for arthralgias and myalgias.  Skin: Negative for rash.  Allergic/Immunologic: Negative for environmental allergies.  Neurological: Negative for dizziness, numbness and headaches.  Psychiatric/Behavioral: The patient is not nervous/anxious.        Past Medical History:  Diagnosis Date  . B12 deficiency   . Chronic kidney disease   . Depression   . GERD  (gastroesophageal reflux disease)   . Hyperlipidemia   . Hypertension    had ankle swelling with amlodipine  . RLS (restless legs syndrome)      Social History   Socioeconomic History  . Marital status: Married    Spouse name: Not on file  . Number of children: Not on file  . Years of education: Not on file  . Highest education level: Not on file  Occupational History  . Occupation: retired Ambulance person: Retired  . Occupation: HR (part-time)    Employer: [employer]  Social Needs  . Financial resource strain: Not on file  . Food insecurity:    Worry: Not on file    Inability: Not on file  . Transportation needs:    Medical: Not on file    Non-medical: Not on file  Tobacco Use  . Smoking status: Never Smoker  . Smokeless tobacco: Never Used  Substance and Sexual Activity  . Alcohol use: No  . Drug use: No  . Sexual activity: Not on file  Lifestyle  . Physical activity:    Days per week: Not on file    Minutes per session: Not on file  . Stress: Not on file  Relationships  . Social connections:    Talks on phone: Not on file    Gets together: Not on file    Attends religious service: Not on file    Active member of  club or organization: Not on file    Attends meetings of clubs or organizations: Not on file    Relationship status: Not on file  . Intimate partner violence:    Fear of current or ex partner: Not on file    Emotionally abused: Not on file    Physically abused: Not on file    Forced sexual activity: Not on file  Other Topics Concern  . Not on file  Social History Narrative   Works part-time in [employer] for [employer] in [employer] in [place]      Married    Past Surgical History:  Procedure Laterality Date  . SALPINGOOPHORECTOMY     right; laproscopic    Family History  Problem Relation Age of Onset  . Hypertension Mother   . Heart attack Mother 72       sudden cardiac death  . COPD Father   . Heart attack  Father 41       developed ischemic cardiomyopathy    Allergies  Allergen Reactions  . Augmentin [Amoxicillin-Pot Clavulanate] Diarrhea    Current Outpatient Medications on File Prior to Visit  Medication Sig Dispense Refill  . acetaminophen (TYLENOL) 325 MG tablet Take 650 mg by mouth every 6 (six) hours as needed.      Marland Kitchen atenolol (TENORMIN) 25 MG tablet Take 1 tablet (25 mg total) by mouth daily. 90 tablet 2  . losartan-hydrochlorothiazide (HYZAAR) 50-12.5 MG tablet Take 1 tablet by mouth daily. NEED APPOINTMENT FOR ANY MORE REFILLS 90 tablet 0  . omeprazole (PRILOSEC) 20 MG capsule Take 1 capsule (20 mg total) by mouth 2 (two) times daily. 180 capsule 3   No current facility-administered medications on file prior to visit.     BP 122/76   Pulse 64   Temp 98 F (36.7 C) (Oral)   Ht 5' 5.25" (1.657 m)   Wt 157 lb 8 oz (71.4 kg)   SpO2 98%   BMI 26.01 kg/m    Objective:   Physical Exam  Constitutional: She is oriented to person, place, and time. She appears well-nourished.  HENT:  Mouth/Throat: No oropharyngeal exudate.  Eyes: Pupils are equal, round, and reactive to light. EOM are normal.  Neck: Neck supple. No thyromegaly present.  Cardiovascular: Normal rate and regular rhythm.  Respiratory: Effort normal and breath sounds normal.  GI: Soft. Bowel sounds are normal. There is no abdominal tenderness.  Genitourinary: There is no tenderness or lesion on the right labia. There is no tenderness or lesion on the left labia. Cervix exhibits no motion tenderness and no discharge.    No vaginal discharge or erythema.  No erythema in the vagina.  Musculoskeletal: Normal range of motion.  Neurological: She is alert and oriented to person, place, and time.  Skin: Skin is warm and dry.  Psychiatric: She has a normal mood and affect.           Assessment & Plan:

## 2018-10-11 NOTE — Assessment & Plan Note (Signed)
Stable in the office today, given CKD will remove HCTZ. Plain losartan 50 mg sent to pharmacy. She will update if BP elevates.

## 2018-10-11 NOTE — Assessment & Plan Note (Signed)
Noted on fasting labs. °A1C pending. °

## 2018-10-11 NOTE — Assessment & Plan Note (Signed)
Diagnosed in May 2019, following with ophthalmology.

## 2018-10-11 NOTE — Assessment & Plan Note (Signed)
Tetanus due, provided today. Rx for shingles vaccination provided. Declines influenza vaccination. Pap smear due, completed and pending. Mammogram UTD. Recommended regular exercise, healthy diet. Exam unremarkable. Labs reviewed. Follow up in 1 year for CPE.

## 2018-10-15 LAB — CYTOLOGY - PAP
Diagnosis: NEGATIVE
HPV: NOT DETECTED

## 2018-11-08 ENCOUNTER — Other Ambulatory Visit: Payer: Self-pay | Admitting: Primary Care

## 2018-11-08 DIAGNOSIS — I10 Essential (primary) hypertension: Secondary | ICD-10-CM

## 2018-11-14 ENCOUNTER — Ambulatory Visit (INDEPENDENT_AMBULATORY_CARE_PROVIDER_SITE_OTHER): Payer: BC Managed Care – PPO | Admitting: Primary Care

## 2018-11-14 DIAGNOSIS — E538 Deficiency of other specified B group vitamins: Secondary | ICD-10-CM | POA: Diagnosis not present

## 2018-11-14 MED ORDER — CYANOCOBALAMIN 1000 MCG/ML IJ SOLN
1000.0000 ug | Freq: Once | INTRAMUSCULAR | Status: AC
  Administered 2018-11-14: 1000 ug via INTRAMUSCULAR

## 2018-11-14 NOTE — Progress Notes (Signed)
Per orders of K ate Clark NP, injection of Vit B 12 given by Ross Bender. Patient tolerated injection well. 

## 2018-11-25 ENCOUNTER — Other Ambulatory Visit: Payer: Self-pay | Admitting: Primary Care

## 2018-12-18 ENCOUNTER — Ambulatory Visit: Payer: BC Managed Care – PPO

## 2019-01-21 ENCOUNTER — Ambulatory Visit (INDEPENDENT_AMBULATORY_CARE_PROVIDER_SITE_OTHER): Payer: BC Managed Care – PPO

## 2019-01-21 ENCOUNTER — Other Ambulatory Visit (INDEPENDENT_AMBULATORY_CARE_PROVIDER_SITE_OTHER): Payer: BC Managed Care – PPO

## 2019-01-21 DIAGNOSIS — N182 Chronic kidney disease, stage 2 (mild): Secondary | ICD-10-CM

## 2019-01-21 DIAGNOSIS — E785 Hyperlipidemia, unspecified: Secondary | ICD-10-CM

## 2019-01-21 DIAGNOSIS — E538 Deficiency of other specified B group vitamins: Secondary | ICD-10-CM

## 2019-01-21 LAB — LIPID PANEL
Cholesterol: 135 mg/dL (ref 0–200)
HDL: 43.7 mg/dL (ref >39.00)
LDL Cholesterol: 75 mg/dL (ref 0–99)
NonHDL: 91.67
Total CHOL/HDL Ratio: 3
Triglycerides: 82 mg/dL (ref 0.0–149.0)
VLDL: 16.4 mg/dL (ref 0.0–40.0)

## 2019-01-21 LAB — BASIC METABOLIC PANEL
BUN: 18 mg/dL (ref 6–23)
CO2: 28 mEq/L (ref 19–32)
Calcium: 8.7 mg/dL (ref 8.4–10.5)
Chloride: 106 mEq/L (ref 96–112)
Creatinine, Ser: 1.14 mg/dL (ref 0.40–1.20)
GFR: 47.88 mL/min — ABNORMAL LOW (ref >60.00)
Glucose, Bld: 98 mg/dL (ref 70–99)
Potassium: 4.2 mEq/L (ref 3.5–5.1)
Sodium: 143 mEq/L (ref 135–145)

## 2019-01-21 LAB — VITAMIN B12: Vitamin B-12: 162 pg/mL — ABNORMAL LOW (ref 211–911)

## 2019-01-21 MED ORDER — CYANOCOBALAMIN 1000 MCG/ML IJ SOLN
1000.0000 ug | Freq: Once | INTRAMUSCULAR | Status: AC
  Administered 2019-01-21: 1000 ug via INTRAMUSCULAR

## 2019-01-21 NOTE — Progress Notes (Signed)
Per orders of Vernona Rieger, NP injection of B12 given by Consuella Lose. Patient tolerated injection well.  Labs were checked prior to her injections.

## 2019-01-22 ENCOUNTER — Other Ambulatory Visit: Payer: Self-pay | Admitting: Primary Care

## 2019-01-22 DIAGNOSIS — E538 Deficiency of other specified B group vitamins: Secondary | ICD-10-CM

## 2019-01-22 DIAGNOSIS — N182 Chronic kidney disease, stage 2 (mild): Secondary | ICD-10-CM

## 2019-02-27 ENCOUNTER — Ambulatory Visit (INDEPENDENT_AMBULATORY_CARE_PROVIDER_SITE_OTHER): Payer: BC Managed Care – PPO

## 2019-02-27 DIAGNOSIS — E538 Deficiency of other specified B group vitamins: Secondary | ICD-10-CM | POA: Diagnosis not present

## 2019-02-27 MED ORDER — CYANOCOBALAMIN 1000 MCG/ML IJ SOLN
1000.0000 ug | Freq: Once | INTRAMUSCULAR | Status: AC
  Administered 2019-02-27: 1000 ug via INTRAMUSCULAR

## 2019-02-27 NOTE — Progress Notes (Signed)
Pt given monthly B12 in right deltoid. Pt tolerated well. She will call the office to schedule her next injection.

## 2019-04-15 ENCOUNTER — Other Ambulatory Visit: Payer: Self-pay

## 2019-04-15 ENCOUNTER — Ambulatory Visit (INDEPENDENT_AMBULATORY_CARE_PROVIDER_SITE_OTHER): Payer: BC Managed Care – PPO

## 2019-04-15 DIAGNOSIS — E538 Deficiency of other specified B group vitamins: Secondary | ICD-10-CM

## 2019-04-15 MED ORDER — CYANOCOBALAMIN 1000 MCG/ML IJ SOLN
1000.0000 ug | Freq: Once | INTRAMUSCULAR | Status: AC
  Administered 2019-04-15: 09:00:00 1000 ug via INTRAMUSCULAR

## 2019-04-15 NOTE — Progress Notes (Signed)
Per orders of Alma Friendly, NP, injection of Vitamin B12 given by Diamond Nickel, RN.  Administered to left deltoid IM.   Note: patient is due for B12 level prior to administering next injection in August.  She was reminded at today's visit.   Patient tolerated injection well.

## 2019-04-18 ENCOUNTER — Other Ambulatory Visit: Payer: Self-pay | Admitting: Primary Care

## 2019-04-18 DIAGNOSIS — I10 Essential (primary) hypertension: Secondary | ICD-10-CM

## 2019-05-20 ENCOUNTER — Other Ambulatory Visit (INDEPENDENT_AMBULATORY_CARE_PROVIDER_SITE_OTHER): Payer: Medicare Other

## 2019-05-20 ENCOUNTER — Ambulatory Visit (INDEPENDENT_AMBULATORY_CARE_PROVIDER_SITE_OTHER): Payer: Medicare Other

## 2019-05-20 DIAGNOSIS — E538 Deficiency of other specified B group vitamins: Secondary | ICD-10-CM | POA: Diagnosis not present

## 2019-05-20 DIAGNOSIS — N182 Chronic kidney disease, stage 2 (mild): Secondary | ICD-10-CM

## 2019-05-20 LAB — BASIC METABOLIC PANEL
BUN: 19 mg/dL (ref 6–23)
CO2: 30 mEq/L (ref 19–32)
Calcium: 9.3 mg/dL (ref 8.4–10.5)
Chloride: 106 mEq/L (ref 96–112)
Creatinine, Ser: 1.24 mg/dL — ABNORMAL HIGH (ref 0.40–1.20)
GFR: 43.41 mL/min — ABNORMAL LOW (ref >60.00)
Glucose, Bld: 95 mg/dL (ref 70–99)
Potassium: 4.5 mEq/L (ref 3.5–5.1)
Sodium: 143 mEq/L (ref 135–145)

## 2019-05-20 LAB — VITAMIN B12: Vitamin B-12: 222 pg/mL (ref 211–911)

## 2019-05-20 MED ORDER — CYANOCOBALAMIN 1000 MCG/ML IJ SOLN
1000.0000 ug | Freq: Once | INTRAMUSCULAR | Status: AC
  Administered 2019-05-20: 14:00:00 1000 ug via INTRAMUSCULAR

## 2019-05-20 NOTE — Progress Notes (Signed)
Pt had B12 labs drawn prior to injection. B12 injection given in Right Deltoid. Pt tolerated well. She stated she will wait for the results before scheduling another nurse visit.

## 2019-05-27 ENCOUNTER — Telehealth: Payer: Self-pay | Admitting: Primary Care

## 2019-05-27 DIAGNOSIS — I10 Essential (primary) hypertension: Secondary | ICD-10-CM

## 2019-05-27 DIAGNOSIS — E785 Hyperlipidemia, unspecified: Secondary | ICD-10-CM

## 2019-05-27 NOTE — Telephone Encounter (Signed)
Noted. Refill has been sent earlier today for patient through MyChart message.

## 2019-05-27 NOTE — Telephone Encounter (Signed)
[place], called.  Patient uses a mail order pharmacy and wants to switch to [facility] and the mail order is being difficult. Patient needs Losartan 50 mg and Simvastatin 40 mg.refilled.

## 2019-05-29 MED ORDER — SIMVASTATIN 40 MG PO TABS: 40.0000 mg | ORAL_TABLET | Freq: Every day | ORAL | 1 refills | Status: DC

## 2019-05-29 MED ORDER — LOSARTAN POTASSIUM 50 MG PO TABS: 50.0000 mg | ORAL_TABLET | Freq: Every day | ORAL | 1 refills | Status: DC

## 2019-08-26 ENCOUNTER — Other Ambulatory Visit: Payer: Self-pay | Admitting: Primary Care

## 2019-08-26 DIAGNOSIS — E785 Hyperlipidemia, unspecified: Secondary | ICD-10-CM

## 2019-10-06 ENCOUNTER — Other Ambulatory Visit: Payer: Self-pay | Admitting: Primary Care

## 2019-10-06 DIAGNOSIS — E785 Hyperlipidemia, unspecified: Secondary | ICD-10-CM

## 2019-10-06 DIAGNOSIS — Z1159 Encounter for screening for other viral diseases: Secondary | ICD-10-CM

## 2019-10-06 DIAGNOSIS — I10 Essential (primary) hypertension: Secondary | ICD-10-CM

## 2019-10-06 DIAGNOSIS — E538 Deficiency of other specified B group vitamins: Secondary | ICD-10-CM

## 2019-10-09 ENCOUNTER — Telehealth: Payer: Self-pay

## 2019-10-09 NOTE — Telephone Encounter (Signed)
LVM w COVID screen, front door and back lab info 1.21.2021 TLJ  

## 2019-10-13 ENCOUNTER — Other Ambulatory Visit (INDEPENDENT_AMBULATORY_CARE_PROVIDER_SITE_OTHER): Payer: Medicare PPO

## 2019-10-13 ENCOUNTER — Other Ambulatory Visit: Payer: Self-pay

## 2019-10-13 DIAGNOSIS — I10 Essential (primary) hypertension: Secondary | ICD-10-CM

## 2019-10-13 DIAGNOSIS — E538 Deficiency of other specified B group vitamins: Secondary | ICD-10-CM

## 2019-10-13 DIAGNOSIS — Z1159 Encounter for screening for other viral diseases: Secondary | ICD-10-CM

## 2019-10-13 DIAGNOSIS — E785 Hyperlipidemia, unspecified: Secondary | ICD-10-CM

## 2019-10-13 LAB — COMPREHENSIVE METABOLIC PANEL
ALT: 13 U/L (ref 0–35)
AST: 13 U/L (ref 0–37)
Albumin: 4.1 g/dL (ref 3.5–5.2)
Alkaline Phosphatase: 105 U/L (ref 39–117)
BUN: 13 mg/dL (ref 6–23)
CO2: 30 mEq/L (ref 19–32)
Calcium: 9.2 mg/dL (ref 8.4–10.5)
Chloride: 105 mEq/L (ref 96–112)
Creatinine, Ser: 1.1 mg/dL (ref 0.40–1.20)
GFR: 49.79 mL/min — ABNORMAL LOW (ref >60.00)
Glucose, Bld: 96 mg/dL (ref 70–99)
Potassium: 5.1 mEq/L (ref 3.5–5.1)
Sodium: 140 mEq/L (ref 135–145)
Total Bilirubin: 1.2 mg/dL (ref 0.2–1.2)
Total Protein: 6.6 g/dL (ref 6.0–8.3)

## 2019-10-13 LAB — LIPID PANEL
Cholesterol: 153 mg/dL (ref 0–200)
HDL: 47.3 mg/dL (ref >39.00)
LDL Cholesterol: 91 mg/dL (ref 0–99)
NonHDL: 106.16
Total CHOL/HDL Ratio: 3
Triglycerides: 77 mg/dL (ref 0.0–149.0)
VLDL: 15.4 mg/dL (ref 0.0–40.0)

## 2019-10-13 LAB — CBC
HCT: 37.9 % (ref 36.0–46.0)
Hemoglobin: 12.6 g/dL (ref 12.0–15.0)
MCHC: 33.3 g/dL (ref 30.0–36.0)
MCV: 89.5 fl (ref 78.0–100.0)
Platelets: 224 10*3/uL (ref 150.0–400.0)
RBC: 4.23 Mil/uL (ref 3.87–5.11)
RDW: 13.6 % (ref 11.5–15.5)
WBC: 5.7 10*3/uL (ref 4.0–10.5)

## 2019-10-13 LAB — VITAMIN B12: Vitamin B-12: 191 pg/mL — ABNORMAL LOW (ref 211–911)

## 2019-10-14 LAB — HEPATITIS C ANTIBODY
Hepatitis C Ab: NONREACTIVE
SIGNAL TO CUT-OFF: 0.01 (ref <1.00)

## 2019-10-17 ENCOUNTER — Ambulatory Visit (INDEPENDENT_AMBULATORY_CARE_PROVIDER_SITE_OTHER): Payer: Medicare PPO | Admitting: Primary Care

## 2019-10-17 ENCOUNTER — Other Ambulatory Visit: Payer: Self-pay

## 2019-10-17 ENCOUNTER — Encounter: Payer: Self-pay | Admitting: Primary Care

## 2019-10-17 VITALS — BP 124/84 | HR 75 | Temp 96.1°F | Ht 65.25 in | Wt 159.5 lb

## 2019-10-17 DIAGNOSIS — Z0000 Encounter for general adult medical examination without abnormal findings: Secondary | ICD-10-CM

## 2019-10-17 DIAGNOSIS — E2839 Other primary ovarian failure: Secondary | ICD-10-CM

## 2019-10-17 DIAGNOSIS — E785 Hyperlipidemia, unspecified: Secondary | ICD-10-CM

## 2019-10-17 DIAGNOSIS — Z1231 Encounter for screening mammogram for malignant neoplasm of breast: Secondary | ICD-10-CM | POA: Diagnosis not present

## 2019-10-17 DIAGNOSIS — I10 Essential (primary) hypertension: Secondary | ICD-10-CM

## 2019-10-17 DIAGNOSIS — N182 Chronic kidney disease, stage 2 (mild): Secondary | ICD-10-CM

## 2019-10-17 DIAGNOSIS — R160 Hepatomegaly, not elsewhere classified: Secondary | ICD-10-CM

## 2019-10-17 DIAGNOSIS — F3342 Major depressive disorder, recurrent, in full remission: Secondary | ICD-10-CM

## 2019-10-17 DIAGNOSIS — E538 Deficiency of other specified B group vitamins: Secondary | ICD-10-CM

## 2019-10-17 DIAGNOSIS — H3530 Unspecified macular degeneration: Secondary | ICD-10-CM

## 2019-10-17 HISTORY — DX: Encounter for general adult medical examination without abnormal findings: Z00.00

## 2019-10-17 NOTE — Assessment & Plan Note (Signed)
Following with ophthalmology. 

## 2019-10-17 NOTE — Patient Instructions (Signed)
Start exercising. You should be getting 150 minutes of moderate intensity exercise weekly.  Continue to work on a healthy diet. Ensure you are consuming 64 ounces of water daily.  Schedule a nurse visit for your pneumonia vaccination one month after your second Covid-19 vaccine.  Please notify me if you are interested in the Shingles vaccines.  Schedule a lab only appointment for 3 months for B12 check.  It was a pleasure to see you today!

## 2019-10-17 NOTE — Assessment & Plan Note (Signed)
LDL at goal, continue simvastatin.  

## 2019-10-17 NOTE — Assessment & Plan Note (Signed)
No suspicious lesions from MRI in 2017.

## 2019-10-17 NOTE — Assessment & Plan Note (Signed)
Doing well overall on duloxetine. Continue same.

## 2019-10-17 NOTE — Progress Notes (Signed)
Patient ID: Heather [name], female   DOB: 1955/11/11, 64 y.o.   MRN: [id number]  Heather [name] is a 64 year old female who presents today for Welcome to Medicare Visit.  HPI:  Past Medical History:  Diagnosis Date  . B12 deficiency   . Chronic kidney disease   . Depression   . GERD (gastroesophageal reflux disease)   . Hyperlipidemia   . Hypertension    had ankle swelling with amlodipine  . Infection of urinary tract 12/16/2014  . RLS (restless legs syndrome)     Current Outpatient Medications  Medication Sig Dispense Refill  . acetaminophen (TYLENOL) 325 MG tablet Take 650 mg by mouth every 6 (six) hours as needed.      Marland Kitchen atenolol (TENORMIN) 25 MG tablet TAKE 1 TABLET DAILY 90 tablet 1  . cyanocobalamin (,VITAMIN B-12,) 1000 MCG/ML injection Inject 1,000 mcg into the muscle every 30 (thirty) days. Started 01/2019, due for recheck B12 in August 2020    . DULoxetine (CYMBALTA) 30 MG capsule Take 1 capsule (30 mg total) by mouth daily. 90 capsule 3  . losartan (COZAAR) 50 MG tablet Take 1 tablet (50 mg total) by mouth daily. For blood pressure. 90 tablet 1  . omeprazole (PRILOSEC) 20 MG capsule Take 1 capsule (20 mg total) by mouth 2 (two) times daily. 180 capsule 3  . simvastatin (ZOCOR) 40 MG tablet TAKE 1 TABLET BY MOUTH AT BEDTIME FOR CHOLESTEROL 90 tablet 1   No current facility-administered medications for this visit.    Allergies  Allergen Reactions  . Augmentin [Amoxicillin-Pot Clavulanate] Diarrhea    Family History  Problem Relation Age of Onset  . Hypertension Mother   . Heart attack Mother 13       sudden cardiac death  . COPD Father   . Heart attack Father 4       developed ischemic cardiomyopathy    Social History   Socioeconomic History  . Marital status: Married    Spouse name: Not on file  . Number of children: Not on file  . Years of education: Not on file  . Highest education level: Not on file  Occupational History  . Occupation: retired Educational psychologist: Retired  . Occupation: HR (part-time)    Employer: [employer]  Tobacco Use  . Smoking status: Never Smoker  . Smokeless tobacco: Never Used  Substance and Sexual Activity  . Alcohol use: No  . Drug use: No  . Sexual activity: Not on file  Other Topics Concern  . Not on file  Social History Narrative   Works part-time in [employer] for [employer] in [employer] in [place]      Married   Social Determinants of Health   Financial Resource Strain:   . Difficulty of Paying Living Expenses: Not on file  Food Insecurity:   . Worried About Programme researcher, broadcasting/film/video in the Last Year: Not on file  . Ran Out of Food in the Last Year: Not on file  Transportation Needs:   . Lack of Transportation (Medical): Not on file  . Lack of Transportation (Non-Medical): Not on file  Physical Activity:   . Days of Exercise per Week: Not on file  . Minutes of Exercise per Session: Not on file  Stress:   . Feeling of Stress : Not on file  Social Connections:   . Frequency of Communication with Friends and Family: Not on file  . Frequency  of Social Gatherings with Friends and Family: Not on file  . Attends Religious Services: Not on file  . Active Member of Clubs or Organizations: Not on file  . Attends Archivist Meetings: Not on file  . Marital Status: Not on file  Intimate Partner Violence:   . Fear of Current or Ex-Partner: Not on file  . Emotionally Abused: Not on file  . Physically Abused: Not on file  . Sexually Abused: Not on file    Hospitiliaztions: None  Health Maintenance:    Flu: Declines  Tetanus: Completed in 2020  Pneumovax: Never completed   Prevnar: Never completed   Shingrix: Declines   Bone Density: Due  Colonoscopy: Completed in 2020, due in March 2021  Eye Doctor: Completed in 2020  Dental Exam: Completed in 2020  Mammogram: Due  Hep C Screening: Negative in 221  Covid-19 Vaccine: Part 1 completed     Providers: Alma Friendly, PCP; Dr. Earlean Shawl, GI   I have personally reviewed and have noted: 1. The patient's medical and social history 2. Their use of alcohol, tobacco or illicit drugs 3. Their current medications and supplements 4. The patient's functional ability including ADL's, fall risks, home  safety risks and hearing or visual impairment. 5. Diet and physical activities 6. Evidence for depression or mood disorder  Subjective:   Review of Systems:   Constitutional: Denies fever, malaise, fatigue, headache or abrupt weight changes.  HEENT: Denies eye pain, eye redness, ear pain, ringing in the ears, wax buildup, runny nose, nasal congestion, bloody nose, or sore throat. Respiratory: Denies difficulty breathing, shortness of breath, cough or sputum production.   Cardiovascular: Denies chest pain, chest tightness, palpitations or swelling in the hands or feet.  Gastrointestinal: Denies abdominal pain, bloating, constipation, diarrhea or blood in the stool.  GU: Denies urgency, frequency, pain with urination, burning sensation, blood in urine, odor or discharge. Musculoskeletal: Denies decrease in range of motion, difficulty with gait, muscle pain or joint pain and swelling.  Skin: Denies redness, rashes, lesions or ulcercations.  Neurological: Denies dizziness, difficulty with memory, difficulty with speech or problems with balance and coordination.  Psychiatric: Denies concerns for anxiety or depression.   No other specific complaints in a complete review of systems (except as listed in HPI above).  Objective:  PE:   BP 124/84   Pulse 75   Temp (!) 96.1 F (35.6 C) (Temporal)   Ht 5' 5.25" (1.657 m)   Wt 159 lb 8 oz (72.3 kg)   SpO2 98%   BMI 26.34 kg/m  Wt Readings from Last 3 Encounters:  10/17/19 159 lb 8 oz (72.3 kg)  10/11/18 157 lb 8 oz (71.4 kg)  12/17/17 158 lb 8 oz (71.9 kg)    General: Appears their stated age, well developed, well nourished in NAD. Skin: Warm, dry and  intact. No rashes, lesions or ulcerations noted. HEENT: Head: normal shape and size; Eyes: sclera white, no icterus, conjunctiva pink, PERRLA and EOMs intact; Ears: Tm's gray and intact, normal light reflex; Nose: mucosa pink and moist, septum midline; Throat/Mouth: Teeth present, mucosa pink and moist, no exudate, lesions or ulcerations noted.  Neck: Normal range of motion. Neck supple, trachea midline. No massses, lumps or thyromegaly present.  Cardiovascular: Normal rate and rhythm. S1,S2 noted.  No murmur, rubs or gallops noted. No JVD or BLE edema. No carotid bruits noted. Pulmonary/Chest: Normal effort and positive vesicular breath sounds. No respiratory distress. No wheezes, rales or ronchi noted.  Abdomen: Soft and nontender. Normal bowel sounds, no bruits noted. No distention or masses noted. Liver, spleen and kidneys non palpable. Musculoskeletal: Normal range of motion. No signs of joint swelling. No difficulty with gait.  Neurological: Alert and oriented. Cranial nerves II-XII intact. Coordination normal. +DTRs bilaterally. Psychiatric: Mood and affect normal. Behavior is normal. Judgment and thought content normal.   EKG: NSR with rate of 65. No PAC/PVC. T-wave difference from 2011 in V2.  BMET    Component Value Date/Time   NA 140 10/13/2019 0933   K 5.1 10/13/2019 0933   CL 105 10/13/2019 0933   CO2 30 10/13/2019 0933   GLUCOSE 96 10/13/2019 0933   BUN 13 10/13/2019 0933   CREATININE 1.10 10/13/2019 0933   CALCIUM 9.2 10/13/2019 0933    Lipid Panel     Component Value Date/Time   CHOL 153 10/13/2019 0933   TRIG 77.0 10/13/2019 0933   HDL 47.30 10/13/2019 0933   CHOLHDL 3 10/13/2019 0933   VLDL 15.4 10/13/2019 0933   LDLCALC 91 10/13/2019 0933    CBC    Component Value Date/Time   WBC 5.7 10/13/2019 0933   RBC 4.23 10/13/2019 0933   HGB 12.6 10/13/2019 0933   HCT 37.9 10/13/2019 0933   PLT 224.0 10/13/2019 0933   MCV 89.5 10/13/2019 0933   MCHC 33.3  10/13/2019 0933   RDW 13.6 10/13/2019 0933   LYMPHSABS 1.9 03/17/2014 1147   MONOABS 0.6 03/17/2014 1147   EOSABS 0.1 03/17/2014 1147   BASOSABS 0.0 03/17/2014 1147    Hgb A1C Lab Results  Component Value Date   HGBA1C 5.6 10/11/2018      Assessment and Plan:   Medicare Annual Wellness Visit:  Diet: She endorses a healthy diet. Physical activity: Walks 30 min daily, walks her dogs Depression/mood screen: Negative Hearing: Intact to whispered voice Visual acuity: Grossly normal, performs annual eye exam  ADLs: Capable Fall risk: None Home safety: Good Cognitive evaluation: Intact to orientation, naming, recall and repetition EOL planning: Adv directives, full code/ I agree  Preventative Medicine:  Prevnar due, will get later as she is in the middle of Covid-19 vaccines. Declines Shingrix and influenza vaccinations. Bone density scan and mammogram due, orders placed. Colonoscopy UTD, due in March 2021. Encouraged a health diet and regular exercise. Exam today unremarkable. Labs reviewed. All recommendations provided at end of visit.  Next appointment: One year

## 2019-10-17 NOTE — Assessment & Plan Note (Signed)
Stable in the office today, continue current regimen. Labs reviewed.

## 2019-10-17 NOTE — Assessment & Plan Note (Signed)
Recent renal function stable. Continue to monitor.  Continue losartan for renal protection.

## 2019-10-17 NOTE — Assessment & Plan Note (Signed)
Recent level low. She is off all treatment. Will trial oral B12 at 1000 mcg daily. Repeat labs in 3 months.

## 2019-10-17 NOTE — Assessment & Plan Note (Addendum)
Prevnar due, will get later as she is in the middle of Covid-19 vaccines. Declines Shingrix and influenza vaccinations. Bone density scan and mammogram due, orders placed. Colonoscopy UTD, due in March 2021. Encouraged a health diet and regular exercise. Exam today unremarkable. Labs reviewed. All recommendations provided at end of visit.  I have personally reviewed and have noted: 1. The patient's medical and social history 2. Their use of alcohol, tobacco or illicit drugs 3. Their current medications and supplements 4. The patient's functional ability including ADL's, fall risks, home  safety risks and hearing or visual impairment. 5. Diet and physical activities 6. Evidence for depression or mood disorder

## 2019-12-12 DIAGNOSIS — F3342 Major depressive disorder, recurrent, in full remission: Secondary | ICD-10-CM

## 2019-12-12 DIAGNOSIS — E785 Hyperlipidemia, unspecified: Secondary | ICD-10-CM

## 2019-12-12 DIAGNOSIS — I10 Essential (primary) hypertension: Secondary | ICD-10-CM

## 2019-12-12 MED ORDER — LOSARTAN POTASSIUM 50 MG PO TABS: 50.0000 mg | ORAL_TABLET | Freq: Every day | ORAL | 2 refills | Status: DC

## 2019-12-12 MED ORDER — DULOXETINE HCL 30 MG PO CPEP: 30.0000 mg | ORAL_CAPSULE | Freq: Every day | ORAL | 2 refills | Status: DC

## 2019-12-12 MED ORDER — ATENOLOL 25 MG PO TABS: 25.0000 mg | ORAL_TABLET | Freq: Every day | ORAL | 2 refills | Status: DC

## 2019-12-12 MED ORDER — SIMVASTATIN 40 MG PO TABS: ORAL_TABLET | ORAL | 2 refills | Status: DC

## 2020-01-09 ENCOUNTER — Ambulatory Visit
Admission: RE | Admit: 2020-01-09 | Discharge: 2020-01-09 | Disposition: A | Discharge status: Home / Self Care | Payer: Medicare PPO | Source: Ambulatory Visit | Attending: Primary Care | Admitting: Primary Care

## 2020-01-09 ENCOUNTER — Other Ambulatory Visit: Payer: Self-pay

## 2020-01-09 DIAGNOSIS — Z1231 Encounter for screening mammogram for malignant neoplasm of breast: Secondary | ICD-10-CM

## 2020-01-09 DIAGNOSIS — E2839 Other primary ovarian failure: Secondary | ICD-10-CM

## 2020-02-10 ENCOUNTER — Ambulatory Visit: Payer: Medicare PPO | Admitting: Primary Care

## 2020-02-10 ENCOUNTER — Encounter: Payer: Self-pay | Admitting: Primary Care

## 2020-02-10 ENCOUNTER — Other Ambulatory Visit: Payer: Self-pay

## 2020-02-10 VITALS — BP 126/84 | HR 92 | Temp 96.0°F | Ht 65.25 in | Wt 157.8 lb

## 2020-02-10 DIAGNOSIS — R350 Frequency of micturition: Secondary | ICD-10-CM | POA: Insufficient documentation

## 2020-02-10 LAB — POC URINALSYSI DIPSTICK (AUTOMATED)
Bilirubin, UA: NEGATIVE
Blood, UA: NEGATIVE
Glucose, UA: NEGATIVE
Ketones, UA: NEGATIVE
Leukocytes, UA: NEGATIVE
Nitrite, UA: POSITIVE
Protein, UA: NEGATIVE
Spec Grav, UA: >=1.03 — AB (ref 1.010–1.025)
Urobilinogen, UA: 0.2 E.U./dL
pH, UA: 6 (ref 5.0–8.0)

## 2020-02-10 NOTE — Patient Instructions (Addendum)
We will be in touch with your urine culture Thursday this week.   You may take the AZO for any symptoms.  It was a pleasure to see you today!

## 2020-02-10 NOTE — Assessment & Plan Note (Signed)
Acute for the last week with suprapubic pressure.  UA today with positive nitrites, otherwise negative. Culture sent.  Given results we will wait for urine culture to return, she is fine with this. Start AZO in the mean time, push water intake. She will update.

## 2020-02-10 NOTE — Progress Notes (Signed)
Subjective:    Patient ID: Heather [name], female    DOB: 09/05/1956, 64 y.o.   MRN: [id number]  HPI  This visit occurred during the SARS-CoV-2 public health emergency.  Safety protocols were in place, including screening questions prior to the visit, additional usage of staff PPE, and extensive cleaning of exam room while observing appropriate contact time as indicated for disinfecting solutions.   Heather [name] is a 64 year old female with a history of hypertension, CKD, vaginal atrophy, acute cystitis, hyperglycemia who presents today with a chief complaint of urinary frequency.  She also reports suprapubic pressure, lower back aching. Symptoms began about one week ago. She denies hematuria, vaginal discharge, fevers, abdominal pain.   BP Readings from Last 3 Encounters:  02/10/20 126/84  10/17/19 124/84  10/11/18 122/76     Review of Systems  Constitutional: Negative for fever.  Gastrointestinal: Negative for abdominal pain and nausea.  Genitourinary: Positive for frequency and pelvic pain. Negative for dysuria, hematuria and vaginal discharge.       Past Medical History:  Diagnosis Date  . B12 deficiency   . Chronic kidney disease   . Depression   . GERD (gastroesophageal reflux disease)   . Hyperlipidemia   . Hypertension    had ankle swelling with amlodipine  . Infection of urinary tract 12/16/2014  . RLS (restless legs syndrome)      Social History   Socioeconomic History  . Marital status: Married    Spouse name: Not on file  . Number of children: Not on file  . Years of education: Not on file  . Highest education level: Not on file  Occupational History  . Occupation: retired Ship broker: Retired  . Occupation: HR (part-time)    Employer: [employer]  Tobacco Use  . Smoking status: Never Smoker  . Smokeless tobacco: Never Used  Substance and Sexual Activity  . Alcohol use: No  . Drug use: No  . Sexual activity: Not on file  Other  Topics Concern  . Not on file  Social History Narrative   Works part-time in [employer] for [employer] in [employer] in [place]      Married   Social Determinants of Health   Financial Resource Strain:   . Difficulty of Paying Living Expenses:   Food Insecurity:   . Worried About Charity fundraiser in the Last Year:   . Arboriculturist in the Last Year:   Transportation Needs:   . Film/video editor (Medical):   Marland Kitchen Lack of Transportation (Non-Medical):   Physical Activity:   . Days of Exercise per Week:   . Minutes of Exercise per Session:   Stress:   . Feeling of Stress :   Social Connections:   . Frequency of Communication with Friends and Family:   . Frequency of Social Gatherings with Friends and Family:   . Attends Religious Services:   . Active Member of Clubs or Organizations:   . Attends Archivist Meetings:   Marland Kitchen Marital Status:   Intimate Partner Violence:   . Fear of Current or Ex-Partner:   . Emotionally Abused:   Marland Kitchen Physically Abused:   . Sexually Abused:     Past Surgical History:  Procedure Laterality Date  . SALPINGOOPHORECTOMY     right; laproscopic    Family History  Problem Relation Age of Onset  . Hypertension Mother   . Heart attack Mother  70       sudden cardiac death  . COPD Father   . Heart attack Father 72       developed ischemic cardiomyopathy    Allergies  Allergen Reactions  . Augmentin [Amoxicillin-Pot Clavulanate] Diarrhea    Current Outpatient Medications on File Prior to Visit  Medication Sig Dispense Refill  . acetaminophen (TYLENOL) 325 MG tablet Take 650 mg by mouth every 6 (six) hours as needed.      Marland Kitchen atenolol (TENORMIN) 25 MG tablet Take 1 tablet (25 mg total) by mouth daily. 90 tablet 2  . cyanocobalamin (,VITAMIN B-12,) 1000 MCG/ML injection Inject 1,000 mcg into the muscle every 30 (thirty) days. Started 01/2019, due for recheck B12 in August 2020    . DULoxetine (CYMBALTA) 30 MG capsule Take 1  capsule (30 mg total) by mouth daily. 90 capsule 2  . losartan (COZAAR) 50 MG tablet Take 1 tablet (50 mg total) by mouth daily. For blood pressure. 90 tablet 2  . omeprazole (PRILOSEC) 20 MG capsule Take 1 capsule (20 mg total) by mouth 2 (two) times daily. 180 capsule 3  . simvastatin (ZOCOR) 40 MG tablet TAKE 1 TABLET BY MOUTH AT BEDTIME FOR CHOLESTEROL 90 tablet 2   No current facility-administered medications on file prior to visit.    BP 126/84   Pulse 92   Temp (!) 96 F (35.6 C) (Temporal)   Ht 5' 5.25" (1.657 m)   Wt 157 lb 12 oz (71.6 kg)   SpO2 97%   BMI 26.05 kg/m    Objective:   Physical Exam  Constitutional: She appears well-nourished.  Respiratory: Effort normal.  GI: Soft. Bowel sounds are normal. There is abdominal tenderness in the suprapubic area. There is no CVA tenderness.             Assessment & Plan:

## 2020-02-12 ENCOUNTER — Other Ambulatory Visit: Payer: Self-pay | Admitting: Primary Care

## 2020-02-12 DIAGNOSIS — N3000 Acute cystitis without hematuria: Secondary | ICD-10-CM

## 2020-02-12 LAB — URINE CULTURE
MICRO NUMBER:: 10517314
SPECIMEN QUALITY:: ADEQUATE

## 2020-02-12 MED ORDER — NITROFURANTOIN MONOHYD MACRO 100 MG PO CAPS: 100.0000 mg | ORAL_CAPSULE | Freq: Two times a day (BID) | ORAL | 0 refills | Status: AC

## 2020-07-31 ENCOUNTER — Ambulatory Visit: Payer: Medicare PPO | Attending: Internal Medicine

## 2020-07-31 ENCOUNTER — Other Ambulatory Visit: Payer: Self-pay

## 2020-07-31 DIAGNOSIS — Z23 Encounter for immunization: Secondary | ICD-10-CM

## 2020-07-31 NOTE — Progress Notes (Signed)
   Covid-19 Vaccination Clinic  Name:  Heather [name]    MRN: [id number] DOB: November 03, 1955  07/31/2020  [name] was observed post Covid-19 immunization for 15 minutes without incident. She was provided with Vaccine Information Sheet and instruction to access the V-Safe system.   [name] was instructed to call 911 with any severe reactions post vaccine: Marland Kitchen Difficulty breathing  . Swelling of face and throat  . A fast heartbeat  . A bad rash all over body  . Dizziness and weakness   Immunizations Administered    Name Date Dose VIS Date Route   Pfizer COVID-19 Vaccine 07/31/2020 11:16 AM 0.3 mL 07/07/2020 Intramuscular   Manufacturer: [employer], [employer]   Lot: [id number]   NDC: [address]-7

## 2020-08-19 DIAGNOSIS — H2512 Age-related nuclear cataract, left eye: Secondary | ICD-10-CM | POA: Diagnosis not present

## 2020-08-26 ENCOUNTER — Other Ambulatory Visit: Payer: Self-pay

## 2020-08-26 DIAGNOSIS — H2512 Age-related nuclear cataract, left eye: Secondary | ICD-10-CM | POA: Diagnosis not present

## 2020-08-26 DIAGNOSIS — E785 Hyperlipidemia, unspecified: Secondary | ICD-10-CM

## 2020-08-26 DIAGNOSIS — H52223 Regular astigmatism, bilateral: Secondary | ICD-10-CM | POA: Diagnosis not present

## 2020-08-26 DIAGNOSIS — H524 Presbyopia: Secondary | ICD-10-CM | POA: Diagnosis not present

## 2020-08-26 DIAGNOSIS — Z961 Presence of intraocular lens: Secondary | ICD-10-CM | POA: Diagnosis not present

## 2020-08-26 DIAGNOSIS — I10 Essential (primary) hypertension: Secondary | ICD-10-CM

## 2020-08-26 MED ORDER — SIMVASTATIN 40 MG PO TABS: ORAL_TABLET | ORAL | 1 refills | Status: DC

## 2020-08-26 MED ORDER — LOSARTAN POTASSIUM 50 MG PO TABS: 50.0000 mg | ORAL_TABLET | Freq: Every day | ORAL | 1 refills | Status: DC

## 2020-09-21 DIAGNOSIS — N8189 Other female genital prolapse: Secondary | ICD-10-CM | POA: Diagnosis not present

## 2020-09-21 DIAGNOSIS — R152 Fecal urgency: Secondary | ICD-10-CM | POA: Diagnosis not present

## 2020-09-21 DIAGNOSIS — R159 Full incontinence of feces: Secondary | ICD-10-CM | POA: Diagnosis not present

## 2020-09-21 DIAGNOSIS — N393 Stress incontinence (female) (male): Secondary | ICD-10-CM | POA: Diagnosis not present

## 2020-09-23 DIAGNOSIS — H52223 Regular astigmatism, bilateral: Secondary | ICD-10-CM | POA: Diagnosis not present

## 2020-09-23 DIAGNOSIS — Z961 Presence of intraocular lens: Secondary | ICD-10-CM | POA: Diagnosis not present

## 2020-09-23 DIAGNOSIS — H524 Presbyopia: Secondary | ICD-10-CM | POA: Diagnosis not present

## 2020-09-27 DIAGNOSIS — N393 Stress incontinence (female) (male): Secondary | ICD-10-CM | POA: Diagnosis not present

## 2020-09-27 DIAGNOSIS — N8189 Other female genital prolapse: Secondary | ICD-10-CM | POA: Diagnosis not present

## 2020-09-27 DIAGNOSIS — R159 Full incontinence of feces: Secondary | ICD-10-CM | POA: Diagnosis not present

## 2020-09-27 DIAGNOSIS — R152 Fecal urgency: Secondary | ICD-10-CM | POA: Diagnosis not present

## 2020-10-06 DIAGNOSIS — N393 Stress incontinence (female) (male): Secondary | ICD-10-CM | POA: Diagnosis not present

## 2020-10-06 DIAGNOSIS — R152 Fecal urgency: Secondary | ICD-10-CM | POA: Diagnosis not present

## 2020-10-06 DIAGNOSIS — N8189 Other female genital prolapse: Secondary | ICD-10-CM | POA: Diagnosis not present

## 2020-10-06 DIAGNOSIS — R159 Full incontinence of feces: Secondary | ICD-10-CM | POA: Diagnosis not present

## 2020-10-11 DIAGNOSIS — R152 Fecal urgency: Secondary | ICD-10-CM | POA: Diagnosis not present

## 2020-10-11 DIAGNOSIS — R159 Full incontinence of feces: Secondary | ICD-10-CM | POA: Diagnosis not present

## 2020-10-11 DIAGNOSIS — N393 Stress incontinence (female) (male): Secondary | ICD-10-CM | POA: Diagnosis not present

## 2020-10-11 DIAGNOSIS — N8189 Other female genital prolapse: Secondary | ICD-10-CM | POA: Diagnosis not present

## 2020-10-18 DIAGNOSIS — R152 Fecal urgency: Secondary | ICD-10-CM | POA: Diagnosis not present

## 2020-10-18 DIAGNOSIS — N393 Stress incontinence (female) (male): Secondary | ICD-10-CM | POA: Diagnosis not present

## 2020-10-18 DIAGNOSIS — R159 Full incontinence of feces: Secondary | ICD-10-CM | POA: Diagnosis not present

## 2020-10-18 DIAGNOSIS — N8189 Other female genital prolapse: Secondary | ICD-10-CM | POA: Diagnosis not present

## 2020-11-02 DIAGNOSIS — R159 Full incontinence of feces: Secondary | ICD-10-CM | POA: Diagnosis not present

## 2020-11-02 DIAGNOSIS — R152 Fecal urgency: Secondary | ICD-10-CM | POA: Diagnosis not present

## 2020-11-02 DIAGNOSIS — N393 Stress incontinence (female) (male): Secondary | ICD-10-CM | POA: Diagnosis not present

## 2020-11-02 DIAGNOSIS — N8189 Other female genital prolapse: Secondary | ICD-10-CM | POA: Diagnosis not present

## 2020-11-16 DIAGNOSIS — R152 Fecal urgency: Secondary | ICD-10-CM | POA: Diagnosis not present

## 2020-11-16 DIAGNOSIS — N8189 Other female genital prolapse: Secondary | ICD-10-CM | POA: Diagnosis not present

## 2020-11-16 DIAGNOSIS — R159 Full incontinence of feces: Secondary | ICD-10-CM | POA: Diagnosis not present

## 2020-11-16 DIAGNOSIS — N393 Stress incontinence (female) (male): Secondary | ICD-10-CM | POA: Diagnosis not present

## 2020-11-17 ENCOUNTER — Other Ambulatory Visit: Payer: Self-pay | Admitting: Primary Care

## 2020-11-17 DIAGNOSIS — F3342 Major depressive disorder, recurrent, in full remission: Secondary | ICD-10-CM

## 2020-11-17 DIAGNOSIS — I10 Essential (primary) hypertension: Secondary | ICD-10-CM

## 2020-11-17 NOTE — Telephone Encounter (Signed)
Called patient and scheduled AWV 4/20.

## 2020-11-17 NOTE — Telephone Encounter (Signed)
Patient is overdue for follow-up/physical with me.  I see where she has an appointment scheduled with the Medicare wellness nurse, but she needs to come in to have labs and evaluation with me.  Please schedule.

## 2020-12-01 DIAGNOSIS — N8189 Other female genital prolapse: Secondary | ICD-10-CM | POA: Diagnosis not present

## 2020-12-01 DIAGNOSIS — N393 Stress incontinence (female) (male): Secondary | ICD-10-CM | POA: Diagnosis not present

## 2020-12-01 DIAGNOSIS — R152 Fecal urgency: Secondary | ICD-10-CM | POA: Diagnosis not present

## 2020-12-01 DIAGNOSIS — R159 Full incontinence of feces: Secondary | ICD-10-CM | POA: Diagnosis not present

## 2020-12-15 DIAGNOSIS — R152 Fecal urgency: Secondary | ICD-10-CM | POA: Diagnosis not present

## 2020-12-15 DIAGNOSIS — N393 Stress incontinence (female) (male): Secondary | ICD-10-CM | POA: Diagnosis not present

## 2020-12-15 DIAGNOSIS — N8189 Other female genital prolapse: Secondary | ICD-10-CM | POA: Diagnosis not present

## 2020-12-15 DIAGNOSIS — R159 Full incontinence of feces: Secondary | ICD-10-CM | POA: Diagnosis not present

## 2021-01-04 ENCOUNTER — Ambulatory Visit (INDEPENDENT_AMBULATORY_CARE_PROVIDER_SITE_OTHER): Payer: Medicare PPO

## 2021-01-04 ENCOUNTER — Other Ambulatory Visit: Payer: Self-pay

## 2021-01-04 DIAGNOSIS — Z0000 Encounter for general adult medical examination without abnormal findings: Secondary | ICD-10-CM | POA: Diagnosis not present

## 2021-01-04 NOTE — Progress Notes (Signed)
Subjective:   Heather [name] is a 65 y.o. female who presents for an Initial Medicare Annual Wellness Visit.  Review of Systems: N/A      I connected with the patient today by telephone and verified that I am speaking with the correct person using two identifiers. Location patient: home Location nurse: work Persons participating in the telephone visit: patient, nurse.   I discussed the limitations, risks, security and privacy concerns of performing an evaluation and management service by telephone and the availability of in person appointments. I also discussed with the patient that there may be a patient responsible charge related to this service. The patient expressed understanding and verbally consented to this telephonic visit.        Cardiac Risk Factors include: advanced age (>52men, >41 women);hypertension;Other (see comment), Risk factor comments: hyperlipidemia     Objective:    Today's Vitals   There is no height or weight on file to calculate BMI.  Advanced Directives 01/04/2021  Does Patient Have a Medical Advance Directive? No  Would patient like information on creating a medical advance directive? No - Patient declined    Current Medications (verified) Outpatient Encounter Medications as of 01/04/2021  Medication Sig  . acetaminophen (TYLENOL) 325 MG tablet Take 650 mg by mouth every 6 (six) hours as needed.  Marland Kitchen atenolol (TENORMIN) 25 MG tablet Take 1 tablet by mouth once daily  . cyanocobalamin (,VITAMIN B-12,) 1000 MCG/ML injection Inject 1,000 mcg into the muscle every 30 (thirty) days. Started 01/2019, due for recheck B12 in August 2020  . DULoxetine (CYMBALTA) 30 MG capsule Take 1 capsule by mouth once daily  . losartan (COZAAR) 50 MG tablet Take 1 tablet (50 mg total) by mouth daily. For blood pressure.  Marland Kitchen omeprazole (PRILOSEC) 20 MG capsule Take 1 capsule (20 mg total) by mouth 2 (two) times daily.  . simvastatin (ZOCOR) 40 MG tablet TAKE 1 TABLET BY MOUTH  AT BEDTIME FOR CHOLESTEROL   No facility-administered encounter medications on file as of 01/04/2021.    Allergies (verified) Augmentin [amoxicillin-pot clavulanate]   History: Past Medical History:  Diagnosis Date  . B12 deficiency   . Chronic kidney disease   . Depression   . GERD (gastroesophageal reflux disease)   . Hyperlipidemia   . Hypertension    had ankle swelling with amlodipine  . Infection of urinary tract 12/16/2014  . RLS (restless legs syndrome)    Past Surgical History:  Procedure Laterality Date  . CATARACT EXTRACTION, BILATERAL  2021  . SALPINGOOPHORECTOMY     right; laproscopic   Family History  Problem Relation Age of Onset  . Hypertension Mother   . Heart attack Mother 76       sudden cardiac death  . COPD Father   . Heart attack Father 24       developed ischemic cardiomyopathy   Social History   Socioeconomic History  . Marital status: Married    Spouse name: Not on file  . Number of children: Not on file  . Years of education: Not on file  . Highest education level: Not on file  Occupational History  . Occupation: retired Ambulance person: Retired  . Occupation: HR (part-time)    Employer: [employer]  Tobacco Use  . Smoking status: Never Smoker  . Smokeless tobacco: Never Used  Substance and Sexual Activity  . Alcohol use: No  . Drug use: No  . Sexual activity: Not on file  Other  Topics Concern  . Not on file  Social History Narrative   Works part-time in [employer] for [employer] in [employer] in [place]      Married   Social Determinants of Health   Financial Resource Strain: Low Risk   . Difficulty of Paying Living Expenses: Not hard at all  Food Insecurity: No Food Insecurity  . Worried About Programme researcher, broadcasting/film/video in the Last Year: Never true  . Ran Out of Food in the Last Year: Never true  Transportation Needs: No Transportation Needs  . Lack of Transportation (Medical): No  . Lack of  Transportation (Non-Medical): No  Physical Activity: Sufficiently Active  . Days of Exercise per Week: 4 days  . Minutes of Exercise per Session: 60 min  Stress: No Stress Concern Present  . Feeling of Stress : Not at all  Social Connections: Not on file    Tobacco Counseling Counseling given: Not Answered   Clinical Intake:  Pre-visit preparation completed: Yes  Pain : No/denies pain     Nutritional Risks: None Diabetes: No  How often do you need to have someone help you when you read instructions, pamphlets, or other written materials from your doctor or pharmacy?: 1 - Never What is the last grade level you completed in school?: masters  Diabetic: No Nutrition Risk Assessment:  Has the patient had any N/V/D within the last 2 months?  No  Does the patient have any non-healing wounds?  No  Has the patient had any unintentional weight loss or weight gain?  No   Diabetes:  Is the patient diabetic?  No  If diabetic, was a CBG obtained today?  N/A Did the patient bring in their glucometer from home?  N/A How often do you monitor your CBG's? N/A.   Financial Strains and Diabetes Management:  Are you having any financial strains with the device, your supplies or your medication? N/A.  Does the patient want to be seen by Chronic Care Management for management of their diabetes?  N/A Would the patient like to be referred to a Nutritionist or for Diabetic Management?  N/A  Interpreter Needed?: No  Information entered by :: CJohnson, LPN   Activities of Daily Living In your present state of health, do you have any difficulty performing the following activities: 01/04/2021  Hearing? N  Vision? N  Difficulty concentrating or making decisions? N  Walking or climbing stairs? N  Dressing or bathing? N  Doing errands, shopping? N  Preparing Food and eating ? N  Using the Toilet? N  In the past six months, have you accidently leaked urine? N  Do you have problems with  loss of bowel control? N  Managing your Medications? N  Managing your Finances? N  Housekeeping or managing your Housekeeping? N  Some recent data might be hidden    Patient Care Team: Doreene Nest, NP as PCP - General (Internal Medicine)  Indicate any recent Medical Services you may have received from other than [facility] providers in the past year (date may be approximate).     Assessment:   This is a routine wellness examination for Yoder.  Hearing/Vision screen  Hearing Screening   125Hz  250Hz  500Hz  1000Hz  2000Hz  3000Hz  4000Hz  6000Hz  8000Hz   Right ear:           Left ear:           Vision Screening Comments: Patient gets annual eye exams.  Dietary issues and exercise  activities discussed: Current Exercise Habits: Home exercise routine, Type of exercise: walking, Time (Minutes): 60, Frequency (Times/Week): 4, Weekly Exercise (Minutes/Week): 240, Intensity: Moderate, Exercise limited by: None identified  Goals    . Patient Stated     01/04/2021, I will continue to walk 4 days a week for about 2 miles.       Depression Screen PHQ 2/9 Scores 01/04/2021 10/17/2019  PHQ - 2 Score 0 0  PHQ- 9 Score 0 -    Fall Risk Fall Risk  01/04/2021 10/17/2019  Falls in the past year? 0 0  Number falls in past yr: 0 0  Injury with Fall? 0 0  Risk for fall due to : Medication side effect -  Follow up Falls evaluation completed;Falls prevention discussed -    FALL RISK PREVENTION PERTAINING TO THE HOME:  Any stairs in or around the home? Yes  If so, are there any without handrails? No  Home free of loose throw rugs in walkways, pet beds, electrical cords, etc? Yes  Adequate lighting in your home to reduce risk of falls? Yes   ASSISTIVE DEVICES UTILIZED TO PREVENT FALLS:  Life alert? No  Use of a cane, walker or w/c? No  Grab bars in the bathroom? No  Shower chair or bench in shower? No  Elevated toilet seat or a handicapped toilet? No   TIMED UP AND GO:  Was the test  performed? N/A virtual/telephone visit.   Cognitive Function: MMSE - Mini Mental State Exam 01/04/2021  Orientation to time 5  Orientation to Place 5  Registration 3  Attention/ Calculation 5  Recall 3  Language- repeat 1       Mini Cog  Mini-Cog screen was completed. Maximum score is 22. A value of 0 denotes this part of the MMSE was not completed or the patient failed this part of the Mini-Cog screening.  Immunizations Immunization History  Administered Date(s) Administered  . PFIZER(Purple Top)SARS-COV-2 Vaccination 10/07/2019, 07/31/2020  . Tdap 10/11/2018    TDAP status: Up to date  Flu Vaccine status: due Fall 2022  Pneumococcal vaccine status: Due, Education has been provided regarding the importance of this vaccine. Advised may receive this vaccine at local pharmacy or Health Dept. Aware to provide a copy of the vaccination record if obtained from local pharmacy or Health Dept. Verbalized acceptance and understanding.  Covid-19 vaccine status: Completed vaccines  Qualifies for Shingles Vaccine? Yes   Zostavax completed No   Shingrix Completed?: No.    Education has been provided regarding the importance of this vaccine. Patient has been advised to call insurance company to determine out of pocket expense if they have not yet received this vaccine. Advised may also receive vaccine at local pharmacy or Health Dept. Verbalized acceptance and understanding.  Screening Tests Health Maintenance  Topic Date Due  . PNA vac Low Risk Adult (1 of 2 - PCV13) Never done  . COVID-19 Vaccine (3 - Booster for Pfizer series) 01/28/2021  . INFLUENZA VACCINE  04/18/2021  . MAMMOGRAM  01/08/2022  . TETANUS/TDAP  10/11/2028  . COLONOSCOPY (Pts [phone] Insurance coverage will need to be confirmed)  06/15/2030  . DEXA SCAN  Completed  . Hepatitis C Screening  Completed  . HPV VACCINES  Aged Out    Health Maintenance  Health Maintenance Due  Topic Date Due  . PNA vac Low Risk  Adult (1 of 2 - PCV13) Never done    Colorectal cancer screening: Type of screening: Colonoscopy. Completed 06/15/2020.  Repeat every 10 years  Mammogram status: Completed 01/09/2020. Repeat every year  Bone Density status: Completed 01/09/2020. Results reflect: Bone density results: OSTEOPENIA. Repeat every 2 years.  Lung Cancer Screening: (Low Dose CT Chest recommended if Age 93-80 years, 30 pack-year currently smoking OR have quit w/in 15years.) does not qualify.    Additional Screening:  Hepatitis C Screening: does qualify; Completed 10/13/2019  Vision Screening: Recommended annual ophthalmology exams for early detection of glaucoma and other disorders of the eye. Is the patient up to date with their annual eye exam?  Yes  Who is the provider or what is the name of the office in which the patient attends annual eye exams? [facility]  If pt is not established with a provider, would they like to be referred to a provider to establish care? No .   Dental Screening: Recommended annual dental exams for proper oral hygiene  Community Resource Referral / Chronic Care Management: CRR required this visit?  No   CCM required this visit?  No      Plan:     I have personally reviewed and noted the following in the patient's chart:   . Medical and social history . Use of alcohol, tobacco or illicit drugs  . Current medications and supplements . Functional ability and status . Nutritional status . Physical activity . Advanced directives . List of other physicians . Hospitalizations, surgeries, and ER visits in previous 12 months . Vitals . Screenings to include cognitive, depression, and falls . Referrals and appointments  In addition, I have reviewed and discussed with patient certain preventive protocols, quality metrics, and best practice recommendations. A written personalized care plan for preventive services as well as general preventive health recommendations were  provided to patient.   Due to this being a virtual/telephonic visit, the after visit summary with patients personalized plan was offered to patient via office or my-chart. Patient preferred to pick up at office at next visit or via mychart.   Janalyn Shy, LPN   [id number]

## 2021-01-04 NOTE — Progress Notes (Signed)
PCP notes:  Health Maintenance: Prevnar 13- due    Abnormal Screenings: none   Patient concerns: none   Nurse concerns: none   Next PCP appt.: 01/05/2021 @ 8:40 am

## 2021-01-04 NOTE — Patient Instructions (Signed)
Heather [name] , Thank you for taking time to come for your Medicare Wellness Visit. I appreciate your ongoing commitment to your health goals. Please review the following plan we discussed and let me know if I can assist you in the future.   Screening recommendations/referrals: Colonoscopy: Up to date, completed 06/15/2020, due 05/2030 Mammogram: Up to date, completed 01/09/2020, due 12/2020 Bone Density: Up to date, completed 01/09/2020, due 12/2021 Recommended yearly ophthalmology/optometry visit for glaucoma screening and checkup Recommended yearly dental visit for hygiene and checkup  Vaccinations: Influenza vaccine: due Fall 2022 Pneumococcal vaccine: due, will get at upcoming physical  Tdap vaccine: Up to date, completed 10/11/2018, due 09/2028 Shingles vaccine: due, check with your insurance regarding coverage if interested    Covid-19:Up to date, completed 07/31/2020, booster due 01/28/2021  Advanced directives: Advance directive discussed with you today. Even though you declined this today please call our office should you change your mind and we can give you the proper paperwork for you to fill out.  Conditions/risks identified: hypertension, hyperlipidemia   Next appointment: Follow up in one year for your annual wellness visit    Preventive Care 65 Years and Older, Female Preventive care refers to lifestyle choices and visits with your health care provider that can promote health and wellness. What does preventive care include?  A yearly physical exam. This is also called an annual well check.  Dental exams once or twice a year.  Routine eye exams. Ask your health care provider how often you should have your eyes checked.  Personal lifestyle choices, including:  Daily care of your teeth and gums.  Regular physical activity.  Eating a healthy diet.  Avoiding tobacco and drug use.  Limiting alcohol use.  Practicing safe sex.  Taking low-dose aspirin every day.  Taking  vitamin and mineral supplements as recommended by your health care provider. What happens during an annual well check? The services and screenings done by your health care provider during your annual well check will depend on your age, overall health, lifestyle risk factors, and family history of disease. Counseling  Your health care provider may ask you questions about your:  Alcohol use.  Tobacco use.  Drug use.  Emotional well-being.  Home and relationship well-being.  Sexual activity.  Eating habits.  History of falls.  Memory and ability to understand (cognition).  Work and work Astronomer.  Reproductive health. Screening  You may have the following tests or measurements:  Height, weight, and BMI.  Blood pressure.  Lipid and cholesterol levels. These may be checked every 5 years, or more frequently if you are over 73 years old.  Skin check.  Lung cancer screening. You may have this screening every year starting at age 1 if you have a 30-pack-year history of smoking and currently smoke or have quit within the past 15 years.  Fecal occult blood test (FOBT) of the stool. You may have this test every year starting at age 29.  Flexible sigmoidoscopy or colonoscopy. You may have a sigmoidoscopy every 5 years or a colonoscopy every 10 years starting at age 38.  Hepatitis C blood test.  Hepatitis B blood test.  Sexually transmitted disease (STD) testing.  Diabetes screening. This is done by checking your blood sugar (glucose) after you have not eaten for a while (fasting). You may have this done every 1-3 years.  Bone density scan. This is done to screen for osteoporosis. You may have this done starting at age 64.  Mammogram. This may  be done every 1-2 years. Talk to your health care provider about how often you should have regular mammograms. Talk with your health care provider about your test results, treatment options, and if necessary, the need for more  tests. Vaccines  Your health care provider may recommend certain vaccines, such as:  Influenza vaccine. This is recommended every year.  Tetanus, diphtheria, and acellular pertussis (Tdap, Td) vaccine. You may need a Td booster every 10 years.  Zoster vaccine. You may need this after age 41.  Pneumococcal 13-valent conjugate (PCV13) vaccine. One dose is recommended after age 42.  Pneumococcal polysaccharide (PPSV23) vaccine. One dose is recommended after age 67. Talk to your health care provider about which screenings and vaccines you need and how often you need them. This information is not intended to replace advice given to you by your health care provider. Make sure you discuss any questions you have with your health care provider. Document Released: 10/01/2015 Document Revised: 05/24/2016 Document Reviewed: 07/06/2015 Elsevier Interactive Patient Education  2017 [place] Prevention in the Home Falls can cause injuries. They can happen to people of all ages. There are many things you can do to make your home safe and to help prevent falls. What can I do on the outside of my home?  Regularly fix the edges of walkways and driveways and fix any cracks.  Remove anything that might make you trip as you walk through a door, such as a raised step or threshold.  Trim any bushes or trees on the path to your home.  Use bright outdoor lighting.  Clear any walking paths of anything that might make someone trip, such as rocks or tools.  Regularly check to see if handrails are loose or broken. Make sure that both sides of any steps have handrails.  Any raised decks and porches should have guardrails on the edges.  Have any leaves, snow, or ice cleared regularly.  Use sand or salt on walking paths during winter.  Clean up any spills in your garage right away. This includes oil or grease spills. What can I do in the bathroom?  Use night lights.  Install grab bars by the  toilet and in the tub and shower. Do not use towel bars as grab bars.  Use non-skid mats or decals in the tub or shower.  If you need to sit down in the shower, use a plastic, non-slip stool.  Keep the floor dry. Clean up any water that spills on the floor as soon as it happens.  Remove soap buildup in the tub or shower regularly.  Attach bath mats securely with double-sided non-slip rug tape.  Do not have throw rugs and other things on the floor that can make you trip. What can I do in the bedroom?  Use night lights.  Make sure that you have a light by your bed that is easy to reach.  Do not use any sheets or blankets that are too big for your bed. They should not hang down onto the floor.  Have a firm chair that has side arms. You can use this for support while you get dressed.  Do not have throw rugs and other things on the floor that can make you trip. What can I do in the kitchen?  Clean up any spills right away.  Avoid walking on wet floors.  Keep items that you use a lot in easy-to-reach places.  If you need to reach something above you,  use a strong step stool that has a grab bar.  Keep electrical cords out of the way.  Do not use floor polish or wax that makes floors slippery. If you must use wax, use non-skid floor wax.  Do not have throw rugs and other things on the floor that can make you trip. What can I do with my stairs?  Do not leave any items on the stairs.  Make sure that there are handrails on both sides of the stairs and use them. Fix handrails that are broken or loose. Make sure that handrails are as long as the stairways.  Check any carpeting to make sure that it is firmly attached to the stairs. Fix any carpet that is loose or worn.  Avoid having throw rugs at the top or bottom of the stairs. If you do have throw rugs, attach them to the floor with carpet tape.  Make sure that you have a light switch at the top of the stairs and the bottom of  the stairs. If you do not have them, ask someone to add them for you. What else can I do to help prevent falls?  Wear shoes that:  Do not have high heels.  Have rubber bottoms.  Are comfortable and fit you well.  Are closed at the toe. Do not wear sandals.  If you use a stepladder:  Make sure that it is fully opened. Do not climb a closed stepladder.  Make sure that both sides of the stepladder are locked into place.  Ask someone to hold it for you, if possible.  Clearly mark and make sure that you can see:  Any grab bars or handrails.  First and last steps.  Where the edge of each step is.  Use tools that help you move around (mobility aids) if they are needed. These include:  Canes.  Walkers.  Scooters.  Crutches.  Turn on the lights when you go into a dark area. Replace any light bulbs as soon as they burn out.  Set up your furniture so you have a clear path. Avoid moving your furniture around.  If any of your floors are uneven, fix them.  If there are any pets around you, be aware of where they are.  Review your medicines with your doctor. Some medicines can make you feel dizzy. This can increase your chance of falling. Ask your doctor what other things that you can do to help prevent falls. This information is not intended to replace advice given to you by your health care provider. Make sure you discuss any questions you have with your health care provider. Document Released: 07/01/2009 Document Revised: 02/10/2016 Document Reviewed: 10/09/2014 Elsevier Interactive Patient Education  2017 [employer].

## 2021-01-05 ENCOUNTER — Other Ambulatory Visit: Payer: Self-pay

## 2021-01-05 ENCOUNTER — Ambulatory Visit (INDEPENDENT_AMBULATORY_CARE_PROVIDER_SITE_OTHER): Payer: Medicare PPO | Admitting: Primary Care

## 2021-01-05 ENCOUNTER — Encounter: Payer: Self-pay | Admitting: Primary Care

## 2021-01-05 VITALS — BP 126/78 | HR 67 | Temp 97.0°F | Ht 65.25 in | Wt 160.0 lb

## 2021-01-05 DIAGNOSIS — I10 Essential (primary) hypertension: Secondary | ICD-10-CM | POA: Diagnosis not present

## 2021-01-05 DIAGNOSIS — E785 Hyperlipidemia, unspecified: Secondary | ICD-10-CM

## 2021-01-05 DIAGNOSIS — H3530 Unspecified macular degeneration: Secondary | ICD-10-CM

## 2021-01-05 DIAGNOSIS — Z1231 Encounter for screening mammogram for malignant neoplasm of breast: Secondary | ICD-10-CM

## 2021-01-05 DIAGNOSIS — E538 Deficiency of other specified B group vitamins: Secondary | ICD-10-CM

## 2021-01-05 DIAGNOSIS — N182 Chronic kidney disease, stage 2 (mild): Secondary | ICD-10-CM

## 2021-01-05 DIAGNOSIS — H6120 Impacted cerumen, unspecified ear: Secondary | ICD-10-CM | POA: Insufficient documentation

## 2021-01-05 DIAGNOSIS — F3342 Major depressive disorder, recurrent, in full remission: Secondary | ICD-10-CM | POA: Diagnosis not present

## 2021-01-05 DIAGNOSIS — R159 Full incontinence of feces: Secondary | ICD-10-CM

## 2021-01-05 DIAGNOSIS — H6121 Impacted cerumen, right ear: Secondary | ICD-10-CM

## 2021-01-05 DIAGNOSIS — Z23 Encounter for immunization: Secondary | ICD-10-CM

## 2021-01-05 DIAGNOSIS — Z0000 Encounter for general adult medical examination without abnormal findings: Secondary | ICD-10-CM

## 2021-01-05 LAB — CBC
HCT: 39 % (ref 36.0–46.0)
Hemoglobin: 12.9 g/dL (ref 12.0–15.0)
MCHC: 33.1 g/dL (ref 30.0–36.0)
MCV: 89.5 fl (ref 78.0–100.0)
Platelets: 232 10*3/uL (ref 150.0–400.0)
RBC: 4.35 Mil/uL (ref 3.87–5.11)
RDW: 14.2 % (ref 11.5–15.5)
WBC: 7.4 10*3/uL (ref 4.0–10.5)

## 2021-01-05 LAB — COMPREHENSIVE METABOLIC PANEL
ALT: 21 U/L (ref 0–35)
AST: 18 U/L (ref 0–37)
Albumin: 4.1 g/dL (ref 3.5–5.2)
Alkaline Phosphatase: 100 U/L (ref 39–117)
BUN: 15 mg/dL (ref 6–23)
CO2: 27 mEq/L (ref 19–32)
Calcium: 9.5 mg/dL (ref 8.4–10.5)
Chloride: 107 mEq/L (ref 96–112)
Creatinine, Ser: 1.13 mg/dL (ref 0.40–1.20)
GFR: 50.65 mL/min — ABNORMAL LOW (ref >60.00)
Glucose, Bld: 102 mg/dL — ABNORMAL HIGH (ref 70–99)
Potassium: 4.9 mEq/L (ref 3.5–5.1)
Sodium: 142 mEq/L (ref 135–145)
Total Bilirubin: 1.4 mg/dL — ABNORMAL HIGH (ref 0.2–1.2)
Total Protein: 7.2 g/dL (ref 6.0–8.3)

## 2021-01-05 LAB — LIPID PANEL
Cholesterol: 140 mg/dL (ref 0–200)
HDL: 45.8 mg/dL (ref >39.00)
LDL Cholesterol: 72 mg/dL (ref 0–99)
NonHDL: 94.58
Total CHOL/HDL Ratio: 3
Triglycerides: 112 mg/dL (ref 0.0–149.0)
VLDL: 22.4 mg/dL (ref 0.0–40.0)

## 2021-01-05 LAB — VITAMIN B12: Vitamin B-12: 179 pg/mL — ABNORMAL LOW (ref 211–911)

## 2021-01-05 MED ORDER — SIMVASTATIN 40 MG PO TABS: ORAL_TABLET | ORAL | 3 refills | Status: DC

## 2021-01-05 MED ORDER — LOSARTAN POTASSIUM 50 MG PO TABS: 50.0000 mg | ORAL_TABLET | Freq: Every day | ORAL | 3 refills | Status: DC

## 2021-01-05 MED ORDER — DULOXETINE HCL 30 MG PO CPEP: 30.0000 mg | ORAL_CAPSULE | Freq: Every day | ORAL | 3 refills | Status: DC

## 2021-01-05 MED ORDER — ZOSTER VAC RECOMB ADJUVANTED 50 MCG/0.5ML IM SUSR: 0.5000 mL | Freq: Once | INTRAMUSCULAR | 1 refills | Status: AC

## 2021-01-05 MED ORDER — ATENOLOL 25 MG PO TABS: 25.0000 mg | ORAL_TABLET | Freq: Every day | ORAL | 3 refills | Status: DC

## 2021-01-05 NOTE — Assessment & Plan Note (Signed)
Denies concerns today, completed PT and is doing well. Continue to monitor.

## 2021-01-05 NOTE — Assessment & Plan Note (Signed)
Compliant to simvastatin 40 mg, continue same.  Repeat lipid panel pending. 

## 2021-01-05 NOTE — Assessment & Plan Note (Signed)
Doing well on Cymbalta 30 mg, continue same. Refills provided.

## 2021-01-05 NOTE — Assessment & Plan Note (Addendum)
Noted to right canal on exam, patient also with symptoms of ear fullness.  Patient consented to irrigation. Right canal irrigated. Canal appears irritated post irrigation. No damage noted to TM. She tolerated well.  Some residual cerumen remained, discussed use of Debrox drops and offered to re-evaluate in 1-2 weeks.

## 2021-01-05 NOTE — Assessment & Plan Note (Signed)
Well controlled in the office today, continue losartan 50 mg and atenolol 25 mg daily.   Labs pending.

## 2021-01-05 NOTE — Patient Instructions (Addendum)
Stop by the lab prior to leaving today. I will notify you of your results once received.   Take the shingles vaccine to the pharmacy.  [facility] to schedule your mamm[facility].   Continue exercising. You should be getting 150 minutes of moderate intensity exercise weekly.  Continue to work on a healthy diet. Ensure you are consuming 64 ounces of water daily.  It was a pleasure to see you today!   Preventive Care 67 Years and Older, Female Preventive care refers to lifestyle choices and visits with your health care provider that can promote health and wellness. This includes:  A yearly physical exam. This is also called an annual wellness visit.  Regular dental and eye exams.  Immunizations.  Screening for certain conditions.  Healthy lifestyle choices, such as: ? Eating a healthy diet. ? Getting regular exercise. ? Not using drugs or products that contain nicotine and tobacco. ? Limiting alcohol use. What can I expect for my preventive care visit? Physical exam Your health care provider will check your:  Height and weight. These may be used to calculate your BMI (body mass index). BMI is a measurement that tells if you are at a healthy weight.  Heart rate and blood pressure.  Body temperature.  Skin for abnormal spots. Counseling Your health care provider may ask you questions about your:  Past medical problems.  Family's medical history.  Alcohol, tobacco, and drug use.  Emotional well-being.  Home life and relationship well-being.  Sexual activity.  Diet, exercise, and sleep habits.  History of falls.  Memory and ability to understand (cognition).  Work and work Statistician.  Pregnancy and menstrual history.  Access to firearms. What immunizations do I need? Vaccines are usually given at various ages, according to a schedule. Your health care provider will recommend vaccines for you based on your age, medical history, and lifestyle or  other factors, such as travel or where you work.   What tests do I need? Blood tests  Lipid and cholesterol levels. These may be checked every 5 years, or more often depending on your overall health.  Hepatitis C test.  Hepatitis B test. Screening  Lung cancer screening. You may have this screening every year starting at age 41 if you have a 30-pack-year history of smoking and currently smoke or have quit within the past 15 years.  Colorectal cancer screening. ? All adults should have this screening starting at age 31 and continuing until age 1. ? Your health care provider may recommend screening at age 92 if you are at increased risk. ? You will have tests every 1-10 years, depending on your results and the type of screening test.  Diabetes screening. ? This is done by checking your blood sugar (glucose) after you have not eaten for a while (fasting). ? You may have this done every 1-3 years.  Mammogram. ? This may be done every 1-2 years. ? Talk with your health care provider about how often you should have regular mammograms.  Abdominal aortic aneurysm (AAA) screening. You may need this if you are a current or former smoker.  BRCA-related cancer screening. This may be done if you have a family history of breast, ovarian, tubal, or peritoneal cancers. Other tests  STD (sexually transmitted disease) testing, if you are at risk.  Bone density scan. This is done to screen for osteoporosis. You may have this done starting at age 23. Talk with your health care provider about your test results, treatment  options, and if necessary, the need for more tests. Follow these instructions at home: Eating and drinking  Eat a diet that includes fresh fruits and vegetables, whole grains, lean protein, and low-fat dairy products. Limit your intake of foods with high amounts of sugar, saturated fats, and salt.  Take vitamin and mineral supplements as recommended by your health care  provider.  Do not drink alcohol if your health care provider tells you not to drink.  If you drink alcohol: ? Limit how much you have to 0-1 drink a day. ? Be aware of how much alcohol is in your drink. In the [place] one drink equals one 12 oz bottle of beer (355 mL), one 5 oz glass of wine (148 mL), or one 1 oz glass of hard liquor (44 mL).   Lifestyle  Take daily care of your teeth and gums. Brush your teeth every morning and night with fluoride toothpaste. Floss one time each day.  Stay active. Exercise for at least 30 minutes 5 or more days each week.  Do not use any products that contain nicotine or tobacco, such as cigarettes, e-cigarettes, and chewing tobacco. If you need help quitting, ask your health care provider.  Do not use drugs.  If you are sexually active, practice safe sex. Use a condom or other form of protection in order to prevent STIs (sexually transmitted infections).  Talk with your health care provider about taking a low-dose aspirin or statin.  Find healthy ways to cope with stress, such as: ? Meditation, yoga, or listening to music. ? Journaling. ? Talking to a trusted person. ? Spending time with friends and family. Safety  Always wear your seat belt while driving or riding in a vehicle.  Do not drive: ? If you have been drinking alcohol. Do not ride with someone who has been drinking. ? When you are tired or distracted. ? While texting.  Wear a helmet and other protective equipment during sports activities.  If you have firearms in your house, make sure you follow all gun safety procedures. What's next?  Visit your health care provider once a year for an annual wellness visit.  Ask your health care provider how often you should have your eyes and teeth checked.  Stay up to date on all vaccines. This information is not intended to replace advice given to you by your health care provider. Make sure you discuss any questions you have with your  health care provider. Document Revised: 08/25/2020 Document Reviewed: 08/29/2018 Elsevier Patient Education  2021 [employer].

## 2021-01-05 NOTE — Progress Notes (Signed)
Subjective:    Patient ID: Heather [name], female    DOB: June 21, 1956, 65 y.o.   MRN: [id number]  HPI  Heather [name] is a very pleasant 65 y.o. female who presents today for complete physical.  Immunizations: -Tetanus: 2020 -Influenza: Did not complete -Covid-19: Completed three vaccines -Shingles: Never completed -Pneumonia: Never completed   Diet: She endorses a healthy diet.  Exercise: She is walking several times weekly   Eye exam: Completed recently  Dental exam: Completes semi-annually   Mammogram: April 2021 Dexa: April 2021 Colonoscopy: 2021, due in September 2022  BP Readings from Last 3 Encounters:  01/05/21 126/78  02/10/20 126/84  10/17/19 124/84       Review of Systems  Constitutional: Negative for unexpected weight change.  HENT: Negative for rhinorrhea.        Ear fullness to right ear  Respiratory: Negative for shortness of breath.   Cardiovascular: Negative for chest pain.  Gastrointestinal: Negative for constipation and diarrhea.  Genitourinary: Negative for difficulty urinating.  Musculoskeletal: Negative for arthralgias and myalgias.  Skin: Negative for rash.  Allergic/Immunologic: Negative for environmental allergies.  Neurological: Negative for dizziness, numbness and headaches.  Psychiatric/Behavioral: The patient is not nervous/anxious.          Past Medical History:  Diagnosis Date  . B12 deficiency   . Chronic kidney disease   . Depression   . GERD (gastroesophageal reflux disease)   . Hyperlipidemia   . Hypertension    had ankle swelling with amlodipine  . Infection of urinary tract 12/16/2014  . RLS (restless legs syndrome)     Social History   Socioeconomic History  . Marital status: Married    Spouse name: Not on file  . Number of children: Not on file  . Years of education: Not on file  . Highest education level: Not on file  Occupational History  . Occupation: retired Ambulance person: Retired   . Occupation: HR (part-time)    Employer: [employer]  Tobacco Use  . Smoking status: Never Smoker  . Smokeless tobacco: Never Used  Substance and Sexual Activity  . Alcohol use: No  . Drug use: No  . Sexual activity: Not on file  Other Topics Concern  . Not on file  Social History Narrative   Works part-time in [employer] for [employer] in [employer] in [place]      Married   Social Determinants of Health   Financial Resource Strain: Low Risk   . Difficulty of Paying Living Expenses: Not hard at all  Food Insecurity: No Food Insecurity  . Worried About Programme researcher, broadcasting/film/video in the Last Year: Never true  . Ran Out of Food in the Last Year: Never true  Transportation Needs: No Transportation Needs  . Lack of Transportation (Medical): No  . Lack of Transportation (Non-Medical): No  Physical Activity: Sufficiently Active  . Days of Exercise per Week: 4 days  . Minutes of Exercise per Session: 60 min  Stress: No Stress Concern Present  . Feeling of Stress : Not at all  Social Connections: Not on file  Intimate Partner Violence: Not At Risk  . Fear of Current or Ex-Partner: No  . Emotionally Abused: No  . Physically Abused: No  . Sexually Abused: No    Past Surgical History:  Procedure Laterality Date  . CATARACT EXTRACTION, BILATERAL  2021  . SALPINGOOPHORECTOMY     right; laproscopic  Family History  Problem Relation Age of Onset  . Hypertension Mother   . Heart attack Mother 82       sudden cardiac death  . COPD Father   . Heart attack Father 68       developed ischemic cardiomyopathy    Allergies  Allergen Reactions  . Augmentin [Amoxicillin-Pot Clavulanate] Diarrhea    Current Outpatient Medications on File Prior to Visit  Medication Sig Dispense Refill  . acetaminophen (TYLENOL) 325 MG tablet Take 650 mg by mouth every 6 (six) hours as needed.    . cyanocobalamin (,VITAMIN B-12,) 1000 MCG/ML injection Inject 1,000 mcg into the muscle every  30 (thirty) days. Started 01/2019, due for recheck B12 in August 2020    . omeprazole (PRILOSEC) 20 MG capsule Take 1 capsule (20 mg total) by mouth 2 (two) times daily. 180 capsule 3   No current facility-administered medications on file prior to visit.    BP 126/78   Pulse 67   Temp (!) 97 F (36.1 C) (Temporal)   Ht 5' 5.25" (1.657 m)   Wt 160 lb (72.6 kg)   SpO2 99%   BMI 26.42 kg/m  Objective:   Physical Exam HENT:     Left Ear: Tympanic membrane and ear canal normal.     Ears:     Comments: Cerumen impaction to right canal.    Nose: Nose normal.  Eyes:     Conjunctiva/sclera: Conjunctivae normal.     Pupils: Pupils are equal, round, and reactive to light.  Neck:     Thyroid: No thyromegaly.  Cardiovascular:     Rate and Rhythm: Normal rate and regular rhythm.     Heart sounds: No murmur heard.   Pulmonary:     Effort: Pulmonary effort is normal.     Breath sounds: Normal breath sounds. No rales.  Abdominal:     General: Bowel sounds are normal.     Palpations: Abdomen is soft.     Tenderness: There is no abdominal tenderness.  Musculoskeletal:        General: Normal range of motion.     Cervical back: Neck supple.  Lymphadenopathy:     Cervical: No cervical adenopathy.  Skin:    General: Skin is warm and dry.     Findings: No rash.  Neurological:     Mental Status: She is alert and oriented to person, place, and time.     Cranial Nerves: No cranial nerve deficit.     Deep Tendon Reflexes: Reflexes are normal and symmetric.  Psychiatric:        Mood and Affect: Mood normal.           Assessment & Plan:      This visit occurred during the SARS-CoV-2 public health emergency.  Safety protocols were in place, including screening questions prior to the visit, additional usage of staff PPE, and extensive cleaning of exam room while observing appropriate contact time as indicated for disinfecting solutions.

## 2021-01-05 NOTE — Assessment & Plan Note (Signed)
No longer taking oral B12, stopped about 6 months ago. Repeat labs pending.

## 2021-01-05 NOTE — Assessment & Plan Note (Signed)
Repeat labs pending today.  Discussed to avoid NSAID's.  Continue BP control.

## 2021-01-05 NOTE — Assessment & Plan Note (Signed)
Following with ophthalmology. 

## 2021-01-05 NOTE — Assessment & Plan Note (Signed)
Declines Pneumonia vaccine, Rx for Shingrix provided. Other vaccines UTD. Mammogram due, orders placed. Bone density scan UTD. Colonoscopy UTD, due in Fall 2022.  Discussed the importance of a healthy diet and regular exercise in order for weight loss, and to reduce the risk of any potential medical problems.  Exam today as noted. Labs pending.

## 2021-01-11 ENCOUNTER — Other Ambulatory Visit: Payer: Self-pay

## 2021-01-11 ENCOUNTER — Ambulatory Visit (INDEPENDENT_AMBULATORY_CARE_PROVIDER_SITE_OTHER): Payer: Medicare PPO

## 2021-01-11 DIAGNOSIS — E538 Deficiency of other specified B group vitamins: Secondary | ICD-10-CM | POA: Diagnosis not present

## 2021-01-11 MED ORDER — CYANOCOBALAMIN 1000 MCG/ML IJ SOLN
1000.0000 ug | INTRAMUSCULAR | Status: AC
  Administered 2021-01-11 – 2021-01-25 (×2): 1000 ug via INTRAMUSCULAR

## 2021-01-11 NOTE — Progress Notes (Signed)
Per orders of Vernona Rieger, NP, injection of B12 given Left Deltoid by Roena Malady.  Every 2 weeks through 02/22/21 then monthly  Patient tolerated injection well.

## 2021-01-14 DIAGNOSIS — S52124A Nondisplaced fracture of head of right radius, initial encounter for closed fracture: Secondary | ICD-10-CM | POA: Diagnosis not present

## 2021-01-19 DIAGNOSIS — S52124D Nondisplaced fracture of head of right radius, subsequent encounter for closed fracture with routine healing: Secondary | ICD-10-CM | POA: Diagnosis not present

## 2021-01-25 ENCOUNTER — Ambulatory Visit (INDEPENDENT_AMBULATORY_CARE_PROVIDER_SITE_OTHER): Payer: Medicare PPO

## 2021-01-25 ENCOUNTER — Other Ambulatory Visit: Payer: Self-pay

## 2021-01-25 DIAGNOSIS — E538 Deficiency of other specified B group vitamins: Secondary | ICD-10-CM

## 2021-01-25 MED ORDER — CYANOCOBALAMIN 1000 MCG/ML IJ SOLN: 1000.0000 ug | Freq: Once | INTRAMUSCULAR | Status: AC

## 2021-01-25 NOTE — Progress Notes (Signed)
Per orders of Mayra Reel, NP, Every 2 week injection of B12 1000 mcg/ml  given by Eual Fines, CMA in right deltoid. Patient tolerated injection well.

## 2021-01-26 DIAGNOSIS — M25521 Pain in right elbow: Secondary | ICD-10-CM | POA: Diagnosis not present

## 2021-02-04 DIAGNOSIS — S52124D Nondisplaced fracture of head of right radius, subsequent encounter for closed fracture with routine healing: Secondary | ICD-10-CM | POA: Diagnosis not present

## 2021-02-04 DIAGNOSIS — M25521 Pain in right elbow: Secondary | ICD-10-CM | POA: Diagnosis not present

## 2021-02-08 ENCOUNTER — Ambulatory Visit: Payer: Medicare PPO | Admitting: Family Medicine

## 2021-02-08 ENCOUNTER — Ambulatory Visit: Payer: Medicare PPO

## 2021-02-09 ENCOUNTER — Other Ambulatory Visit: Payer: Self-pay

## 2021-02-09 ENCOUNTER — Ambulatory Visit (INDEPENDENT_AMBULATORY_CARE_PROVIDER_SITE_OTHER): Payer: Medicare PPO

## 2021-02-09 DIAGNOSIS — E538 Deficiency of other specified B group vitamins: Secondary | ICD-10-CM

## 2021-02-09 MED ORDER — CYANOCOBALAMIN 1000 MCG/ML IJ SOLN
1000.0000 ug | Freq: Once | INTRAMUSCULAR | Status: AC
  Administered 2021-02-09: 1000 ug via INTRAMUSCULAR

## 2021-02-09 NOTE — Progress Notes (Signed)
Patient presented for B 12 injection given by Mykeria Garman, CMA to left deltoid, patient voiced no concerns nor showed any signs of distress during injection.  

## 2021-02-22 DIAGNOSIS — S52124D Nondisplaced fracture of head of right radius, subsequent encounter for closed fracture with routine healing: Secondary | ICD-10-CM | POA: Diagnosis not present

## 2021-02-22 DIAGNOSIS — M25521 Pain in right elbow: Secondary | ICD-10-CM | POA: Diagnosis not present

## 2021-02-24 ENCOUNTER — Ambulatory Visit (INDEPENDENT_AMBULATORY_CARE_PROVIDER_SITE_OTHER): Payer: Medicare PPO

## 2021-02-24 ENCOUNTER — Other Ambulatory Visit: Payer: Self-pay

## 2021-02-24 DIAGNOSIS — E538 Deficiency of other specified B group vitamins: Secondary | ICD-10-CM

## 2021-02-24 MED ORDER — CYANOCOBALAMIN 1000 MCG/ML IJ SOLN
1000.0000 ug | Freq: Once | INTRAMUSCULAR | Status: AC
  Administered 2021-02-24: 1000 ug via INTRAMUSCULAR

## 2021-02-24 NOTE — Progress Notes (Signed)
Per orders of Mayra Reel, injection of B12, given by Erby Pian. Patient tolerated injection well.

## 2021-03-01 ENCOUNTER — Other Ambulatory Visit: Payer: Self-pay

## 2021-03-01 ENCOUNTER — Other Ambulatory Visit (HOSPITAL_BASED_OUTPATIENT_CLINIC_OR_DEPARTMENT_OTHER): Payer: Self-pay

## 2021-03-01 ENCOUNTER — Ambulatory Visit: Payer: Medicare PPO | Attending: Internal Medicine

## 2021-03-01 DIAGNOSIS — Z23 Encounter for immunization: Secondary | ICD-10-CM

## 2021-03-01 MED ORDER — PFIZER-BIONT COVID-19 VAC-TRIS 30 MCG/0.3ML IM SUSP
INTRAMUSCULAR | 0 refills | Status: DC
  Filled 2021-03-01: qty 0.3, 1d supply, fill #0

## 2021-03-01 NOTE — Progress Notes (Signed)
   Covid-[facility] Vaccination [facility]  Name:  Heather [name]    MRN: [id number] DOB: May 09, 1956  03/01/2021  Heather [name] was observed post Covid-19 immunization for 15 minutes without incident. She was provided with Vaccine Information Sheet and instruction to access the V-Safe system.   Heather [name] was instructed to call 911 with any severe reactions post vaccine: Difficulty breathing  Swelling of face and throat  A fast heartbeat  A bad rash all over body  Dizziness and weakness   Immunizations Administered     Name Date Dose VIS Date Route   PFIZER Comrnaty(Gray TOP) Covid-19 Vaccine 03/01/2021 10:53 AM 0.3 mL 08/26/2020 Intramuscular   Manufacturer: [employer], [employer]   Lot: [id number]   NDC: [phone]

## 2021-03-22 ENCOUNTER — Ambulatory Visit
Admission: RE | Admit: 2021-03-22 | Discharge: 2021-03-22 | Disposition: A | Discharge status: Home / Self Care | Payer: Medicare PPO | Source: Ambulatory Visit | Attending: Primary Care | Admitting: Primary Care

## 2021-03-22 ENCOUNTER — Other Ambulatory Visit: Payer: Self-pay

## 2021-03-22 DIAGNOSIS — Z1231 Encounter for screening mammogram for malignant neoplasm of breast: Secondary | ICD-10-CM | POA: Diagnosis not present

## 2021-04-08 ENCOUNTER — Telehealth: Payer: Self-pay | Admitting: Primary Care

## 2021-04-08 DIAGNOSIS — E538 Deficiency of other specified B group vitamins: Secondary | ICD-10-CM

## 2021-04-08 NOTE — Telephone Encounter (Signed)
Is she ok to have done?

## 2021-04-08 NOTE — Telephone Encounter (Signed)
Yes, just need to make sure she has her lab draw before B12 injection  Will order lab.

## 2021-04-08 NOTE — Telephone Encounter (Signed)
Heather [name] called in and asked to make an appointment for b-12 lab and b-12 inj . But I didn't see any labs for it. She is set to come in on 7/27 @ 245

## 2021-04-08 NOTE — Addendum Note (Signed)
Addended by: Doreene Nest on: 04/08/2021 04:36 PM   Modules accepted: Orders

## 2021-04-09 ENCOUNTER — Other Ambulatory Visit: Payer: Self-pay | Admitting: Primary Care

## 2021-04-11 NOTE — Telephone Encounter (Signed)
Called patient informed. She has both appointments scheduled and will have lab before injection. No further questions at this time.

## 2021-04-11 NOTE — Telephone Encounter (Signed)
Left message to return call to our office.  

## 2021-04-13 ENCOUNTER — Ambulatory Visit (INDEPENDENT_AMBULATORY_CARE_PROVIDER_SITE_OTHER): Payer: Medicare PPO

## 2021-04-13 ENCOUNTER — Other Ambulatory Visit: Payer: Self-pay

## 2021-04-13 ENCOUNTER — Other Ambulatory Visit (INDEPENDENT_AMBULATORY_CARE_PROVIDER_SITE_OTHER): Payer: Medicare PPO

## 2021-04-13 DIAGNOSIS — E538 Deficiency of other specified B group vitamins: Secondary | ICD-10-CM | POA: Diagnosis not present

## 2021-04-13 MED ORDER — CYANOCOBALAMIN 1000 MCG/ML IJ SOLN
1000.0000 ug | Freq: Once | INTRAMUSCULAR | Status: AC
  Administered 2021-04-13: 1000 ug via INTRAMUSCULAR

## 2021-04-13 NOTE — Progress Notes (Signed)
Per orders of Mayra Reel, NP, first monthly injection of b12 given by Erby Pian. Patient tolerated injection well.

## 2021-04-14 LAB — VITAMIN B12: Vitamin B-12: 288 pg/mL (ref 211–911)

## 2021-04-25 DIAGNOSIS — H353112 Nonexudative age-related macular degeneration, right eye, intermediate dry stage: Secondary | ICD-10-CM | POA: Diagnosis not present

## 2021-04-25 DIAGNOSIS — H353123 Nonexudative age-related macular degeneration, left eye, advanced atrophic without subfoveal involvement: Secondary | ICD-10-CM | POA: Diagnosis not present

## 2021-05-03 ENCOUNTER — Ambulatory Visit: Payer: Medicare PPO | Admitting: Family Medicine

## 2021-05-03 ENCOUNTER — Other Ambulatory Visit: Payer: Self-pay

## 2021-05-03 ENCOUNTER — Encounter: Payer: Self-pay | Admitting: Family Medicine

## 2021-05-03 VITALS — BP 169/89 | HR 65 | Temp 97.9°F | Ht 65.25 in | Wt 157.8 lb

## 2021-05-03 DIAGNOSIS — S81802A Unspecified open wound, left lower leg, initial encounter: Secondary | ICD-10-CM | POA: Diagnosis not present

## 2021-05-03 DIAGNOSIS — W540XXA Bitten by dog, initial encounter: Secondary | ICD-10-CM

## 2021-05-03 HISTORY — DX: Bitten by dog, initial encounter: W54.0XXA

## 2021-05-03 MED ORDER — DOXYCYCLINE HYCLATE 100 MG PO TABS
100.0000 mg | ORAL_TABLET | Freq: Two times a day (BID) | ORAL | 0 refills | Status: DC
Start: 2021-05-03 — End: 2021-06-20

## 2021-05-03 NOTE — Assessment & Plan Note (Signed)
Healing wound. Pt uptodate with Tdap and dog vaccinated for rabies per report.  Healing wound, given deep punctures.. will treat with antibiotic prophylaxis to cover pasturella. % day course of doxy ( secondline given augmentin intolerance)

## 2021-05-03 NOTE — Progress Notes (Signed)
Patient ID: Heather [name], female    DOB: [birth year], 65 y.o.   MRN: [id number]  This visit was conducted in person.  BP (!) 169/89   Pulse 65   Temp 97.9 F (36.6 C) (Temporal)   Ht 5' 5.25" (1.657 m)   Wt 157 lb 12 oz (71.6 kg)   SpO2 99%   BMI 26.05 kg/m    CC: Chief Complaint  Patient presents with   Animal Bite    Dog bite Left Lower Leg-Happened Friday Evening    Subjective:   HPI: Heather [name] is a 65 y.o. female presenting on 05/03/2021 for Animal Bite (Dog bite Left Lower Leg-Happened Friday Evening)  She reports she was bitten by the next door neighbors dog... occurred when dog ran out past door.   On 04/29/2021  She has washed wound, has been applying neosporin and  steri- strips initially. Pain with walking, pain is improving some overall.  No redness spreading, no dischrage, no odor.  Left leg welling.  Tdap 2020     Dog vaccinated for rabies per neighbor report  Relevant past medical, surgical, family and social history reviewed and updated as indicated. Interim medical history since our last visit reviewed. Allergies and medications reviewed and updated. Outpatient Medications Prior to Visit  Medication Sig Dispense Refill   DULoxetine (CYMBALTA) 30 MG capsule Take 1 capsule (30 mg total) by mouth daily. For Anxiety and depression. 90 capsule 3   losartan (COZAAR) 50 MG tablet Take 1 tablet (50 mg total) by mouth daily. For blood pressure. 90 tablet 3   omeprazole (PRILOSEC) 20 MG capsule Take 1 capsule (20 mg total) by mouth 2 (two) times daily. 180 capsule 3   simvastatin (ZOCOR) 40 MG tablet TAKE 1 TABLET BY MOUTH AT BEDTIME FOR CHOLESTEROL 90 tablet 3   acetaminophen (TYLENOL) 325 MG tablet Take 650 mg by mouth every 6 (six) hours as needed.     atenolol (TENORMIN) 25 MG tablet Take 1 tablet (25 mg total) by mouth daily. For Blood pressure 90 tablet 3   COVID-19 mRNA Vac-TriS, Pfizer, (PFIZER-BIONT COVID-19 VAC-TRIS) SUSP injection Inject into  the muscle. 0.3 mL 0   cyanocobalamin (,VITAMIN B-12,) 1000 MCG/ML injection Inject 1,000 mcg into the muscle every 30 (thirty) days. Started 01/2019, due for recheck B12 in August 2020     Facility-Administered Medications Prior to Visit  Medication Dose Route Frequency Provider Last Rate Last Admin   cyanocobalamin ((VITAMIN B-12)) injection 1,000 mcg  1,000 mcg Intramuscular Once Doreene Nest, NP         Per HPI unless specifically indicated in ROS section below Review of Systems  Constitutional:  Negative for fatigue and fever.  HENT:  Negative for ear pain.   Eyes:  Negative for pain.  Respiratory:  Negative for chest tightness and shortness of breath.   Cardiovascular:  Negative for chest pain, palpitations and leg swelling.  Gastrointestinal:  Negative for abdominal pain.  Genitourinary:  Negative for dysuria.  Objective:  BP (!) 169/89   Pulse 65   Temp 97.9 F (36.6 C) (Temporal)   Ht 5' 5.25" (1.657 m)   Wt 157 lb 12 oz (71.6 kg)   SpO2 99%   BMI 26.05 kg/m   Wt Readings from Last 3 Encounters:  05/03/21 157 lb 12 oz (71.6 kg)  01/05/21 160 lb (72.6 kg)  02/10/20 157 lb 12 oz (71.6 kg)      Physical Exam Constitutional:  General: She is not in acute distress.    Appearance: Normal appearance. She is well-developed. She is not ill-appearing or toxic-appearing.  HENT:     Head: Normocephalic.     Right Ear: Hearing normal. Tympanic membrane is not erythematous, retracted or bulging.     Left Ear: Hearing normal. Tympanic membrane is not erythematous, retracted or bulging.     Nose: No mucosal edema.     Right Sinus: No maxillary sinus tenderness or frontal sinus tenderness.     Left Sinus: No maxillary sinus tenderness or frontal sinus tenderness.     Mouth/Throat:     Pharynx: Uvula midline.  Eyes:     General: Lids are normal. Lids are everted, no foreign bodies appreciated.     Conjunctiva/sclera: Conjunctivae normal.     Pupils: Pupils are equal,  round, and reactive to light.  Neck:     Thyroid: No thyroid mass or thyromegaly.     Trachea: Trachea normal.  Cardiovascular:     Rate and Rhythm: Normal rate and regular rhythm.     Pulses: Normal pulses.     Heart sounds: Normal heart sounds, S1 normal and S2 normal. No murmur heard.   No friction rub. No gallop.  Pulmonary:     Effort: Pulmonary effort is normal. No tachypnea or respiratory distress.     Breath sounds: Normal breath sounds. No decreased breath sounds, wheezing, rhonchi or rales.  Musculoskeletal:     Cervical back: Normal range of motion and neck supple.  Skin:    General: Skin is warm and dry.     Capillary Refill: Capillary refill takes less than 2 seconds.  Neurological:     General: No focal deficit present.     Mental Status: She is alert and oriented to person, place, and time.     Sensory: No sensory deficit.     Motor: No weakness.     Deep Tendon Reflexes: Reflexes normal.  Psychiatric:        Mood and Affect: Mood is not anxious or depressed.        Speech: Speech normal.        Behavior: Behavior is cooperative.       Results for orders placed or performed in visit on 04/13/21  Vitamin B12  Result Value Ref Range   Vitamin B-12 288 211 - 911 pg/mL    This visit occurred during the SARS-CoV-2 public health emergency.  Safety protocols were in place, including screening questions prior to the visit, additional usage of staff PPE, and extensive cleaning of exam room while observing appropriate contact time as indicated for disinfecting solutions.   COVID 19 screen:  No recent travel or known exposure to COVID19 The patient denies respiratory symptoms of COVID 19 at this time. The importance of social distancing was discussed today.   Assessment and Plan Problem List Items Addressed This Visit     Dog bite - Primary    Healing wound. Pt uptodate with Tdap and dog vaccinated for rabies per report.  Healing wound, given deep punctures.. will  treat with antibiotic prophylaxis to cover pasturella. % day course of doxy ( secondline given augmentin intolerance)           Kerby Nora, MD

## 2021-05-03 NOTE — Patient Instructions (Signed)
Complete antibiotic prophylaxis to cover given puncture wounds/pasturella infection.

## 2021-05-26 ENCOUNTER — Ambulatory Visit (INDEPENDENT_AMBULATORY_CARE_PROVIDER_SITE_OTHER): Payer: Medicare PPO | Admitting: *Deleted

## 2021-05-26 ENCOUNTER — Other Ambulatory Visit: Payer: Self-pay

## 2021-05-26 DIAGNOSIS — E538 Deficiency of other specified B group vitamins: Secondary | ICD-10-CM | POA: Diagnosis not present

## 2021-05-26 MED ORDER — CYANOCOBALAMIN 1000 MCG/ML IJ SOLN
1000.0000 ug | Freq: Once | INTRAMUSCULAR | Status: AC
  Administered 2021-05-26: 1000 ug via INTRAMUSCULAR

## 2021-05-26 NOTE — Progress Notes (Signed)
Per orders of Mayra Reel, NP, injection of B12 given by Blenda Mounts M in left deltoid. Patient tolerated injection well.

## 2021-06-20 ENCOUNTER — Other Ambulatory Visit: Payer: Self-pay

## 2021-06-20 ENCOUNTER — Telehealth (INDEPENDENT_AMBULATORY_CARE_PROVIDER_SITE_OTHER): Payer: Medicare PPO | Admitting: Internal Medicine

## 2021-06-20 ENCOUNTER — Encounter: Payer: Self-pay | Admitting: Internal Medicine

## 2021-06-20 DIAGNOSIS — U071 COVID-19: Secondary | ICD-10-CM

## 2021-06-20 HISTORY — DX: COVID-19: U07.1

## 2021-06-20 NOTE — Progress Notes (Signed)
Subjective:    Patient ID: Heather [name], female    DOB: 1956/03/10, 65 y.o.   MRN: [id number]  HPI Video virtual visit due to COVID infection Identification done Reviewed limitations and billing and she gave consent Participants---patient in her home and I am in my office  Started 2 days ago---throat burning and nose stopped up Yesterday --"more of the same" COVID test yesterday negative Got very stopped up last night---but throat is better Low grade fever last night--not today Test a few hours ago was positive Dry cough---no SOB Some aching--no chills  Took alka seltzer plus----seemed to help some Tylenol also  Current Outpatient Medications on File Prior to Visit  Medication Sig Dispense Refill   acetaminophen (TYLENOL) 325 MG tablet Take 650 mg by mouth every 6 (six) hours as needed.     atenolol (TENORMIN) 25 MG tablet Take 1 tablet (25 mg total) by mouth daily. For Blood pressure 90 tablet 3   DULoxetine (CYMBALTA) 30 MG capsule Take 1 capsule (30 mg total) by mouth daily. For Anxiety and depression. 90 capsule 3   losartan (COZAAR) 50 MG tablet Take 1 tablet (50 mg total) by mouth daily. For blood pressure. 90 tablet 3   omeprazole (PRILOSEC) 20 MG capsule Take 1 capsule (20 mg total) by mouth 2 (two) times daily. 180 capsule 3   simvastatin (ZOCOR) 40 MG tablet TAKE 1 TABLET BY MOUTH AT BEDTIME FOR CHOLESTEROL 90 tablet 3   Current Facility-Administered Medications on File Prior to Visit  Medication Dose Route Frequency Provider Last Rate Last Admin   cyanocobalamin ((VITAMIN B-12)) injection 1,000 mcg  1,000 mcg Intramuscular Once Doreene Nest, NP        Allergies  Allergen Reactions   Augmentin [Amoxicillin-Pot Clavulanate] Diarrhea    Past Medical History:  Diagnosis Date   B12 deficiency    Chronic kidney disease    Depression    GERD (gastroesophageal reflux disease)    Hyperlipidemia    Hypertension    had ankle swelling with amlodipine    Infection of urinary tract 12/16/2014   RLS (restless legs syndrome)     Past Surgical History:  Procedure Laterality Date   CATARACT EXTRACTION, BILATERAL  2021   SALPINGOOPHORECTOMY     right; laproscopic    Family History  Problem Relation Age of Onset   Hypertension Mother    Heart attack Mother 55       sudden cardiac death   COPD Father    Heart attack Father 39       developed ischemic cardiomyopathy    Social History   Socioeconomic History   Marital status: Married    Spouse name: Not on file   Number of children: Not on file   Years of education: Not on file   Highest education level: Not on file  Occupational History   Occupation: retired Ambulance person: Retired   Occupation: HR (part-time)    Employer: [employer]  Tobacco Use   Smoking status: Never   Smokeless tobacco: Never  Substance and Sexual Activity   Alcohol use: No   Drug use: No   Sexual activity: Not on file  Other Topics Concern   Not on file  Social History Narrative   Works part-time in [employer] for [employer] in [employer] in [place]      Married   Social Determinants of Health   Financial Resource Strain: Low Risk  Difficulty of Paying Living Expenses: Not hard at all  Food Insecurity: No Food Insecurity   Worried About Running Out of Food in the Last Year: Never true   Ran Out of Food in the Last Year: Never true  Transportation Needs: No Transportation Needs   Lack of Transportation (Medical): No   Lack of Transportation (Non-Medical): No  Physical Activity: Sufficiently Active   Days of Exercise per Week: 4 days   Minutes of Exercise per Session: 60 min  Stress: No Stress Concern Present   Feeling of Stress : Not at all  Social Connections: Not on file  Intimate Partner Violence: Not At Risk   Fear of Current or Ex-Partner: No   Emotionally Abused: No   Physically Abused: No   Sexually Abused: No   Review of Systems No N/V Eating  okay No diarrhea No loss of smell or taste    Objective:   Physical Exam Constitutional:      Appearance: Normal appearance.     Comments: Looks pretty good--rare cough  Pulmonary:     Effort: Pulmonary effort is normal. No respiratory distress.  Neurological:     Mental Status: She is alert.           Assessment & Plan:

## 2021-06-20 NOTE — Assessment & Plan Note (Signed)
Started 3 days ago--and mostly just like "a head cold" She looks good She has had 2 boosters Discussed pros/cons of antiviral Rx---we will hold off If she worsens overnight---she will let me know and I will send in for a med (probably molnupiravir with her meds) Supportive care Works part time---okay to go back after 5 days with mask if symptoms are gone

## 2021-06-22 ENCOUNTER — Telehealth: Payer: Self-pay | Admitting: Primary Care

## 2021-06-22 MED ORDER — MOLNUPIRAVIR 200 MG PO CAPS: 4.0000 | ORAL_CAPSULE | Freq: Two times a day (BID) | ORAL | 0 refills | Status: DC

## 2021-06-22 NOTE — Telephone Encounter (Signed)
Sending access nurse note to Dr Evette Cristal NP as PCP and Carollee Herter CMA.

## 2021-06-22 NOTE — Telephone Encounter (Signed)
Already sent Access nurse note thru portal to Dr Alphonsus Sias and he has responded in 06/21/21 pt message note.

## 2021-06-22 NOTE — Telephone Encounter (Signed)
[facility] Night - Client TELEPHONE ADVICE RECORD AccessNurse Patient Name: Heather [name] Gender: Female DOB: 11-14-1955 Age: 65 Y 1 M 5 D Return Phone Number: [phone] (Primary) Address: City/ State/ Zip: [place] [address] Client [facility] Night - Client Client Site [facility] - Night Physician Heather [name] - NP Contact Type Call Who Is Calling Patient / Member / Family / Caregiver Call Type Triage / Clinical Relationship To Patient Self Return Phone Number [phone] (Primary) Chief Complaint BREATHING - shortness of breath or sounds breathless Reason for Call Symptomatic / Request for Health Information Initial Comment Caller states that she called Monday and had a virtual appointment with Dr. Alphonsus [name] because she was COVID positive. He wanted her to wait to get back with him if her symptoms got worse before prescribing any medication. She has worsened. She has sore throat, headache, shortness of breath due to chest congestion. Her regular provider is [name], NP. Translation No Nurse Assessment Nurse: Heather Gaul, RN, Heather [name] Date/Time (Eastern Time): 06/22/2021 7:24:44 AM Confirm and document reason for call. If symptomatic, describe symptoms. ---Caller states she had virtual appt with Dr. Alphonsus [name] as she had positive COVID test on Monday. Having some SOB. no fever. chest congestion. has sore throat. has a lot of nasal congestion. Does the patient have any new or worsening symptoms? ---Yes Will a triage be completed? ---Yes Related visit to physician within the last 2 weeks? ---Yes Does the PT have any chronic conditions? (i.e. diabetes, asthma, this includes High risk factors for pregnancy, etc.) ---Yes List chronic conditions. ---HTN Is this a behavioral health or substance abuse call? ---No Guidelines Guideline Title Affirmed Question Affirmed Notes Nurse Date/Time  (Eastern Time) COVID-19 - Diagnosed or Suspected MODERATE difficulty breathing (e.g., speaks in [place], [place], Heather [name] 06/22/2021 7:27:34 AM PLEASE NOTE: All timestamps contained within this report are represented as [place] Standard Time. CONFIDENTIALTY NOTICE: This fax transmission is intended only for the addressee. It contains information that is legally privileged, confidential or otherwise protected from use or disclosure. If you are not the intended recipient, you are strictly prohibited from reviewing, disclosing, copying using or disseminating any of this information or taking any action in reliance on or regarding this information. If you have received this fax in error, please notify us immediately by telephone so that we can arrange for its return to [place]. Phone: [phone], Toll-Free: [phone], Fax: [phone] Page: 2 of 2 Call Id: [id number] Guidelines Guideline Title Affirmed Question Affirmed Notes Nurse Date/Time Heather [name] Time) phrases, SOB even at rest, pulse 100-120) Disp. Time Heather [name] Time) Disposition Final User 06/22/2021 7:20:54 AM Send to Urgent Queue Heather [name] 06/22/2021 7:32:16 AM Go to ED Now Yes Heather Gaul, RN, Heather [name] Disagree/Comply Disagree Caller Understands Yes PreDisposition Home Care Care Advice Given Per Guideline GO TO ED NOW: * You need to be seen in the Emergency Department. CALL EMS 911 IF: * Severe difficulty breathing occurs * Lips or face turns blue Comments User: Heather Buff, RN Date/Time (Eastern Time): 06/22/2021 7:32:01 AM Pt does not want to go to the ER as she just got an email saying that Dr. Alphonsus [name] has sent in the prescription that he talked to her about. She is going to try that first Referrals GO TO FACILITY REFUSED

## 2021-07-11 ENCOUNTER — Encounter (HOSPITAL_BASED_OUTPATIENT_CLINIC_OR_DEPARTMENT_OTHER): Payer: Self-pay

## 2021-07-11 ENCOUNTER — Telehealth: Payer: Self-pay | Admitting: *Deleted

## 2021-07-11 ENCOUNTER — Emergency Department (HOSPITAL_BASED_OUTPATIENT_CLINIC_OR_DEPARTMENT_OTHER)
Admission: EM | Admit: 2021-07-11 | Discharge: 2021-07-11 | Disposition: A | Discharge status: Home / Self Care | Payer: Medicare PPO | Attending: Student | Admitting: Student

## 2021-07-11 ENCOUNTER — Other Ambulatory Visit: Payer: Self-pay

## 2021-07-11 ENCOUNTER — Emergency Department (HOSPITAL_BASED_OUTPATIENT_CLINIC_OR_DEPARTMENT_OTHER): Payer: Medicare PPO | Admitting: Radiology

## 2021-07-11 DIAGNOSIS — R059 Cough, unspecified: Secondary | ICD-10-CM | POA: Diagnosis not present

## 2021-07-11 DIAGNOSIS — Z20822 Contact with and (suspected) exposure to covid-19: Secondary | ICD-10-CM | POA: Diagnosis not present

## 2021-07-11 DIAGNOSIS — J111 Influenza due to unidentified influenza virus with other respiratory manifestations: Secondary | ICD-10-CM | POA: Diagnosis not present

## 2021-07-11 DIAGNOSIS — R000 Tachycardia, unspecified: Secondary | ICD-10-CM | POA: Insufficient documentation

## 2021-07-11 DIAGNOSIS — I129 Hypertensive chronic kidney disease with stage 1 through stage 4 chronic kidney disease, or unspecified chronic kidney disease: Secondary | ICD-10-CM | POA: Insufficient documentation

## 2021-07-11 DIAGNOSIS — Z8616 Personal history of COVID-19: Secondary | ICD-10-CM | POA: Diagnosis not present

## 2021-07-11 DIAGNOSIS — N189 Chronic kidney disease, unspecified: Secondary | ICD-10-CM | POA: Diagnosis not present

## 2021-07-11 DIAGNOSIS — Z79899 Other long term (current) drug therapy: Secondary | ICD-10-CM | POA: Diagnosis not present

## 2021-07-11 DIAGNOSIS — J9811 Atelectasis: Secondary | ICD-10-CM | POA: Diagnosis not present

## 2021-07-11 LAB — CBC WITH DIFFERENTIAL/PLATELET
Abs Immature Granulocytes: 0.01 K/uL (ref 0.00–0.07)
Basophils Absolute: 0 K/uL (ref 0.0–0.1)
Basophils Relative: 1 %
Eosinophils Absolute: 0.1 K/uL (ref 0.0–0.5)
Eosinophils Relative: 1 %
HCT: 35.1 % — ABNORMAL LOW (ref 36.0–46.0)
Hemoglobin: 11.8 g/dL — ABNORMAL LOW (ref 12.0–15.0)
Immature Granulocytes: 0 %
Lymphocytes Relative: 7 %
Lymphs Abs: 0.3 K/uL — ABNORMAL LOW (ref 0.7–4.0)
MCH: 30.2 pg (ref 26.0–34.0)
MCHC: 33.6 g/dL (ref 30.0–36.0)
MCV: 89.8 fL (ref 80.0–100.0)
Monocytes Absolute: 0.4 K/uL (ref 0.1–1.0)
Monocytes Relative: 9 %
Neutro Abs: 3.6 K/uL (ref 1.7–7.7)
Neutrophils Relative %: 82 %
Platelets: 221 K/uL (ref 150–400)
RBC: 3.91 MIL/uL (ref 3.87–5.11)
RDW: 13.1 % (ref 11.5–15.5)
WBC: 4.3 K/uL (ref 4.0–10.5)
nRBC: 0 % (ref 0.0–0.2)

## 2021-07-11 LAB — TROPONIN I (HIGH SENSITIVITY): Troponin I (High Sensitivity): 3 ng/L

## 2021-07-11 LAB — RESP PANEL BY RT-PCR (FLU A&B, COVID) ARPGX2
Influenza A by PCR: POSITIVE — AB
Influenza B by PCR: NEGATIVE
SARS Coronavirus 2 by RT PCR: NEGATIVE

## 2021-07-11 LAB — COMPREHENSIVE METABOLIC PANEL
ALT: 45 U/L — ABNORMAL HIGH (ref 0–44)
AST: 46 U/L — ABNORMAL HIGH (ref 15–41)
Albumin: 4.4 g/dL (ref 3.5–5.0)
Alkaline Phosphatase: 91 U/L (ref 38–126)
Anion gap: 10 (ref 5–15)
BUN: 11 mg/dL (ref 8–23)
CO2: 26 mmol/L (ref 22–32)
Calcium: 9.2 mg/dL (ref 8.9–10.3)
Chloride: 105 mmol/L (ref 98–111)
Creatinine, Ser: 1 mg/dL (ref 0.44–1.00)
GFR, Estimated: >60 mL/min (ref >60)
Glucose, Bld: 97 mg/dL (ref 70–99)
Potassium: 3.5 mmol/L (ref 3.5–5.1)
Sodium: 141 mmol/L (ref 135–145)
Total Bilirubin: 0.9 mg/dL (ref 0.3–1.2)
Total Protein: 7.5 g/dL (ref 6.5–8.1)

## 2021-07-11 MED ORDER — KETOROLAC TROMETHAMINE 15 MG/ML IJ SOLN
15.0000 mg | Freq: Once | INTRAMUSCULAR | Status: AC
  Administered 2021-07-11: 15 mg via INTRAVENOUS
  Filled 2021-07-11: qty 1

## 2021-07-11 MED ORDER — BENZONATATE 100 MG PO CAPS: 100.0000 mg | ORAL_CAPSULE | Freq: Three times a day (TID) | ORAL | 0 refills | Status: DC

## 2021-07-11 MED ORDER — DIPHENHYDRAMINE HCL 50 MG/ML IJ SOLN
25.0000 mg | Freq: Once | INTRAMUSCULAR | Status: AC
  Administered 2021-07-11: 25 mg via INTRAVENOUS
  Filled 2021-07-11: qty 1

## 2021-07-11 MED ORDER — OSELTAMIVIR PHOSPHATE 75 MG PO CAPS: 75.0000 mg | ORAL_CAPSULE | Freq: Two times a day (BID) | ORAL | 0 refills | Status: DC

## 2021-07-11 MED ORDER — PROCHLORPERAZINE EDISYLATE 10 MG/2ML IJ SOLN
10.0000 mg | Freq: Once | INTRAMUSCULAR | Status: AC
  Administered 2021-07-11: 10 mg via INTRAVENOUS
  Filled 2021-07-11: qty 2

## 2021-07-11 NOTE — Telephone Encounter (Signed)
Noted, glad pt is getting evaluated

## 2021-07-11 NOTE — ED Triage Notes (Signed)
Pt arrives POV, states she tested positive for Covid on 10/2.  Since then, she has had a cough that will not resolve.

## 2021-07-11 NOTE — Telephone Encounter (Signed)
Patient went to ED, see notes

## 2021-07-11 NOTE — Telephone Encounter (Signed)
PLEASE NOTE: All timestamps contained within this report are represented as [place] Standard Time. CONFIDENTIALTY NOTICE: This fax transmission is intended only for the addressee. It contains information that is legally privileged, confidential or otherwise protected from use or disclosure. If you are not the intended recipient, you are strictly prohibited from reviewing, disclosing, copying using or disseminating any of this information or taking any action in reliance on or regarding this information. If you have received this fax in error, please notify us immediately by telephone so that we can arrange for its return to [place]. Phone: [phone], Toll-Free: [phone], Fax: [phone] Page: 1 of 2 Call Id: [id number] [facility] Night - Client TELEPHONE ADVICE RECORD AccessNurse Patient Name: Heather [name] Gender: Female DOB: 1956/01/01 Age: 65 Y 1 M 24 D Return Phone Number: [phone] (Primary) Address: City/ State/ Zip: [place]  [address] Client [facility] Night - Client Client Site [facility] - Night Physician Vernona Rieger - NP Contact Type Call Who Is Calling Patient / Member / Family / Caregiver Call Type Triage / Clinical Relationship To Patient Self Return Phone Number [phone] (Primary) Chief Complaint BREATHING - shortness of breath or sounds breathless Reason for Call Request to Schedule Office Appointment Initial Comment Caller has sxs - Asks to be seen via virtual appt. Tested Covid + 3 wks ago and began Rx. Sxs improved then returned SAT. Cough had lingered but gotten better but now back and much worse - audibly deep, dry cough, which makes her SOB when it comes in fits and couldn't sleep last night. Low fever. Fully vaxxed/boosted. Translation No Nurse Assessment Nurse: Alexander Mt, RN, Nicholaus Bloom Date/Time (Eastern Time): 07/11/2021 7:56:04 AM Confirm and document reason for  call. If symptomatic, describe symptoms. ---Caller states she Tested Covid + 3 wks ago and began Rx. States the cough is much worse now. states she gets short of breath with coughing spells. States she is still running a low grade fever....last was 99.8. She is taking tylenol and cough medicine. Does the patient have any new or worsening symptoms? ---Yes Will a triage be completed? ---Yes Related visit to physician within the last 2 weeks? ---No Does the PT have any chronic conditions? (i.e. diabetes, asthma, this includes High risk factors for pregnancy, etc.) ---Yes List chronic conditions. ---HTN, cholesterol, depression Is this a behavioral health or substance abuse call? ---No Guidelines Guideline Title Affirmed Question Affirmed Notes Nurse Date/Time (Eastern Time) COVID-19 - Persisting Symptoms Follow-up Call [1] MODERATE difficulty breathing AND [2] oxygen level (e.g., pulse [place], RN, Nicholaus Bloom 07/11/2021 7:59:06 AM PLEASE NOTE: All timestamps contained within this report are represented as [place] Standard Time. CONFIDENTIALTY NOTICE: This fax transmission is intended only for the addressee. It contains information that is legally privileged, confidential or otherwise protected from use or disclosure. If you are not the intended recipient, you are strictly prohibited from reviewing, disclosing, copying using or disseminating any of this information or taking any action in reliance on or regarding this information. If you have received this fax in error, please notify us immediately by telephone so that we can arrange for its return to [place]. Phone: [phone], Toll-Free: [phone], Fax: [phone] Page: 2 of 2 Call Id: [id number] Guidelines Guideline Title Affirmed Question Affirmed Notes Nurse Date/Time Lamount Cohen Time) oximetry) 91 to 94 percent Disp. Time Lamount Cohen Time) Disposition Final User 07/11/2021 7:54:52 AM Send to Urgent Yates Decamp, Erin 07/11/2021  8:04:21 AM Go to ED Now Yes Alexander Mt, RN,  Prentiss Bells Disagree/Comply Comply Caller Understands Yes PreDisposition Go to Urgent Care/Walk-In Clinic Care Advice Given Per Guideline GO TO ED NOW: * You need to be seen in the Emergency Department. * Go to the ED at ___________ Hospital. * Leave now. Drive carefully. ANOTHER ADULT SHOULD DRIVE: * It is better and safer if another adult drives instead of you. CARE ADVICE given per COVID-19 - Persisting Symptoms Follow-Up Call (Adult) guideline. Referrals GO TO FACILITY UNDECIDED

## 2021-07-11 NOTE — ED Provider Notes (Signed)
[facility] EMERGENCY DEPT Provider Note   CSN: [id number] Arrival date & time: 07/11/21  0911     History Chief Complaint  Patient presents with   Cough    Heather [name] is a 65 y.o. female who presents to the emergency department for evaluation of a cough.  Patient states that she tested positive for COVID on 06/19/2021 and was given molnupiravir at that time.  She states that she started to improve, but yesterday, had a return of her chills and cough.  She denies chest pain, shortness of breath, abdominal pain, nausea, vomiting, diarrhea or other systemic symptoms.  Patient arrives febrile to 100.6 and tachycardic to 105.   Cough Associated symptoms: chills and fever   Associated symptoms: no chest pain, no ear pain, no rash, no shortness of breath and no sore throat       Past Medical History:  Diagnosis Date   B12 deficiency    Chronic kidney disease    Depression    GERD (gastroesophageal reflux disease)    Hyperlipidemia    Hypertension    had ankle swelling with amlodipine   Infection of urinary tract 12/16/2014   RLS (restless legs syndrome)     Patient Active Problem List   Diagnosis Date Noted   COVID-19 virus infection 06/20/2021   Dog bite 05/03/2021   Cerumen impaction 01/05/2021   Urinary frequency 02/10/2020   Welcome to Medicare preventive visit 10/17/2019   Macular degeneration 10/11/2018   Preventative health care 10/11/2018   Hyperglycemia 10/11/2018   Liver mass 12/28/2015   Bowel incontinence 12/16/2014   HLD (hyperlipidemia) 08/24/2014   Depression 07/25/2012   Vaginal atrophy 01/13/2011   Chronic kidney disease    B12 deficiency    Essential hypertension 07/19/2010    Past Surgical History:  Procedure Laterality Date   CATARACT EXTRACTION, BILATERAL  2021   SALPINGOOPHORECTOMY     right; laproscopic     OB History   No obstetric history on file.     Family History  Problem Relation Age of Onset   Hypertension  Mother    Heart attack Mother 79       sudden cardiac death   COPD Father    Heart attack Father 56       developed ischemic cardiomyopathy    Social History   Tobacco Use   Smoking status: Never   Smokeless tobacco: Never  Vaping Use   Vaping Use: Never used  Substance Use Topics   Alcohol use: No   Drug use: No    Home Medications Prior to Admission medications   Medication Sig Start Date End Date Taking? Authorizing Provider  acetaminophen (TYLENOL) 325 MG tablet Take 650 mg by mouth every 6 (six) hours as needed.   Yes [provider]  atenolol (TENORMIN) 25 MG tablet Take 1 tablet (25 mg total) by mouth daily. For Blood pressure 01/05/21  Yes Doreene Nest, NP  benzonatate (TESSALON) 100 MG capsule Take 1 capsule (100 mg total) by mouth every 8 (eight) hours. 07/11/21  Yes Ionia Schey, MD  DULoxetine (CYMBALTA) 30 MG capsule Take 1 capsule (30 mg total) by mouth daily. For Anxiety and depression. 01/05/21  Yes Doreene Nest, NP  losartan (COZAAR) 50 MG tablet Take 1 tablet (50 mg total) by mouth daily. For blood pressure. 01/05/21  Yes Doreene Nest, NP  molnupiravir EUA (LAGEVRIO) 200 MG CAPS capsule Take 4 capsules (800 mg total) by mouth in the morning and  at bedtime. 06/22/21  Yes Karie Schwalbe, MD  omeprazole (PRILOSEC) 20 MG capsule Take 1 capsule (20 mg total) by mouth 2 (two) times daily. 06/01/15  Yes Dianne Dun, MD  oseltamivir (TAMIFLU) 75 MG capsule Take 1 capsule (75 mg total) by mouth every 12 (twelve) hours. 07/11/21  Yes Silvanna Ohmer, MD  simvastatin (ZOCOR) 40 MG tablet TAKE 1 TABLET BY MOUTH AT BEDTIME FOR CHOLESTEROL 01/05/21  Yes Doreene Nest, NP    Allergies    Augmentin [amoxicillin-pot clavulanate]  Review of Systems   Review of Systems  Constitutional:  Positive for chills and fever.  HENT:  Negative for ear pain and sore throat.   Eyes:  Negative for pain and visual disturbance.  Respiratory:  Positive for  cough. Negative for shortness of breath.   Cardiovascular:  Negative for chest pain and palpitations.  Gastrointestinal:  Negative for abdominal pain and vomiting.  Genitourinary:  Negative for dysuria and hematuria.  Musculoskeletal:  Negative for arthralgias and back pain.  Skin:  Negative for color change and rash.  Neurological:  Negative for seizures and syncope.  All other systems reviewed and are negative.  Physical Exam Updated Vital Signs BP (!) 147/75 (BP Location: Left Arm)   Pulse 86   Temp 100.3 F (37.9 C) (Oral)   Resp 15   Ht 5\' 6"  (1.676 m)   Wt 68 kg   SpO2 95%   BMI 24.21 kg/m   Physical Exam Vitals and nursing note reviewed.  Constitutional:      General: She is not in acute distress.    Appearance: She is well-developed.  HENT:     Head: Normocephalic and atraumatic.  Eyes:     Conjunctiva/sclera: Conjunctivae normal.  Cardiovascular:     Rate and Rhythm: Normal rate and regular rhythm.     Heart sounds: No murmur heard. Pulmonary:     Effort: Pulmonary effort is normal. No respiratory distress.     Breath sounds: Normal breath sounds.  Abdominal:     Palpations: Abdomen is soft.     Tenderness: There is no abdominal tenderness.  Musculoskeletal:     Cervical back: Neck supple.  Skin:    General: Skin is warm and dry.  Neurological:     Mental Status: She is alert.    ED Results / Procedures / Treatments   Labs (all labs ordered are listed, but only abnormal results are displayed) Labs Reviewed  RESP PANEL BY RT-PCR (FLU A&B, COVID) ARPGX2 - Abnormal; Notable for the following components:      Result Value   Influenza A by PCR POSITIVE (*)    All other components within normal limits  COMPREHENSIVE METABOLIC PANEL - Abnormal; Notable for the following components:   AST 46 (*)    ALT 45 (*)    All other components within normal limits  CBC WITH DIFFERENTIAL/PLATELET - Abnormal; Notable for the following components:   Hemoglobin 11.8 (*)     HCT 35.1 (*)    Lymphs Abs 0.3 (*)    All other components within normal limits  TROPONIN I (HIGH SENSITIVITY)    EKG None  Radiology DG Chest 2 View  Result Date: 07/11/2021 CLINICAL DATA:  Non resolving cough since COVID diagnosis 06/19/2021 question pneumonia EXAM: CHEST - 2 VIEW COMPARISON:  None FINDINGS: Normal heart size, mediastinal contours, and pulmonary vascularity. Slight elevation of RIGHT diaphragm with minimal RIGHT basilar atelectasis. Mild peribronchial thickening. No pulmonary infiltrate, pleural effusion, or pneumothorax.  Osseous structures unremarkable. IMPRESSION: Bronchitic changes with minimal RIGHT basilar atelectasis. Electronically Signed   By: Ulyses Southward M.D.   On: 07/11/2021 10:06    Procedures Procedures   Medications Ordered in ED Medications  ketorolac (TORADOL) 15 MG/ML injection 15 mg (15 mg Intravenous Given 07/11/21 1026)  prochlorperazine (COMPAZINE) injection 10 mg (10 mg Intravenous Given 07/11/21 1058)  diphenhydrAMINE (BENADRYL) injection 25 mg (25 mg Intravenous Given 07/11/21 1058)    ED Course  I have reviewed the triage vital signs and the nursing notes.  Pertinent labs & imaging results that were available during my care of the patient were reviewed by me and considered in my medical decision making (see chart for details).    MDM Rules/Calculators/A&P                           Patient seen emergency department for evaluation of a cough.  Unremarkable.  Laboratory evaluation with a hemoglobin 11.8, mild AST elevation of 46, ALT elevation to 45.  Troponin negative.  Patient positive for influenza.  Patient presentation is consistent with a viral URI secondary to influenza and she was given a prescription for [facility] and as she is within the window for Tamiflu this prescription was sent to her pharmacy.  Patient given return precautions of which she voiced understanding and she was discharged. Final Clinical Impression(s) /  ED Diagnoses Final diagnoses:  Influenza    Rx / DC Orders ED Discharge Orders          Ordered    benzonatate (TESSALON) 100 MG capsule  Every 8 hours        07/11/21 1128    oseltamivir (TAMIFLU) 75 MG capsule  Every 12 hours        07/11/21 1129             Branna Cortina, Wyn Forster, MD 07/11/21 1553

## 2021-07-14 ENCOUNTER — Ambulatory Visit: Payer: Medicare PPO

## 2021-07-21 ENCOUNTER — Other Ambulatory Visit: Payer: Self-pay

## 2021-07-21 ENCOUNTER — Ambulatory Visit (INDEPENDENT_AMBULATORY_CARE_PROVIDER_SITE_OTHER): Payer: Medicare PPO

## 2021-07-21 DIAGNOSIS — E538 Deficiency of other specified B group vitamins: Secondary | ICD-10-CM | POA: Diagnosis not present

## 2021-07-21 MED ORDER — CYANOCOBALAMIN 1000 MCG/ML IJ SOLN
1000.0000 ug | Freq: Once | INTRAMUSCULAR | Status: AC
  Administered 2021-07-21: 1000 ug via INTRAMUSCULAR

## 2021-07-21 NOTE — Progress Notes (Signed)
Per orders of Mayra Reel, NP, injection of monthly B12 1000 mcg/ml given by Eual Fines, CMA in Right Deltoid. Patient tolerated injection well.

## 2021-08-09 DIAGNOSIS — Z86010 Personal history of colonic polyps: Secondary | ICD-10-CM | POA: Diagnosis not present

## 2021-08-09 DIAGNOSIS — Z1211 Encounter for screening for malignant neoplasm of colon: Secondary | ICD-10-CM | POA: Diagnosis not present

## 2021-08-09 LAB — HM COLONOSCOPY

## 2021-08-10 ENCOUNTER — Encounter: Payer: Self-pay | Admitting: Primary Care

## 2021-08-22 ENCOUNTER — Other Ambulatory Visit: Payer: Medicare PPO

## 2021-08-23 ENCOUNTER — Telehealth: Payer: Self-pay | Admitting: Primary Care

## 2021-08-23 DIAGNOSIS — E538 Deficiency of other specified B group vitamins: Secondary | ICD-10-CM

## 2021-08-23 NOTE — Telephone Encounter (Signed)
Patient has a lab appt 08/25/2021, there are no orders in. 

## 2021-08-23 NOTE — Addendum Note (Signed)
Addended by: Doreene Nest on: 08/23/2021 06:51 PM   Modules accepted: Orders

## 2021-08-23 NOTE — Telephone Encounter (Signed)
Orders placed.

## 2021-08-25 ENCOUNTER — Other Ambulatory Visit (INDEPENDENT_AMBULATORY_CARE_PROVIDER_SITE_OTHER): Payer: Medicare PPO

## 2021-08-25 ENCOUNTER — Ambulatory Visit (INDEPENDENT_AMBULATORY_CARE_PROVIDER_SITE_OTHER): Payer: Medicare PPO

## 2021-08-25 ENCOUNTER — Other Ambulatory Visit: Payer: Self-pay

## 2021-08-25 DIAGNOSIS — E538 Deficiency of other specified B group vitamins: Secondary | ICD-10-CM | POA: Diagnosis not present

## 2021-08-25 LAB — VITAMIN B12: Vitamin B-12: 343 pg/mL (ref 211–911)

## 2021-08-25 MED ORDER — CYANOCOBALAMIN 1000 MCG/ML IJ SOLN
1000.0000 ug | Freq: Once | INTRAMUSCULAR | Status: AC
  Administered 2021-08-25: 1000 ug via INTRAMUSCULAR

## 2021-08-25 NOTE — Progress Notes (Signed)
Per orders of Mayra Reel, NP, injection of monthly B12 1000 mcg/ml injection given by Eual Fines, CMA in Left Deltoid. Patient tolerated injection well.  She had her B12 labs done prior to injection.

## 2021-08-26 NOTE — Telephone Encounter (Signed)
Called patient made injection for next month. Will make future appointment when she comes in for that.

## 2021-08-26 NOTE — Telephone Encounter (Signed)
Left message to return call to our office.  

## 2021-08-26 NOTE — Telephone Encounter (Signed)
Patient needs subsequent monthly B12 injections scheduled through April 2023

## 2021-08-29 ENCOUNTER — Other Ambulatory Visit: Payer: Self-pay

## 2021-08-29 ENCOUNTER — Ambulatory Visit: Payer: Medicare PPO | Attending: Internal Medicine

## 2021-08-29 DIAGNOSIS — Z23 Encounter for immunization: Secondary | ICD-10-CM

## 2021-08-29 NOTE — Progress Notes (Signed)
   Covid-19 Vaccination Clinic  Name:  MYISHA PICKEREL    MRN: [id number] DOB: 07-09-1956  08/29/2021  Ms. Urquilla was observed post Covid-19 immunization for 15 minutes without incident. She was provided with Vaccine Information Sheet and instruction to access the V-Safe system.   Ms. Colombo was instructed to call 911 with any severe reactions post vaccine: Difficulty breathing  Swelling of face and throat  A fast heartbeat  A bad rash all over body  Dizziness and weakness   Immunizations Administered     Name Date Dose VIS Date Route   Pfizer Covid-19 Vaccine Bivalent Booster 08/29/2021 10:34 AM 0.3 mL 05/18/2021 Intramuscular   Manufacturer: [employer], [employer]   Lot: [id number]   NDC: [phone]

## 2021-09-27 ENCOUNTER — Ambulatory Visit (INDEPENDENT_AMBULATORY_CARE_PROVIDER_SITE_OTHER): Payer: Medicare PPO

## 2021-09-27 ENCOUNTER — Other Ambulatory Visit: Payer: Self-pay

## 2021-09-27 DIAGNOSIS — E538 Deficiency of other specified B group vitamins: Secondary | ICD-10-CM

## 2021-09-27 MED ORDER — CYANOCOBALAMIN 1000 MCG/ML IJ SOLN
1000.0000 ug | Freq: Once | INTRAMUSCULAR | Status: AC
  Administered 2021-09-27: 1000 ug via INTRAMUSCULAR

## 2021-09-27 NOTE — Progress Notes (Signed)
Per orders of Kate Clark, NP, injection of monthly B12 1000 mcg/ml given by Skyelyn Scruggs P Judieth Mckown, CMA in Right Deltoid. Patient tolerated injection well.  

## 2021-10-31 DIAGNOSIS — H524 Presbyopia: Secondary | ICD-10-CM | POA: Diagnosis not present

## 2021-11-15 ENCOUNTER — Ambulatory Visit (INDEPENDENT_AMBULATORY_CARE_PROVIDER_SITE_OTHER): Payer: Medicare PPO

## 2021-11-15 ENCOUNTER — Other Ambulatory Visit: Payer: Self-pay

## 2021-11-15 DIAGNOSIS — E538 Deficiency of other specified B group vitamins: Secondary | ICD-10-CM

## 2021-11-15 MED ORDER — CYANOCOBALAMIN 1000 MCG/ML IJ SOLN
1000.0000 ug | Freq: Once | INTRAMUSCULAR | Status: AC
Start: 2021-11-15 — End: 2021-11-15
  Administered 2021-11-15: 1000 ug via INTRAMUSCULAR

## 2021-11-15 NOTE — Progress Notes (Signed)
Per orders of Mayra Reel, NP, injection of B12 1000 mcg/ml given by Eual Fines, CMA in Left Deltoid. Patient tolerated injection well.

## 2021-11-22 DIAGNOSIS — H353112 Nonexudative age-related macular degeneration, right eye, intermediate dry stage: Secondary | ICD-10-CM | POA: Diagnosis not present

## 2021-11-22 DIAGNOSIS — H353123 Nonexudative age-related macular degeneration, left eye, advanced atrophic without subfoveal involvement: Secondary | ICD-10-CM | POA: Diagnosis not present

## 2021-12-14 ENCOUNTER — Ambulatory Visit (INDEPENDENT_AMBULATORY_CARE_PROVIDER_SITE_OTHER): Payer: Medicare PPO

## 2021-12-14 ENCOUNTER — Telehealth: Payer: Self-pay | Admitting: Primary Care

## 2021-12-14 ENCOUNTER — Telehealth: Payer: Self-pay

## 2021-12-14 ENCOUNTER — Other Ambulatory Visit: Payer: Self-pay

## 2021-12-14 DIAGNOSIS — E538 Deficiency of other specified B group vitamins: Secondary | ICD-10-CM

## 2021-12-14 MED ORDER — CYANOCOBALAMIN 1000 MCG/ML IJ SOLN
1000.0000 ug | Freq: Once | INTRAMUSCULAR | Status: AC
  Administered 2021-12-14: 1000 ug via INTRAMUSCULAR

## 2021-12-14 NOTE — Telephone Encounter (Signed)
Called and spoke to pt to schedule her CPE in May. ?

## 2021-12-14 NOTE — Telephone Encounter (Signed)
Left message to return call to our office.  

## 2021-12-14 NOTE — Progress Notes (Signed)
Patient presented for B 12 injection given by Olu[place]wa Kinyon, CMA to right deltoid, patient voiced no concerns nor showed any signs of distress during injection.  

## 2021-12-14 NOTE — Telephone Encounter (Signed)
Patient will be due for CPE/follow-up in late April or early May 2023. ?Please schedule. ?

## 2022-01-05 ENCOUNTER — Ambulatory Visit (INDEPENDENT_AMBULATORY_CARE_PROVIDER_SITE_OTHER): Payer: Medicare PPO

## 2022-01-05 DIAGNOSIS — Z1231 Encounter for screening mammogram for malignant neoplasm of breast: Secondary | ICD-10-CM | POA: Diagnosis not present

## 2022-01-05 DIAGNOSIS — Z0000 Encounter for general adult medical examination without abnormal findings: Secondary | ICD-10-CM | POA: Diagnosis not present

## 2022-01-05 DIAGNOSIS — Z780 Asymptomatic menopausal state: Secondary | ICD-10-CM

## 2022-01-05 NOTE — Progress Notes (Signed)
? ?Subjective:  ? Heather Heather [name] is a 66 y.o. female who presents for Medicare Annual (Subsequent) preventive examination. ? ?Virtual Visit via Telephone Note ? ?I connected with  Heather Heather [name] on 01/05/22 at 10:45 AM EDT by telephone and verified that I am speaking with the correct person using two identifiers. ? ?Location: ?Patient: home ?Provider: [facility] ?Persons participating in the virtual visit: patient/Nurse Health Advisor ?  ?I discussed the limitations, risks, security and privacy concerns of performing an evaluation and management service by telephone and the availability of in person appointments. The patient expressed understanding and agreed to proceed. ? ?Interactive audio and video telecommunications were attempted between this nurse and patient, however failed, due to patient having technical difficulties OR patient did not have access to video capability.  We continued and completed visit with audio only. ? ?Some vital signs may be absent or patient reported.  ? ?Heather Littler, LPN ? ? ?Review of Systems    ? ?Cardiac Risk Factors include: advanced age (>12men, >64 women);hypertension ? ?   ?Objective:  ?  ?There were Heather [name] vitals filed for this visit. ?There is Heather [name] height or weight on file to calculate BMI. ? ? ?  01/05/2022  ? 10:49 AM 07/11/2021  ?  9:23 AM 01/04/2021  ? 10:25 AM  ?Advanced Directives  ?Does Patient Have a Medical Advance Directive? Heather [name] Heather [name] Heather [name]  ?Would patient like information on creating a medical advance directive? Yes (MAU/Ambulatory/Procedural Areas - Information given)  Heather [name] - Patient declined  ? ? ?Current Medications (verified) ?Outpatient Encounter Medications as of 01/05/2022  ?Medication Sig  ? acetaminophen (TYLENOL) 325 MG tablet Take 650 mg by mouth every 6 (six) hours as needed.  ? atenolol (TENORMIN) 25 MG tablet Take 1 tablet (25 mg total) by mouth daily. For Blood pressure  ? DULoxetine (CYMBALTA) 30 MG capsule Take 1 capsule (30 mg total) by mouth daily. For Anxiety and  depression.  ? losartan (COZAAR) 50 MG tablet Take 1 tablet (50 mg total) by mouth daily. For blood pressure.  ? omeprazole (PRILOSEC) 20 MG capsule Take 1 capsule (20 mg total) by mouth 2 (two) times daily.  ? simvastatin (ZOCOR) 40 MG tablet TAKE 1 TABLET BY MOUTH AT BEDTIME FOR CHOLESTEROL  ? [DISCONTINUED] benzonatate (TESSALON) 100 MG capsule Take 1 capsule (100 mg total) by mouth every 8 (eight) hours.  ? [DISCONTINUED] molnupiravir EUA (LAGEVRIO) 200 MG CAPS capsule Take 4 capsules (800 mg total) by mouth in the morning and at bedtime.  ? [DISCONTINUED] oseltamivir (TAMIFLU) 75 MG capsule Take 1 capsule (75 mg total) by mouth every 12 (twelve) hours.  ? ?Facility-Administered Encounter Medications as of 01/05/2022  ?Medication  ? cyanocobalamin ((VITAMIN B-12)) injection 1,000 mcg  ? ? ?Allergies (verified) ?Amoxicillin and Augmentin [amoxicillin-pot clavulanate]  ? ?History: ?Past Medical History:  ?Diagnosis Date  ? B12 deficiency   ? Chronic kidney disease   ? Depression   ? GERD (gastroesophageal reflux disease)   ? Hyperlipidemia   ? Hypertension   ? had ankle swelling with amlodipine  ? Infection of urinary tract 12/16/2014  ? RLS (restless legs syndrome)   ? ?Past Surgical History:  ?Procedure Laterality Date  ? CATARACT EXTRACTION, BILATERAL  2021  ? SALPINGOOPHORECTOMY    ? right; laproscopic  ? ?Family History  ?Problem Relation Age of Onset  ? Hypertension Mother   ? Heart attack Mother 58  ?     sudden cardiac death  ? COPD Father   ?  Heart attack Father 46  ?     developed ischemic cardiomyopathy  ? ?Social History  ? ?Socioeconomic History  ? Marital status: Married  ?  Spouse name: Not on file  ? Number of children: 1  ? Years of education: Not on file  ? Highest education level: Not on file  ?Occupational History  ? Occupation: retired school principal  ?  Associate Professor: Retired  ? Occupation: HR (part-time)  ?  Employer: [employer]  ?Tobacco Use  ? Smoking status: Never  ? Smokeless tobacco: Never   ?Vaping Use  ? Vaping Use: Never used  ?Substance and Sexual Activity  ? Alcohol use: Heather [name]  ? Drug use: Heather [name]  ? Sexual activity: Not on file  ?Other Topics Concern  ? Not on file  ?Social History Narrative  ? Works part-time in [employer] for [employer] in [employer] in [place] Married  ? ?Social Determinants of Health  ? ?Financial Resource Strain: Low Risk   ? Difficulty of Paying Living Expenses: Not hard at all  ?Food Insecurity: Heather [name] Food Insecurity  ? Worried About Programme researcher, broadcasting/film/video in the Last Year: Never true  ? Ran Out of Food in the Last Year: Never true  ?Transportation Needs: Heather [name] Transportation Needs  ? Lack of Transportation (Medical): Heather [name]  ? Lack of Transportation (Non-Medical): Heather [name]  ?Physical Activity: Sufficiently Active  ? Days of Exercise per Week: 4 days  ? Minutes of Exercise per Session: 50 min  ?Stress: Heather [name] Stress Concern Present  ? Feeling of Stress : Only a little  ?Social Connections: Moderately Integrated  ? Frequency of Communication with Friends and Family: More than three times a week  ? Frequency of Social Gatherings with Friends and Family: Three times a week  ? Attends Religious Services: More than 4 times per year  ? Active Member of Clubs or Organizations: Heather [name]  ? Attends Banker Meetings: Never  ? Marital Status: Married  ? ? ?Tobacco Counseling ?Counseling given: Not Answered ? ? ?Clinical Intake: ? ?Pre-visit preparation completed: Yes ? ?Pain : Heather [name]/denies pain ? ?  ? ?Nutritional Risks: None ?Diabetes: Heather [name] ? ?How often do you need to have someone help you when you read instructions, pamphlets, or other written materials from your doctor or pharmacy?: 1 - Never ? ? ? ?Interpreter Needed?: Heather [name] ? ?Information entered by :: Heather Littler LPN ? ? ?Activities of Daily Living ? ?  01/05/2022  ? 10:50 AM  ?In your present state of health, do you have any difficulty performing the following activities:  ?Hearing? 0  ?Vision? 0  ?Difficulty concentrating or making decisions? 0  ?Walking  or climbing stairs? 0  ?Dressing or bathing? 0  ?Doing errands, shopping? 0  ?Preparing Food and eating ? N  ?Using the Toilet? N  ?In the past six months, have you accidently leaked urine? N  ?Do you have problems with loss of bowel control? N  ?Managing your Medications? N  ?Managing your Finances? N  ?Housekeeping or managing your Housekeeping? N  ? ? ?Patient Care Team: ?Doreene Nest, NP as PCP - General (Internal Medicine) ?Sharrell Ku, MD as Consulting Physician (Gastroenterology) ? ?Indicate any recent Medical Services you may have received from other than [facility] providers in the past year (date may be approximate). ? ?   ?Assessment:  ? This is a routine wellness examination for Heather Heather [name]. ? ?Hearing/Vision screen ?Hearing Screening - Comments:: Pt denies hearing difficulty ?Vision Screening - Comments:: [facility] ? ?Dietary issues and exercise activities discussed: ?Current Exercise Habits: Home exercise routine, Type of exercise: walking, Time (Minutes): 45, Frequency (Times/Week): 4, Weekly Exercise (Minutes/Week): 180, Intensity: Mild, Exercise limited by: None identified ? ? Goals Addressed   ? ?  ?  ?  ?  ? This Visit's Progress  ?  Patient Stated   On track  ?  01/04/2021, I will continue to walk 4 days a week for about 2 miles.  ?  ? ?  ? ?Depression Screen ? ?  01/05/2022  ? 10:47 AM 06/20/2021  ?  2:51 PM 01/04/2021  ? 10:27 AM 10/17/2019  ? 11:00 AM  ?PHQ 2/9 Scores  ?PHQ - 2 Score 0 0 0 0  ?PHQ- 9 Score   0   ?  ?Fall Risk ? ?  01/05/2022  ? 10:50 AM 06/20/2021  ?  2:51 PM 01/05/2021  ?  8:41 AM 01/04/2021  ? 10:26 AM 10/17/2019  ? 11:00 AM  ?Fall Risk   ?Falls in the past year? 1 1 0 0 0  ?Number falls in past yr: 0 0 0 0 0  ?Injury with Fall? 1 1 0 0 0  ?Risk for fall due to : Heather [name] Fall Risks History of fall(s)  Medication side effect   ?Follow up Falls prevention discussed Falls evaluation completed  Falls evaluation completed;Falls  prevention discussed   ? ? ?FALL RISK PREVENTION PERTAINING TO THE HOME: ? ?Any stairs in or around the home? Yes  ?If so, are there any without handrails? Heather [name]  ?Home free of loose throw rugs in walkways, pet bed

## 2022-01-05 NOTE — Patient Instructions (Signed)
Heather [name] , ?Thank you for taking time to come for your Medicare Wellness Visit. I appreciate your ongoing commitment to your health goals. Please review the following plan we discussed and let me know if I can assist you in the future.  ? ?Screening recommendations/referrals: ?Colonoscopy: done 08/09/21. Repeat 07/2024 ?Mammogram: done 03/22/21 ?Bone Density: done 01/09/20 ?Recommended yearly ophthalmology/optometry visit for glaucoma screening and checkup ?Recommended yearly dental visit for hygiene and checkup ? ?Vaccinations: ?Influenza vaccine: declined ?Pneumococcal vaccine: declined ?Tdap vaccine: done 10/11/18 ?Shingles vaccine: Shingrix discussed. Please contact your pharmacy for coverage information.  ?Covid-19: done 10/07/19, 10/28/19, 07/31/20, 03/01/21 & 08/29/21 ? ?Advanced directives: Advance directive discussed with you today. Please request a copy at the office for you to complete at home and have notarized. Once this is complete please bring a copy in to our office so we can scan it into your chart.  ? ?Conditions/risks identified: Keep up the great work! ? ?Next appointment: Follow up in one year for your annual wellness visit  ? ? ?Preventive Care 25 Years and Older, Female ?Preventive care refers to lifestyle choices and visits with your health care provider that can promote health and wellness. ?What does preventive care include? ?A yearly physical exam. This is also called an annual well check. ?Dental exams once or twice a year. ?Routine eye exams. Ask your health care provider how often you should have your eyes checked. ?Personal lifestyle choices, including: ?Daily care of your teeth and gums. ?Regular physical activity. ?Eating a healthy diet. ?Avoiding tobacco and drug use. ?Limiting alcohol use. ?Practicing safe sex. ?Taking low-dose aspirin every day. ?Taking vitamin and mineral supplements as recommended by your health care provider. ?What happens during an annual well check? ?The services  and screenings done by your health care provider during your annual well check will depend on your age, overall health, lifestyle risk factors, and family history of disease. ?Counseling  ?Your health care provider may ask you questions about your: ?Alcohol use. ?Tobacco use. ?Drug use. ?Emotional well-being. ?Home and relationship well-being. ?Sexual activity. ?Eating habits. ?History of falls. ?Memory and ability to understand (cognition). ?Work and work Astronomer. ?Reproductive health. ?Screening  ?You may have the following tests or measurements: ?Height, weight, and BMI. ?Blood pressure. ?Lipid and cholesterol levels. These may be checked every 5 years, or more frequently if you are over 43 years old. ?Skin check. ?Lung cancer screening. You may have this screening every year starting at age 87 if you have a 30-pack-year history of smoking and currently smoke or have quit within the past 15 years. ?Fecal occult blood test (FOBT) of the stool. You may have this test every year starting at age 43. ?Flexible sigmoidoscopy or colonoscopy. You may have a sigmoidoscopy every 5 years or a colonoscopy every 10 years starting at age 40. ?Hepatitis C blood test. ?Hepatitis B blood test. ?Sexually transmitted disease (STD) testing. ?Diabetes screening. This is done by checking your blood sugar (glucose) after you have not eaten for a while (fasting). You may have this done every 1-3 years. ?Bone density scan. This is done to screen for osteoporosis. You may have this done starting at age 15. ?Mammogram. This may be done every 1-2 years. Talk to your health care provider about how often you should have regular mammograms. ?Talk with your health care provider about your test results, treatment options, and if necessary, the need for more tests. ?Vaccines  ?Your health care provider may recommend certain vaccines, such as: ?Influenza vaccine.  This is recommended every year. ?Tetanus, diphtheria, and acellular pertussis  (Tdap, Td) vaccine. You may need a Td booster every 10 years. ?Zoster vaccine. You may need this after age 51. ?Pneumococcal 13-valent conjugate (PCV13) vaccine. One dose is recommended after age 46. ?Pneumococcal polysaccharide (PPSV23) vaccine. One dose is recommended after age 83. ?Talk to your health care provider about which screenings and vaccines you need and how often you need them. ?This information is not intended to replace advice given to you by your health care provider. Make sure you discuss any questions you have with your health care provider. ?Document Released: 10/01/2015 Document Revised: 05/24/2016 Document Reviewed: 07/06/2015 ?Elsevier Interactive Patient Education ? 2017 Elsevier Inc. ? ?Fall Prevention in the Home ?Falls can cause injuries. They can happen to people of all ages. There are many things you can do to make your home safe and to help prevent falls. ?What can I do on the outside of my home? ?Regularly fix the edges of walkways and driveways and fix any cracks. ?Remove anything that might make you trip as you walk through a door, such as a raised step or threshold. ?Trim any bushes or trees on the path to your home. ?Use bright outdoor lighting. ?Clear any walking paths of anything that might make someone trip, such as rocks or tools. ?Regularly check to see if handrails are loose or broken. Make sure that both sides of any steps have handrails. ?Any raised decks and porches should have guardrails on the edges. ?Have any leaves, snow, or ice cleared regularly. ?Use sand or salt on walking paths during winter. ?Clean up any spills in your garage right away. This includes oil or grease spills. ?What can I do in the bathroom? ?Use night lights. ?Install grab bars by the toilet and in the tub and shower. Do not use towel bars as grab bars. ?Use non-skid mats or decals in the tub or shower. ?If you need to sit down in the shower, use a plastic, non-slip stool. ?Keep the floor dry. Clean  up any water that spills on the floor as soon as it happens. ?Remove soap buildup in the tub or shower regularly. ?Attach bath mats securely with double-sided non-slip rug tape. ?Do not have throw rugs and other things on the floor that can make you trip. ?What can I do in the bedroom? ?Use night lights. ?Make sure that you have a light by your bed that is easy to reach. ?Do not use any sheets or blankets that are too big for your bed. They should not hang down onto the floor. ?Have a firm chair that has side arms. You can use this for support while you get dressed. ?Do not have throw rugs and other things on the floor that can make you trip. ?What can I do in the kitchen? ?Clean up any spills right away. ?Avoid walking on wet floors. ?Keep items that you use a lot in easy-to-reach places. ?If you need to reach something above you, use a strong step stool that has a grab bar. ?Keep electrical cords out of the way. ?Do not use floor polish or wax that makes floors slippery. If you must use wax, use non-skid floor wax. ?Do not have throw rugs and other things on the floor that can make you trip. ?What can I do with my stairs? ?Do not leave any items on the stairs. ?Make sure that there are handrails on both sides of the stairs and use them. Fix handrails  that are broken or loose. Make sure that handrails are as long as the stairways. ?Check any carpeting to make sure that it is firmly attached to the stairs. Fix any carpet that is loose or worn. ?Avoid having throw rugs at the top or bottom of the stairs. If you do have throw rugs, attach them to the floor with carpet tape. ?Make sure that you have a light switch at the top of the stairs and the bottom of the stairs. If you do not have them, ask someone to add them for you. ?What else can I do to help prevent falls? ?Wear shoes that: ?Do not have high heels. ?Have rubber bottoms. ?Are comfortable and fit you well. ?Are closed at the toe. Do not wear sandals. ?If you  use a stepladder: ?Make sure that it is fully opened. Do not climb a closed stepladder. ?Make sure that both sides of the stepladder are locked into place. ?Ask someone to hold it for you, if possible. ?Cl

## 2022-01-20 ENCOUNTER — Encounter: Payer: Self-pay | Admitting: Primary Care

## 2022-01-20 ENCOUNTER — Ambulatory Visit (INDEPENDENT_AMBULATORY_CARE_PROVIDER_SITE_OTHER): Payer: Medicare PPO | Admitting: Primary Care

## 2022-01-20 VITALS — BP 128/72 | HR 69 | Ht 65.0 in | Wt 147.0 lb

## 2022-01-20 DIAGNOSIS — Z23 Encounter for immunization: Secondary | ICD-10-CM | POA: Diagnosis not present

## 2022-01-20 DIAGNOSIS — H3530 Unspecified macular degeneration: Secondary | ICD-10-CM | POA: Diagnosis not present

## 2022-01-20 DIAGNOSIS — I10 Essential (primary) hypertension: Secondary | ICD-10-CM | POA: Diagnosis not present

## 2022-01-20 DIAGNOSIS — Z0000 Encounter for general adult medical examination without abnormal findings: Secondary | ICD-10-CM

## 2022-01-20 DIAGNOSIS — E785 Hyperlipidemia, unspecified: Secondary | ICD-10-CM | POA: Diagnosis not present

## 2022-01-20 DIAGNOSIS — E538 Deficiency of other specified B group vitamins: Secondary | ICD-10-CM | POA: Diagnosis not present

## 2022-01-20 DIAGNOSIS — K219 Gastro-esophageal reflux disease without esophagitis: Secondary | ICD-10-CM | POA: Insufficient documentation

## 2022-01-20 DIAGNOSIS — N182 Chronic kidney disease, stage 2 (mild): Secondary | ICD-10-CM | POA: Diagnosis not present

## 2022-01-20 DIAGNOSIS — F3342 Major depressive disorder, recurrent, in full remission: Secondary | ICD-10-CM

## 2022-01-20 LAB — COMPREHENSIVE METABOLIC PANEL
ALT: 15 U/L (ref 0–35)
AST: 15 U/L (ref 0–37)
Albumin: 4.2 g/dL (ref 3.5–5.2)
Alkaline Phosphatase: 80 U/L (ref 39–117)
BUN: 20 mg/dL (ref 6–23)
CO2: 29 mEq/L (ref 19–32)
Calcium: 9 mg/dL (ref 8.4–10.5)
Chloride: 104 mEq/L (ref 96–112)
Creatinine, Ser: 1.11 mg/dL (ref 0.40–1.20)
GFR: 51.37 mL/min — ABNORMAL LOW (ref >60.00)
Glucose, Bld: 93 mg/dL (ref 70–99)
Potassium: 4.6 mEq/L (ref 3.5–5.1)
Sodium: 143 mEq/L (ref 135–145)
Total Bilirubin: 1.7 mg/dL — ABNORMAL HIGH (ref 0.2–1.2)
Total Protein: 6.7 g/dL (ref 6.0–8.3)

## 2022-01-20 LAB — LIPID PANEL
Cholesterol: 148 mg/dL (ref 0–200)
HDL: 46.7 mg/dL (ref >39.00)
LDL Cholesterol: 89 mg/dL (ref 0–99)
NonHDL: 101.68
Total CHOL/HDL Ratio: 3
Triglycerides: 62 mg/dL (ref 0.0–149.0)
VLDL: 12.4 mg/dL (ref 0.0–40.0)

## 2022-01-20 LAB — VITAMIN B12: Vitamin B-12: 326 pg/mL (ref 211–911)

## 2022-01-20 NOTE — Assessment & Plan Note (Signed)
Repeat renal function pending. ? ?She avoids recurrent NSAID use. ?Discussed to increase water intake.  ?

## 2022-01-20 NOTE — Progress Notes (Signed)
? ?Subjective:  ? ? Patient ID: Heather FiguresCheryl L Corona, female    DOB: 1956/03/25, 66 y.o.   MRN: [id number] ? ?HPI ? ?Heather [name] is a very pleasant 66 y.o. female who presents today for complete physical and follow up of chronic conditions. ? ?She's been under a tremendous amount of stress as she is the sole caregiver of her husband. She's also had Covid-19, influenza, dog bite, and broke her elbow last year. Her husband has uncontrolled anxiety and depends on her to be around her daily. She's managed on Cymbalta 30 mg daily. She doubled her dose of Cymbalta for a period of time and noticed improvement, she's since reduced her dose to 30 mg.  ? ?Immunizations: ?-Tetanus: 2020 ?-Influenza: Did not complete last season  ?-Covid-19: 3 vaccine ?-Shingles: Has not completed ?-Pneumonia: Has not completed ? ?Diet: Fair diet.  ?Exercise: No regular exercise. ? ?Eye exam: Completes annually  ?Dental exam: Completes semi-annually  ? ?Mammogram: Completed in July 2022 ?Colonoscopy: Completed in 2022, due 2025 ?Dexa: Completed in April 2021 ? ?BP Readings from Last 3 Encounters:  ?01/20/22 128/72  ?07/11/21 (!) 147/75  ?05/03/21 (!) 169/89  ? ? ? ? ? ?Review of Systems  ?Constitutional:  Negative for unexpected weight change.  ?HENT:  Negative for rhinorrhea.   ?Respiratory:  Negative for cough and shortness of breath.   ?Cardiovascular:  Negative for chest pain.  ?Gastrointestinal:  Negative for constipation and diarrhea.  ?Genitourinary:  Negative for difficulty urinating.  ?Musculoskeletal:  Negative for arthralgias and myalgias.  ?Skin:  Negative for rash.  ?Allergic/Immunologic: Negative for environmental allergies.  ?Neurological:  Negative for dizziness and headaches.  ?Psychiatric/Behavioral:  The patient is nervous/anxious.   ? ?   ? ? ?Past Medical History:  ?Diagnosis Date  ? B12 deficiency   ? Chronic kidney disease   ? COVID-19 virus infection 06/20/2021  ? Depression   ? Dog bite 05/03/2021  ? GERD (gastroesophageal  reflux disease)   ? Hyperlipidemia   ? Hypertension   ? had ankle swelling with amlodipine  ? Infection of urinary tract 12/16/2014  ? RLS (restless legs syndrome)   ? ? ?Social History  ? ?Socioeconomic History  ? Marital status: Married  ?  Spouse name: Not on file  ? Number of children: 1  ? Years of education: Not on file  ? Highest education level: Not on file  ?Occupational History  ? Occupation: retired school principal  ?  Associate Professormployer: Retired  ? Occupation: HR (part-time)  ?  Employer: [employer]  ?Tobacco Use  ? Smoking status: Never  ? Smokeless tobacco: Never  ?Vaping Use  ? Vaping Use: Never used  ?Substance and Sexual Activity  ? Alcohol use: No  ? Drug use: No  ? Sexual activity: Not on file  ?Other Topics Concern  ? Not on file  ?Social History Narrative  ? Works part-time in [employer] for [employer] [place] Married  ? ?Social Determinants of Health  ? ?Financial Resource Strain: Low Risk   ? Difficulty of Paying Living Expenses: Not hard at all  ?Food Insecurity: No Food Insecurity  ? Worried About Programme researcher, broadcasting/film/videounning Out of Food in the Last Year: Never true  ? Ran Out of Food in the Last Year: Never true  ?Transportation Needs: No Transportation Needs  ? Lack of Transportation (Medical): No  ? Lack of Transportation (Non-Medical): No  ?Physical Activity: Sufficiently Active  ? Days of Exercise per Week: 4 days  ?  Minutes of Exercise per Session: 50 min  ?Stress: No Stress Concern Present  ? Feeling of Stress : Only a little  ?Social Connections: Moderately Integrated  ? Frequency of Communication with Friends and Family: More than three times a week  ? Frequency of Social Gatherings with Friends and Family: Three times a week  ? Attends Religious Services: More than 4 times per year  ? Active Member of Clubs or Organizations: No  ? Attends Banker Meetings: Never  ? Marital Status: Married  ?Intimate Partner Violence: Not At Risk  ? Fear of Current or Ex-Partner: No  ?  Emotionally Abused: No  ? Physically Abused: No  ? Sexually Abused: No  ? ? ?Past Surgical History:  ?Procedure Laterality Date  ? CATARACT EXTRACTION, BILATERAL  2021  ? SALPINGOOPHORECTOMY    ? right; laproscopic  ? ? ?Family History  ?Problem Relation Age of Onset  ? Hypertension Mother   ? Heart attack Mother 60  ?     sudden cardiac death  ? COPD Father   ? Heart attack Father 40  ?     developed ischemic cardiomyopathy  ? ? ?Allergies  ?Allergen Reactions  ? Amoxicillin Diarrhea  ? Augmentin [Amoxicillin-Pot Clavulanate] Diarrhea  ? ? ?Current Outpatient Medications on File Prior to Visit  ?Medication Sig Dispense Refill  ? acetaminophen (TYLENOL) 325 MG tablet Take 650 mg by mouth every 6 (six) hours as needed.    ? atenolol (TENORMIN) 25 MG tablet Take 1 tablet (25 mg total) by mouth daily. For Blood pressure 90 tablet 3  ? DULoxetine (CYMBALTA) 30 MG capsule Take 1 capsule (30 mg total) by mouth daily. For Anxiety and depression. 90 capsule 3  ? losartan (COZAAR) 50 MG tablet Take 1 tablet (50 mg total) by mouth daily. For blood pressure. 90 tablet 3  ? omeprazole (PRILOSEC) 20 MG capsule Take 1 capsule (20 mg total) by mouth 2 (two) times daily. 180 capsule 3  ? simvastatin (ZOCOR) 40 MG tablet TAKE 1 TABLET BY MOUTH AT BEDTIME FOR CHOLESTEROL 90 tablet 3  ? ?Current Facility-Administered Medications on File Prior to Visit  ?Medication Dose Route Frequency Provider Last Rate Last Admin  ? cyanocobalamin ((VITAMIN B-12)) injection 1,000 mcg  1,000 mcg Intramuscular Once Doreene Nest, NP      ? ? ?BP 128/72   Pulse 69   Ht 5\' 5"  (1.651 m)   Wt 147 lb (66.7 kg)   SpO2 98%   BMI 24.46 kg/m?  ?Objective:  ? Physical Exam ?HENT:  ?   Right Ear: Tympanic membrane and ear canal normal.  ?   Left Ear: Tympanic membrane and ear canal normal.  ?   Nose: Nose normal.  ?Eyes:  ?   Conjunctiva/sclera: Conjunctivae normal.  ?   Pupils: Pupils are equal, round, and reactive to light.  ?Neck:  ?   Thyroid: No  thyromegaly.  ?Cardiovascular:  ?   Rate and Rhythm: Normal rate and regular rhythm.  ?   Heart sounds: No murmur heard. ?Pulmonary:  ?   Effort: Pulmonary effort is normal.  ?   Breath sounds: Normal breath sounds. No rales.  ?Abdominal:  ?   General: Bowel sounds are normal.  ?   Palpations: Abdomen is soft.  ?   Tenderness: There is no abdominal tenderness.  ?Musculoskeletal:     ?   General: Normal range of motion.  ?   Cervical back: Neck supple.  ?Lymphadenopathy:  ?  Cervical: No cervical adenopathy.  ?Skin: ?   General: Skin is warm and dry.  ?   Findings: No rash.  ?Neurological:  ?   Mental Status: She is alert and oriented to person, place, and time.  ?   Cranial Nerves: No cranial nerve deficit.  ?   Deep Tendon Reflexes: Reflexes are normal and symmetric.  ?Psychiatric:     ?   Mood and Affect: Mood normal.  ? ? ? ? ? ?   ?Assessment & Plan:  ? ? ? ? ?This visit occurred during the SARS-CoV-2 public health emergency.  Safety protocols were in place, including screening questions prior to the visit, additional usage of staff PPE, and extensive cleaning of exam room while observing appropriate contact time as indicated for disinfecting solutions.  ?

## 2022-01-20 NOTE — Assessment & Plan Note (Signed)
Compliant to B12 injections monthly. ? ?Consider transitioning to oral B12 1000 mcg daily. ?Repeat B12 level pending. ?

## 2022-01-20 NOTE — Patient Instructions (Signed)
Stop by the lab prior to leaving today. I will notify you of your results once received.  ? ?Schedule your bone density scan and mammogram for the summer. ? ?It was a pleasure to see you today! ? ?I want Preventive Care 74 Years and Older, Female ?Preventive care refers to lifestyle choices and visits with your health care provider that can promote health and wellness. Preventive care visits are also called wellness exams. ?What can I expect for my preventive care visit? ?Counseling ?Your health care provider may ask you questions about your: ?Medical history, including: ?Past medical problems. ?Family medical history. ?Pregnancy and menstrual history. ?History of falls. ?Current health, including: ?Memory and ability to understand (cognition). ?Emotional well-being. ?Home life and relationship well-being. ?Sexual activity and sexual health. ?Lifestyle, including: ?Alcohol, nicotine or tobacco, and drug use. ?Access to firearms. ?Diet, exercise, and sleep habits. ?Work and work Statistician. ?Sunscreen use. ?Safety issues such as seatbelt and bike helmet use. ?Physical exam ?Your health care provider will check your: ?Height and weight. These may be used to calculate your BMI (body mass index). BMI is a measurement that tells if you are at a healthy weight. ?Waist circumference. This measures the distance around your waistline. This measurement also tells if you are at a healthy weight and may help predict your risk of certain diseases, such as type 2 diabetes and high blood pressure. ?Heart rate and blood pressure. ?Body temperature. ?Skin for abnormal spots. ?What immunizations do I need? ? ?Vaccines are usually given at various ages, according to a schedule. Your health care provider will recommend vaccines for you based on your age, medical history, and lifestyle or other factors, such as travel or where you work. ?What tests do I need? ?Screening ?Your health care provider may recommend screening tests for  certain conditions. This may include: ?Lipid and cholesterol levels. ?Hepatitis C test. ?Hepatitis B test. ?HIV (human immunodeficiency virus) test. ?STI (sexually transmitted infection) testing, if you are at risk. ?Lung cancer screening. ?Colorectal cancer screening. ?Diabetes screening. This is done by checking your blood sugar (glucose) after you have not eaten for a while (fasting). ?Mammogram. Talk with your health care provider about how often you should have regular mammograms. ?BRCA-related cancer screening. This may be done if you have a family history of breast, ovarian, tubal, or peritoneal cancers. ?Bone density scan. This is done to screen for osteoporosis. ?Talk with your health care provider about your test results, treatment options, and if necessary, the need for more tests. ?Follow these instructions at home: ?Eating and drinking ? ?Eat a diet that includes fresh fruits and vegetables, whole grains, lean protein, and low-fat dairy products. Limit your intake of foods with high amounts of sugar, saturated fats, and salt. ?Take vitamin and mineral supplements as recommended by your health care provider. ?Do not drink alcohol if your health care provider tells you not to drink. ?If you drink alcohol: ?Limit how much you have to 0-1 drink a day. ?Know how much alcohol is in your drink. In the [place] one drink equals one 12 oz bottle of beer (355 mL), one 5 oz glass of wine (148 mL), or one 1? oz glass of hard liquor (44 mL). ?Lifestyle ?Brush your teeth every morning and night with fluoride toothpaste. Floss one time each day. ?Exercise for at least 30 minutes 5 or more days each week. ?Do not use any products that contain nicotine or tobacco. These products include cigarettes, chewing tobacco, and vaping devices, such as e-cigarettes.  If you need help quitting, ask your health care provider. ?Do not use drugs. ?If you are sexually active, practice safe sex. Use a condom or other form of protection in  order to prevent STIs. ?Take aspirin only as told by your health care provider. Make sure that you understand how much to take and what form to take. Work with your health care provider to find out whether it is safe and beneficial for you to take aspirin daily. ?Ask your health care provider if you need to take a cholesterol-lowering medicine (statin). ?Find healthy ways to manage stress, such as: ?Meditation, yoga, or listening to music. ?Journaling. ?Talking to a trusted person. ?Spending time with friends and family. ?Minimize exposure to UV radiation to reduce your risk of skin cancer. ?Safety ?Always wear your seat belt while driving or riding in a vehicle. ?Do not drive: ?If you have been drinking alcohol. Do not ride with someone who has been drinking. ?When you are tired or distracted. ?While texting. ?If you have been using any mind-altering substances or drugs. ?Wear a helmet and other protective equipment during sports activities. ?If you have firearms in your house, make sure you follow all gun safety procedures. ?What's next? ?Visit your health care provider once a year for an annual wellness visit. ?Ask your health care provider how often you should have your eyes and teeth checked. ?Stay up to date on all vaccines. ?This information is not intended to replace advice given to you by your health care provider. Make sure you discuss any questions you have with your health care provider. ?Document Revised: 03/02/2021 Document Reviewed: 03/02/2021 ?Elsevier Patient Education ? [place]. ? ?

## 2022-01-20 NOTE — Assessment & Plan Note (Signed)
Prevnar 20 provided today.  She will obtain Shingrix vaccines from pharmacy. ? ?Mammogram and bone density scan due this summer.  She is aware. ? ?Colonoscopy up-to-date, due 2025. ? ?Encouraged healthy diet regular exercise. ? ?Exam today stable, labs pending. ?

## 2022-01-20 NOTE — Assessment & Plan Note (Signed)
Controlled.   Continue omeprazole 20 mg daily. 

## 2022-01-20 NOTE — Assessment & Plan Note (Signed)
Waxes and wanes. ? ?Offered to increase Cymbalta to 60 mg daily, she kindly declines as she's doing overall well now.  ? ?She will update if she changes her mind. Her husband will be seeing his PCP soon and they plan to discuss his anxiety. ?

## 2022-01-20 NOTE — Assessment & Plan Note (Signed)
Controlled. ? ?Continue atenolol 25 mg daily, losartan 50 mg daily. ? ?CMP pending.  ?

## 2022-01-20 NOTE — Assessment & Plan Note (Signed)
Continue simvastatin 40 mg daily. Repeat lipid panel pending.  

## 2022-01-20 NOTE — Addendum Note (Signed)
Addended by: Winn Jock on: 01/20/2022 12:07 PM ? ? Modules accepted: Orders ? ?

## 2022-01-20 NOTE — Assessment & Plan Note (Signed)
Following with retinal specialist, may undergo retinal injections in Fall 2023. ?

## 2022-02-08 DIAGNOSIS — L814 Other melanin hyperpigmentation: Secondary | ICD-10-CM | POA: Diagnosis not present

## 2022-02-08 DIAGNOSIS — L820 Inflamed seborrheic keratosis: Secondary | ICD-10-CM | POA: Diagnosis not present

## 2022-02-08 DIAGNOSIS — L570 Actinic keratosis: Secondary | ICD-10-CM | POA: Diagnosis not present

## 2022-02-08 DIAGNOSIS — B078 Other viral warts: Secondary | ICD-10-CM | POA: Diagnosis not present

## 2022-05-15 DIAGNOSIS — L570 Actinic keratosis: Secondary | ICD-10-CM | POA: Diagnosis not present

## 2022-05-15 DIAGNOSIS — L821 Other seborrheic keratosis: Secondary | ICD-10-CM | POA: Diagnosis not present

## 2022-05-31 DIAGNOSIS — H353112 Nonexudative age-related macular degeneration, right eye, intermediate dry stage: Secondary | ICD-10-CM | POA: Diagnosis not present

## 2022-05-31 DIAGNOSIS — H353123 Nonexudative age-related macular degeneration, left eye, advanced atrophic without subfoveal involvement: Secondary | ICD-10-CM | POA: Diagnosis not present

## 2022-07-14 ENCOUNTER — Ambulatory Visit
Admission: RE | Admit: 2022-07-14 | Discharge: 2022-07-14 | Disposition: A | Discharge status: Home / Self Care | Payer: Medicare PPO | Source: Ambulatory Visit | Attending: Primary Care | Admitting: Primary Care

## 2022-07-14 DIAGNOSIS — M85851 Other specified disorders of bone density and structure, right thigh: Secondary | ICD-10-CM | POA: Diagnosis not present

## 2022-07-14 DIAGNOSIS — Z1231 Encounter for screening mammogram for malignant neoplasm of breast: Secondary | ICD-10-CM

## 2022-07-14 DIAGNOSIS — Z780 Asymptomatic menopausal state: Secondary | ICD-10-CM | POA: Diagnosis not present

## 2022-07-27 ENCOUNTER — Other Ambulatory Visit: Payer: Self-pay | Admitting: Primary Care

## 2022-07-27 DIAGNOSIS — I10 Essential (primary) hypertension: Secondary | ICD-10-CM

## 2022-08-14 ENCOUNTER — Other Ambulatory Visit: Payer: Self-pay | Admitting: Primary Care

## 2022-08-14 DIAGNOSIS — I10 Essential (primary) hypertension: Secondary | ICD-10-CM

## 2022-08-14 DIAGNOSIS — F3342 Major depressive disorder, recurrent, in full remission: Secondary | ICD-10-CM

## 2022-08-14 DIAGNOSIS — E785 Hyperlipidemia, unspecified: Secondary | ICD-10-CM

## 2022-08-24 ENCOUNTER — Ambulatory Visit: Payer: Medicare PPO | Admitting: Primary Care

## 2022-10-23 ENCOUNTER — Other Ambulatory Visit: Payer: Self-pay

## 2022-10-23 ENCOUNTER — Emergency Department (HOSPITAL_BASED_OUTPATIENT_CLINIC_OR_DEPARTMENT_OTHER)
Admission: EM | Admit: 2022-10-23 | Discharge: 2022-10-23 | Disposition: A | Discharge status: Home / Self Care | Payer: Medicare PPO | Attending: Emergency Medicine | Admitting: Emergency Medicine

## 2022-10-23 ENCOUNTER — Telehealth: Payer: Self-pay | Admitting: Primary Care

## 2022-10-23 ENCOUNTER — Emergency Department (HOSPITAL_BASED_OUTPATIENT_CLINIC_OR_DEPARTMENT_OTHER): Payer: Medicare PPO | Admitting: Radiology

## 2022-10-23 DIAGNOSIS — R55 Syncope and collapse: Secondary | ICD-10-CM | POA: Diagnosis not present

## 2022-10-23 DIAGNOSIS — I129 Hypertensive chronic kidney disease with stage 1 through stage 4 chronic kidney disease, or unspecified chronic kidney disease: Secondary | ICD-10-CM | POA: Insufficient documentation

## 2022-10-23 DIAGNOSIS — R0602 Shortness of breath: Secondary | ICD-10-CM | POA: Insufficient documentation

## 2022-10-23 DIAGNOSIS — N189 Chronic kidney disease, unspecified: Secondary | ICD-10-CM | POA: Diagnosis not present

## 2022-10-23 DIAGNOSIS — Z79899 Other long term (current) drug therapy: Secondary | ICD-10-CM | POA: Insufficient documentation

## 2022-10-23 DIAGNOSIS — R002 Palpitations: Secondary | ICD-10-CM | POA: Diagnosis present

## 2022-10-23 LAB — BASIC METABOLIC PANEL
Anion gap: 9 (ref 5–15)
BUN: 22 mg/dL (ref 8–23)
CO2: 28 mmol/L (ref 22–32)
Calcium: 9.5 mg/dL (ref 8.9–10.3)
Chloride: 105 mmol/L (ref 98–111)
Creatinine, Ser: 1.07 mg/dL — ABNORMAL HIGH (ref 0.44–1.00)
GFR, Estimated: 57 mL/min — ABNORMAL LOW (ref >60)
Glucose, Bld: 93 mg/dL (ref 70–99)
Potassium: 4.1 mmol/L (ref 3.5–5.1)
Sodium: 142 mmol/L (ref 135–145)

## 2022-10-23 LAB — CBC
HCT: 37 % (ref 36.0–46.0)
Hemoglobin: 12.3 g/dL (ref 12.0–15.0)
MCH: 30 pg (ref 26.0–34.0)
MCHC: 33.2 g/dL (ref 30.0–36.0)
MCV: 90.2 fL (ref 80.0–100.0)
Platelets: 223 10*3/uL (ref 150–400)
RBC: 4.1 MIL/uL (ref 3.87–5.11)
RDW: 12.6 % (ref 11.5–15.5)
WBC: 5.7 10*3/uL (ref 4.0–10.5)
nRBC: 0 % (ref 0.0–0.2)

## 2022-10-23 LAB — D-DIMER, QUANTITATIVE: D-Dimer, Quant: 0.47 ug/mL-FEU (ref 0.00–0.50)

## 2022-10-23 LAB — TROPONIN I (HIGH SENSITIVITY)
Troponin I (High Sensitivity): 4 ng/L (ref <18)
Troponin I (High Sensitivity): 4 ng/L (ref <18)

## 2022-10-23 LAB — T4, FREE: Free T4: 0.81 ng/dL (ref 0.61–1.12)

## 2022-10-23 LAB — TSH: TSH: 0.987 u[IU]/mL (ref 0.350–4.500)

## 2022-10-23 NOTE — Telephone Encounter (Signed)
I spoke with pt and she has decided to go to [facility] ED this morning; pt said she did not have any CP but has had stinging feeling in chest on and off. Pt said it does concern her since her mom had heart burn and then passed away due to sudden heart issue  ?heart attack. Pt wanted note sent to Allie Bossier NP to be aware of pts symptoms and pt will cb with update on ED findings and how pt is feeling. Sending note to Gentry Fitz NP who is out of [employer] NP who is in office and [facility] pool.

## 2022-10-23 NOTE — ED Triage Notes (Signed)
Pt reports 2 weeks ago she had an episode when walking the dog, felt like heart was racing. Yesterday morning, pt had a similar feeling when she woke up of heart racing, currently denies this feeling, but was encouraged to come by PCP.

## 2022-10-23 NOTE — Telephone Encounter (Signed)
[facility] - Client TELEPHONE ADVICE RECORD [facility] Patient Name: Heather [name] Gender: Female DOB: 02-01-1956 Age: 67 Y 36 M 6 D Return Phone Number: [phone] (Primary) Address: City/ State/ Zip: [place]  [address] Client [facility] - Client Client Site [facility] - Day Provider Alma Friendly - NP Contact Type Call Who Is Calling Patient / Member / Family / Caregiver Call Type Triage / Clinical Relationship To Patient Self Return Phone Number [phone] (Primary) Chief Complaint Health information question (non symptomatic) Reason for Call Symptomatic / Request for Health Information Initial Comment Caller was transferred from the office. Patient has been experiencing dizziness, and irregular heart rate. Right now she is fine but she had these symptoms a few days ago. Translation No Nurse Assessment Nurse: Lucky Cowboy, RN, Levada Dy Date/Time (Eastern Time): 10/23/2022 9:06:29 AM Confirm and document reason for call. If symptomatic, describe symptoms. ---Caller stated that she had irregular heart beat and dizziness 2 weeks ago, fast, and occurred while she walking the dog. She was also short of breath. It lasted about 30 min. She hadn't taken her morning meds yet, so she went home and took them. Sat night, she coughed a few times and the heart rate increased again. It subsided before she could get a heart rate. Does the patient have any new or worsening symptoms? ---Yes Will a triage be completed? ---Yes Related visit to physician within the last 2 weeks? ---No Does the PT have any chronic conditions? (i.e. diabetes, asthma, this includes High risk factors for pregnancy, etc.) ---Yes List chronic conditions. ---HTN, depression, high cholesterol Is this a behavioral health or substance abuse call? ---No Guidelines Guideline Title Affirmed Question Affirmed Notes Nurse Date/Time  (Eastern Time) Heart Rate and Heartbeat Questions [1] Skipped or extra beat(s) AND [2] increases with exercise or exertion Dew, RN, Levada Dy 10/23/2022 9:11:18 AM PLEASE NOTE: All timestamps contained within this report are represented as [place] Standard Time. CONFIDENTIALTY NOTICE: This fax transmission is intended only for the addressee. It contains information that is legally privileged, confidential or otherwise protected from use or disclosure. If you are not the intended recipient, you are strictly prohibited from reviewing, disclosing, copying using or disseminating any of this information or taking any action in reliance on or regarding this information. If you have received this fax in error, please notify us immediately by telephone so that we can arrange for its return to [place]. Phone: [phone], Toll-Free: [phone], Fax: [phone] Page: 2 of 2 Call Id: [id number] [place]. Time Eilene Ghazi Time) Disposition Final User 10/23/2022 9:15:47 AM See HCP within 4 Hours (or PCP triage) Yes Lucky Cowboy, RN, Levada Dy Final Disposition 10/23/2022 9:15:47 AM See HCP within 4 Hours (or PCP triage) Yes Dew, RN, Marin Shutter Disagree/Comply Comply Caller Understands Yes PreDisposition Call Doctor Care Advice Given Per Guideline SEE HCP (OR PCP TRIAGE) WITHIN 4 HOURS: * IF OFFICE WILL BE OPEN: You need to be seen within the next 3 or 4 hours. Call your doctor (or NP/PA) now or as soon as the office opens. BRING MEDICINES: * Please bring a list of your current medicines when you go to see the doctor. * It is also a good idea to bring the pill bottles too. This will help the doctor to make certain you are taking the right medicines and the right dose. CALL BACK IF: * You become worse CARE ADVICE given per Heart Rate and Heartbeat Questions (Adult) guideline. Comments User: Levada Dy,  Dew, RN Date/Time Eilene Ghazi Time): 10/23/2022 9:21:30 AM Caller stated that she wants an app't for tomorrow at the office if she  can't get seen today. I stressed importance of being evaluated today in next 3-4 hours, she's already had 2 episodes in the past 2 weeks. I explained that she could be going into a bad heart rhythm and back out of it with exercise then resting, but some can lead to stroke or MI, esp if they continue. She's going to think about what I've said. Referrals REFERRED TO PCP OFFICE Warm transfer to [facility]

## 2022-10-23 NOTE — Telephone Encounter (Signed)
Patient called in today stating that over the weekend ,and one other time before she experienced heart racing and dizziness. She hasn't experienced it this morning,however I sent her to access nurse to be triaged.

## 2022-10-23 NOTE — Telephone Encounter (Signed)
Noted. Agree with evaluation. See that she is at the ED currently

## 2022-10-23 NOTE — ED Provider Notes (Signed)
[facility] Provider Note   CSN: [id number] Arrival date & time: 10/23/22  1203     History  Chief Complaint  Patient presents with   Tachycardia    Heather [name] is a 67 y.o. female. With a history of hypertension, CKD, depression, B12 deficiency who presents to the ED for evaluation of palpitations. She was walking her dog approximately 2 weeks ago when she suddenly developed palpitations, dizziness and shortness of breath. She did not take her medications that morning. She was able to walk back home and reported that the symptoms resolved spontaneously after approximately 30 minutes. She then developed a coughing fit yesterday morning shortly after waking up. This also resolved spontaneously after about 30 minutes. She called her primary care provider today to schedule an appointment and was told by nurse triage to be evaluated in the ED immediately. She has no symptoms today and states she feels overall very well. She reports compliance with her medications. Specifically denies all symptoms today including cough, shortness of breath, chest pain, dizziness, light headedness, history of heart disease outside of hypertension.   HPI     Home Medications Prior to Admission medications   Medication Sig Start Date End Date Taking? Authorizing Provider  acetaminophen (TYLENOL) 325 MG tablet Take 650 mg by mouth every 6 (six) hours as needed.    [provider]  atenolol (TENORMIN) 25 MG tablet Take 1 tablet by mouth once daily for blood pressure 07/27/22   Pleas Koch, NP  DULoxetine (CYMBALTA) 30 MG capsule TAKE 1 CAPSULE BY MOUTH ONCE DAILY FOR ANXIETY AND FOR DEPRESSION 08/14/22   Pleas Koch, NP  losartan (COZAAR) 50 MG tablet Take 1 tablet by mouth once daily for blood pressure 08/14/22   Pleas Koch, NP  omeprazole (PRILOSEC) 20 MG capsule Take 1 capsule (20 mg total) by mouth 2 (two) times daily. 06/01/15   Lucille Passy, MD  simvastatin (ZOCOR) 40 MG tablet TAKE 1 TABLET BY MOUTH AT BEDTIME FOR CHOLESTEROL 08/14/22   Pleas Koch, NP      Allergies    Amoxicillin and Augmentin [amoxicillin-pot clavulanate]    Review of Systems   Review of Systems  Neurological:  Positive for light-headedness.  All other systems reviewed and are negative.   Physical Exam Updated Vital Signs BP (!) 169/83   Pulse (!) 50   Temp 98.1 F (36.7 C) (Oral)   Resp 16   Ht 5\' 6"  (1.676 m)   Wt 65.8 kg   SpO2 100%   BMI 23.40 kg/m  Physical Exam Vitals and nursing note reviewed.  Constitutional:      General: She is not in acute distress.    Appearance: Normal appearance. She is well-developed. She is not ill-appearing, toxic-appearing or diaphoretic.  HENT:     Head: Normocephalic and atraumatic.  Eyes:     Conjunctiva/sclera: Conjunctivae normal.  Cardiovascular:     Rate and Rhythm: Normal rate and regular rhythm.     Pulses: Normal pulses.     Heart sounds: Normal heart sounds. No murmur heard. Pulmonary:     Effort: Pulmonary effort is normal. No respiratory distress.     Breath sounds: Normal breath sounds. No stridor. No wheezing, rhonchi or rales.  Abdominal:     Palpations: Abdomen is soft.     Tenderness: There is no abdominal tenderness.  Musculoskeletal:        General: No swelling.  Cervical back: Neck supple.     Right lower leg: No edema.     Left lower leg: No edema.  Skin:    General: Skin is warm and dry.     Capillary Refill: Capillary refill takes less than 2 seconds.  Neurological:     Mental Status: She is alert.  Psychiatric:        Mood and Affect: Mood normal.     ED Results / Procedures / Treatments   Labs (all labs ordered are listed, but only abnormal results are displayed) Labs Reviewed  BASIC METABOLIC PANEL - Abnormal; Notable for the following components:      Result Value   Creatinine, Ser 1.07 (*)    GFR, Estimated 57 (*)    All other  components within normal limits  CBC  TSH  D-DIMER, QUANTITATIVE  T4, FREE  TROPONIN I (HIGH SENSITIVITY)  TROPONIN I (HIGH SENSITIVITY)    EKG None  Radiology DG Chest 2 View  Result Date: 10/23/2022 CLINICAL DATA:  Shortness of breath, heart racing this weekend EXAM: CHEST - 2 VIEW COMPARISON:  07/11/2021 FINDINGS: Normal heart size, mediastinal contours, and pulmonary vascularity. Lungs clear. No pleural effusion or pneumothorax. Bones unremarkable. IMPRESSION: Normal exam. Electronically Signed   By: Lavonia Dana M.D.   On: 10/23/2022 14:45    Procedures Procedures    Medications Ordered in ED Medications - No data to display  ED Course/ Medical Decision Making/ A&P             HEART Score: 3                Medical Decision Making Amount and/or Complexity of Data Reviewed Labs: ordered.  This patient presents to the ED for concern of palpitations, this involves an extensive number of treatment options, and is a complaint that carries with it a high risk of complications and morbidity.  The differential diagnosis for palpitations includes cardiac arrhythmias, PVC/PAC, ACS, Cardiomyopathy, CHF, MVP, pericarditis, valvular disease, Panic/Anxiety, Somatic disorder, ETOH, Caffeine,  Stimulant use, medication side effect, Anemia, Hyperthyroidism, pulmonary embolism.   Co morbidities that complicate the patient evaluation  hypertension, CKD, depression, B12 deficiency  My initial workup includes ACS rule out  Additional history obtained from: Nursing notes from this visit. Previous records within EMR system nurse triage telephone conversation on 10/23/2022 Family husband is present and provides a portion of the history  I ordered, reviewed and interpreted labs which include: BMP, CBC, troponin, delta troponin, D-dimer, TSH, T4.  Labs within normal limits.  Troponins negative and flat  I ordered imaging studies including chest x-ray I independently visualized and interpreted  imaging which showed normal I agree with the radiologist interpretation  Cardiac Monitoring:  The patient was maintained on a cardiac monitor.  I personally viewed and interpreted the cardiac monitored which showed an underlying rhythm of: NSR  Afebrile, hypertensive but otherwise hemodynamically stable.  67 year old female presenting to the ED for evaluation of near syncope and shortness of breath that occurred 2 weeks ago.  Also had an episode of coughing and shortness of breath yesterday.  Each of the symptoms lasted approximately 30 minutes and then resolved spontaneously.  Not complaining of any symptoms today and states she feels overall very well.  Was encouraged to come to the ED by her primary care provider.  On examination, she appears overall very well.  There are no abnormalities.  Troponins were negative and flat.  D-dimer negative.  Electrolytes within normal limits.  No leukocytosis or anemia.  EKG shows no ischemic findings.  Chest x-ray unremarkable.  Low suspicion for ACS or PE as a cause of her symptoms.  Thyroid disorder is still on differential.  These labs are normal results from the ED today.  Her primary care should follow-up with these.  She was encouraged to call her primary care provider tomorrow to schedule a follow-up visit.  She was given return precautions.  Stable at discharge.  At this time there does not appear to be any evidence of an acute emergency medical condition and the patient appears stable for discharge with appropriate outpatient follow up. Diagnosis was discussed with patient who verbalizes understanding of care plan and is agreeable to discharge. I have discussed return precautions with patient and husband who verbalizes understanding. Patient encouraged to follow-up with their PCP within 1 week. All questions answered.  Patient's case discussed with Dr. Armandina Gemma who agrees with plan to discharge with follow-up.   Note: Portions of this report may have been  transcribed using voice recognition software. Every effort was made to ensure accuracy; however, inadvertent computerized transcription errors may still be present.        Final Clinical Impression(s) / ED Diagnoses Final diagnoses:  Near syncope    Rx / DC Orders ED Discharge Orders     None         Nehemiah Massed 10/23/22 1857    Regan Lemming, MD 10/25/22 1531

## 2022-10-23 NOTE — Discharge Instructions (Signed)
You have been seen today for your complaint of near syncope, coughing. Your lab work was reassuring and showed abnormalities. Your imaging was reassuring and showed no abnormalities. Follow up with: Your primary care provider.  You should call tomorrow to schedule an appointment for follow-up visit Please seek immediate medical care if you develop any of the following symptoms: You faint. You have any of these symptoms that may indicate trouble with your heart: Fast or irregular heartbeats (palpitations). Unusual pain in your chest, abdomen, or back. Shortness of breath. You have a seizure. You have a severe headache. You are confused. You have vision problems. You have severe weakness or trouble walking. You are bleeding from your mouth or rectum, or have black or tarry stool. At this time there does not appear to be the presence of an emergent medical condition, however there is always the potential for conditions to change. Please read and follow the below instructions.  Do not take your medicine if  develop an itchy rash, swelling in your mouth or lips, or difficulty breathing; call 911 and seek immediate emergency medical attention if this occurs.  You may review your lab tests and imaging results in their entirety on your MyChart account.  Please discuss all results of fully with your primary care provider and other specialist at your follow-up visit.  Note: Portions of this text may have been transcribed using voice recognition software. Every effort was made to ensure accuracy; however, inadvertent computerized transcription errors may still be present.

## 2022-10-23 NOTE — Telephone Encounter (Signed)
Noted. Will await ED notes.

## 2022-10-23 NOTE — ED Notes (Signed)
Discharge paperwork given and verbally understood. 

## 2022-10-27 ENCOUNTER — Encounter: Payer: Self-pay | Admitting: Primary Care

## 2022-10-27 ENCOUNTER — Ambulatory Visit: Payer: Medicare PPO | Admitting: Primary Care

## 2022-10-27 VITALS — BP 160/92 | HR 56 | Temp 98.6°F | Ht 66.0 in | Wt 149.0 lb

## 2022-10-27 DIAGNOSIS — I10 Essential (primary) hypertension: Secondary | ICD-10-CM | POA: Diagnosis not present

## 2022-10-27 DIAGNOSIS — R002 Palpitations: Secondary | ICD-10-CM | POA: Diagnosis not present

## 2022-10-27 NOTE — Patient Instructions (Signed)
Start monitoring your blood pressure daily, around the same time of day, for the next 2-3 weeks.  Ensure that you have rested for 30 minutes prior to checking your blood pressure.   Record your readings and send them to my on MyChart in 1 week.  You will either be contacted via phone regarding your referral to cardiology, or you may receive a letter on your MyChart portal from our referral team with instructions for scheduling an appointment. Please let us know if you have not been contacted by anyone within two weeks.  It was a pleasure to see you today!

## 2022-10-27 NOTE — Assessment & Plan Note (Addendum)
Reviewed ED notes, labs, imaging.   Referral placed to cardiology to evaluate for atrial fibrillation and for CAD risk assessment given her significant family history of CAD. Start aspirin 81 mg daily.  We will also have her track her BP at home, report readings to me via MyChart in 1 week.  Exam today benign.

## 2022-10-27 NOTE — Progress Notes (Signed)
Subjective:    Patient ID: Heather [name], female    DOB: 04/13/1956, 67 y.o.   MRN: [id number]  HPI  Heather [name] is a very pleasant 67 y.o. female with a history of hypertension, CKD, hyperlipidemia, macular degeneration who presents today for ED follow up.  She presented to [facility] ED on 10/23/22 for palpitations, shortness of breath, dizziness, felt like she was going to pass out that occurred 2 weeks prior while walking her dog. Her symptoms resolved about 45 minutes later. On 10/22/22 she woke up with palpitations, her husband listed to her heart, symptoms lasted for about 30 minutes. She called our office and was instructed by Access Nurse to present to the ED.  During her ED stay she underwent ECG which showed sinus bradycardia, otherwise normal. Serial Troponin levels were negative. Chest xray was negative. Lab including D-dimer, CBC, thyroid studies were negative. She was discharged home later that day.  Since her ED visit she's feeling better. She denies any symptoms since her ED visit. She denies palpitations, fatigue, diaphoresis, dizziness.   She has a significant family history of heart disease and early CAD in both parents and her sister. She is compliant to her losartan 50 mg and atenolol 25 mg daily. She does not check BP at home.     BP Readings from Last 3 Encounters:  10/27/22 (!) 160/92  10/23/22 (!) 185/86  01/20/22 128/72     Review of Systems  Constitutional:  Negative for fatigue.  Respiratory:  Negative for shortness of breath.   Cardiovascular:  Positive for leg swelling. Negative for chest pain.  Neurological:  Positive for dizziness.         Past Medical History:  Diagnosis Date   B12 deficiency    Chronic kidney disease    COVID-19 virus infection 06/20/2021   Depression    Dog bite 05/03/2021   GERD (gastroesophageal reflux disease)    Hyperlipidemia    Hypertension    had ankle swelling with amlodipine   Infection of urinary tract  12/16/2014   RLS (restless legs syndrome)     Social History   Socioeconomic History   Marital status: Married    Spouse name: Not on file   Number of children: 1   Years of education: Not on file   Highest education level: Not on file  Occupational History   Occupation: retired Ship broker: Retired   Occupation: HR (part-time)    Employer: [employer]  Tobacco Use   Smoking status: Never   Smokeless tobacco: Never  Vaping Use   Vaping Use: Never used  Substance and Sexual Activity   Alcohol use: No   Drug use: No   Sexual activity: Not on file  Other Topics Concern   Not on file  Social History Narrative   Works part-time in [employer] for [employer] in [employer] in [place] Married   Social Determinants of Health   Financial Resource Strain: [place]  (01/05/2022)   Overall Financial Resource Strain (CARDIA)    Difficulty of Paying Living Expenses: Not hard at all  Food Insecurity: No Food Insecurity (01/05/2022)   Hunger Vital Sign    Worried About Running Out of Food in the Last Year: Never true    [place] in the Last Year: Never true  Transportation Needs: No Transportation Needs (01/05/2022)   PRAPARE - Hydrologist (Medical): No    Lack of Transportation (Non-Medical):  No  Physical Activity: Sufficiently Active (01/05/2022)   Exercise Vital Sign    Days of Exercise per Week: 4 days    Minutes of Exercise per Session: 50 min  Stress: No Stress Concern Present (01/05/2022)   [place]    Feeling of Stress : Only a little  Social Connections: Moderately Integrated (01/05/2022)   Social Connection and Isolation Panel [NHANES]    Frequency of Communication with Friends and Family: More than three times a week    Frequency of Social Gatherings with Friends and Family: Three times a week    Attends Religious Services: More than 4 times per year     Active Member of Clubs or Organizations: No    Attends Archivist Meetings: Never    Marital Status: Married  Human resources officer Violence: Not At Risk (01/05/2022)   Humiliation, Afraid, Rape, and Kick questionnaire    Fear of Current or Ex-Partner: No    Emotionally Abused: No    Physically Abused: No    Sexually Abused: No    Past Surgical History:  Procedure Laterality Date   CATARACT EXTRACTION, BILATERAL  2021   SALPINGOOPHORECTOMY     right; laproscopic    Family History  Problem Relation Age of Onset   Hypertension Mother    Heart attack Mother 84       sudden cardiac death   COPD Father    Heart attack Father 16       developed ischemic cardiomyopathy   Heart disease Sister     Allergies  Allergen Reactions   Amoxicillin Diarrhea   Augmentin [Amoxicillin-Pot Clavulanate] Diarrhea    Current Outpatient Medications on File Prior to Visit  Medication Sig Dispense Refill   acetaminophen (TYLENOL) 325 MG tablet Take 650 mg by mouth every 6 (six) hours as needed.     atenolol (TENORMIN) 25 MG tablet Take 1 tablet by mouth once daily for blood pressure 90 tablet 1   DULoxetine (CYMBALTA) 30 MG capsule TAKE 1 CAPSULE BY MOUTH ONCE DAILY FOR ANXIETY AND FOR DEPRESSION 90 capsule 1   losartan (COZAAR) 50 MG tablet Take 1 tablet by mouth once daily for blood pressure 90 tablet 1   omeprazole (PRILOSEC) 20 MG capsule Take 1 capsule (20 mg total) by mouth 2 (two) times daily. 180 capsule 3   simvastatin (ZOCOR) 40 MG tablet TAKE 1 TABLET BY MOUTH AT BEDTIME FOR CHOLESTEROL 90 tablet 1   Current Facility-Administered Medications on File Prior to Visit  Medication Dose Route Frequency Provider Last Rate Last Admin   cyanocobalamin ((VITAMIN B-12)) injection 1,000 mcg  1,000 mcg Intramuscular Once Pleas Koch, NP        BP (!) 160/92   Pulse (!) 56   Temp 98.6 F (37 C) (Temporal)   Ht 5' 6"$  (1.676 m)   Wt 149 lb (67.6 kg)   SpO2 95%   BMI 24.05 kg/m   Objective:   Physical Exam Cardiovascular:     Rate and Rhythm: Normal rate and regular rhythm.  Pulmonary:     Effort: Pulmonary effort is normal.     Breath sounds: Normal breath sounds.  Musculoskeletal:     Cervical back: Neck supple.  Skin:    General: Skin is warm and dry.           Assessment & Plan:  Palpitations Assessment & Plan: Reviewed ED notes, labs, imaging.   Referral placed to cardiology to evaluate  for atrial fibrillation and for CAD risk assessment given her significant family history of CAD. Start aspirin 81 mg daily.  We will also have her track her BP at home, report readings to me via MyChart in 1 week.  Exam today benign.   Orders: -     Ambulatory referral to Cardiology  Essential hypertension Assessment & Plan: Above goal today, also during recent ED visits.  We will have her start checking BP at home, report readings in 1 week.  Continue atenolol 25 mg daily and losartan 50 mg daily for now.         Pleas Koch, NP

## 2022-10-27 NOTE — Assessment & Plan Note (Signed)
Above goal today, also during recent ED visits.  We will have her start checking BP at home, report readings in 1 week.  Continue atenolol 25 mg daily and losartan 50 mg daily for now.

## 2022-10-30 ENCOUNTER — Telehealth: Payer: Self-pay

## 2022-10-30 NOTE — Telephone Encounter (Signed)
     Patient  visit on 2/5  at [facility]   Have you been able to follow up with your primary care physician? Yes   The patient was or was not able to obtain any needed medicine or equipment. Yes   Are there diet recommendations that you are having difficulty following? Na   Patient expresses understanding of discharge instructions and education provided has no other needs at this time.  Yes      Heather [name] Pop Health Care Guide, [place] [phone] [address], [place], [place] [address] Phone: [phone] Email: [email or web address]    

## 2022-11-03 DIAGNOSIS — I10 Essential (primary) hypertension: Secondary | ICD-10-CM

## 2022-11-03 MED ORDER — LOSARTAN POTASSIUM 100 MG PO TABS
100.0000 mg | ORAL_TABLET | Freq: Every day | ORAL | 0 refills | Status: DC
Start: 2022-11-03 — End: 2022-12-06

## 2022-11-15 DIAGNOSIS — L570 Actinic keratosis: Secondary | ICD-10-CM | POA: Diagnosis not present

## 2022-11-15 DIAGNOSIS — L821 Other seborrheic keratosis: Secondary | ICD-10-CM | POA: Diagnosis not present

## 2022-11-15 DIAGNOSIS — L814 Other melanin hyperpigmentation: Secondary | ICD-10-CM | POA: Diagnosis not present

## 2022-11-24 ENCOUNTER — Encounter: Payer: Self-pay | Admitting: Cardiovascular Disease

## 2022-11-24 ENCOUNTER — Ambulatory Visit: Payer: Medicare PPO | Attending: Cardiovascular Disease | Admitting: Cardiovascular Disease

## 2022-11-24 VITALS — BP 132/88 | HR 69 | Ht 66.0 in | Wt 150.4 lb

## 2022-11-24 DIAGNOSIS — I10 Essential (primary) hypertension: Secondary | ICD-10-CM

## 2022-11-24 DIAGNOSIS — Z01810 Encounter for preprocedural cardiovascular examination: Secondary | ICD-10-CM

## 2022-11-24 DIAGNOSIS — E782 Mixed hyperlipidemia: Secondary | ICD-10-CM | POA: Diagnosis not present

## 2022-11-24 DIAGNOSIS — I200 Unstable angina: Secondary | ICD-10-CM | POA: Insufficient documentation

## 2022-11-24 MED ORDER — NITROGLYCERIN 0.4 MG SL SUBL: SUBLINGUAL_TABLET | SUBLINGUAL | 3 refills | Status: AC

## 2022-11-24 MED ORDER — ASPIRIN 81 MG PO TBEC: 81.0000 mg | DELAYED_RELEASE_TABLET | Freq: Every day | ORAL | 3 refills | Status: DC

## 2022-11-24 MED ORDER — ISOSORBIDE MONONITRATE ER 30 MG PO TB24: 30.0000 mg | ORAL_TABLET | Freq: Every day | ORAL | 3 refills | Status: DC

## 2022-11-24 MED ORDER — ROSUVASTATIN CALCIUM 20 MG PO TABS: 20.0000 mg | ORAL_TABLET | Freq: Every day | ORAL | 3 refills | Status: DC

## 2022-11-24 NOTE — H&P (View-Only) (Signed)
Cardiology Office Note:    Date:  11/24/2022   ID:  Heather [name], DOB October 24, 1955, MRN [id number]  PCP:  Pleas Koch, NP   [facility] Providers Cardiologist:  Nardos Putnam  Click to update primary MD,subspecialty MD or APP then REFRESH:1}    Referring MD: Pleas Koch, NP   Chief Complaint  Patient presents with   Palpitations   Chest Pain     History of Present Illness:    Heather [name] is a 67 y.o. female with a hx of CKD, HLD, HTN and palpitations   Seen with husband , Heather [name]  Has had 2 episodes of palpitations 1 - walking her dog,  developed tachypalpitations and chest burning  Felt presyncopal  Was very short of breath ( not typical for her after walking )  Lasted 30-45 minutes ,  Seemed to resolve gradually   2nd episode,  she woke up with tachy palpitations Heather [name] thought her HR was ok  Lasted 30-45 min Went to [facility] ER  Saw Allie Bossier, NP,  was hypertensive  Eats a healthy diet Walks regularly ,  perhaps an hour each day  Fam hx:   Mother had MI at age at 39 Father had MI at age 90 ,  Had CABG x 2,  2 strokes.  had ICD later in life  Sister had carotid stent at age 75,  had coronary stenting , also has a renal stent   Her symptoms are c/w  for unstable angina.  She has a very strong family history of coronary artery disease. Will set her up for heart catheterization.  Will start on aspirin 81 mg a day, nitroglycerin as needed, isosorbide 30 mg a day.  We have discussed the risk, benefits, options concerning heart catheterization.  She understands and agrees to proceed.  Past Medical History:  Diagnosis Date   B12 deficiency    Chronic kidney disease    COVID-19 virus infection 06/20/2021   Depression    Dog bite 05/03/2021   GERD (gastroesophageal reflux disease)    Hyperlipidemia    Hypertension    had ankle swelling with amlodipine   Infection of urinary tract 12/16/2014   Near syncope    Palpitations    RLS (restless legs  syndrome)     Past Surgical History:  Procedure Laterality Date   CATARACT EXTRACTION, BILATERAL  2021   SALPINGOOPHORECTOMY     right; laproscopic    Current Medications: Current Meds  Medication Sig   acetaminophen (TYLENOL) 325 MG tablet Take 650 mg by mouth every 6 (six) hours as needed.   aspirin EC 81 MG tablet Take 1 tablet (81 mg total) by mouth daily. Swallow whole.   atenolol (TENORMIN) 25 MG tablet Take 1 tablet by mouth once daily for blood pressure   DULoxetine (CYMBALTA) 30 MG capsule TAKE 1 CAPSULE BY MOUTH ONCE DAILY FOR ANXIETY AND FOR DEPRESSION   isosorbide mononitrate (IMDUR) 30 MG 24 hr tablet Take 1 tablet (30 mg total) by mouth daily.   losartan (COZAAR) 100 MG tablet Take 1 tablet (100 mg total) by mouth daily. for blood pressure.   Multiple Vitamins-Minerals (PRESERVISION AREDS 2 PO) Take by mouth.   nitroGLYCERIN (NITROSTAT) 0.4 MG SL tablet Dissolve 1 tablet under the tongue every 5 minutes for up to 3 doses for chest pain, then call 911   omeprazole (PRILOSEC) 20 MG capsule Take 1 capsule (20 mg total) by mouth 2 (two) times daily.   rosuvastatin (CRESTOR)  20 MG tablet Take 1 tablet (20 mg total) by mouth daily.   [DISCONTINUED] simvastatin (ZOCOR) 40 MG tablet TAKE 1 TABLET BY MOUTH AT BEDTIME FOR CHOLESTEROL   Current Facility-Administered Medications for the 11/24/22 encounter (Office Visit) with Yavier Snider, Wonda Cheng, MD  Medication   cyanocobalamin ((VITAMIN B-12)) injection 1,000 mcg     Allergies:   Amoxicillin and Augmentin [amoxicillin-pot clavulanate]   Social History   Socioeconomic History   Marital status: Married    Spouse name: Not on file   Number of children: 1   Years of education: Not on file   Highest education level: Not on file  Occupational History   Occupation: retired Ship broker: Retired   Occupation: HR (part-time)    Employer: [employer]  Tobacco Use   Smoking status: Never   Smokeless tobacco: Never   Vaping Use   Vaping Use: Never used  Substance and Sexual Activity   Alcohol use: No   Drug use: No   Sexual activity: Not on file  Other Topics Concern   Not on file  Social History Narrative   Works part-time in [employer] for [employer] in [employer] in [place] Married   Social Determinants of Health   Financial Resource Strain: [place]  (01/05/2022)   Overall Financial Resource Strain (CARDIA)    Difficulty of Paying Living Expenses: Not hard at all  Food Insecurity: No Food Insecurity (01/05/2022)   Hunger Vital Sign    Worried About Running Out of Food in the Last Year: Never true    [place] in the Last Year: Never true  Transportation Needs: No Transportation Needs (01/05/2022)   PRAPARE - Hydrologist (Medical): No    Lack of Transportation (Non-Medical): No  Physical Activity: Sufficiently Active (01/05/2022)   Exercise Vital Sign    Days of Exercise per Week: 4 days    Minutes of Exercise per Session: 50 min  Stress: No Stress Concern Present (01/05/2022)   [place]    Feeling of Stress : Only a little  Social Connections: Moderately Integrated (01/05/2022)   Social Connection and Isolation Panel [NHANES]    Frequency of Communication with Friends and Family: More than three times a week    Frequency of Social Gatherings with Friends and Family: Three times a week    Attends Religious Services: More than 4 times per year    Active Member of Clubs or Organizations: No    Attends Archivist Meetings: Never    Marital Status: Married     Family History: The patient's family history includes COPD in her father; Heart attack (age of onset: 30) in her father; Heart attack (age of onset: 55) in her mother; Heart disease in her sister; Hypertension in her mother.  ROS:   Please see the history of present illness.     All other systems reviewed and are  negative.  EKGs/Labs/Other Studies Reviewed:    The following studies were reviewed today:   EKG:   October 23, 2022: Sinus bradycardia at 55.  No ST or T wave changes.  Recent Labs: 01/20/2022: ALT 15 10/23/2022: BUN 22; Creatinine, Ser 1.07; Hemoglobin 12.3; Platelets 223; Potassium 4.1; Sodium 142; TSH 0.987  Recent Lipid Panel    Component Value Date/Time   CHOL 148 01/20/2022 1107   TRIG 62.0 01/20/2022 1107   HDL 46.70 01/20/2022 1107   CHOLHDL 3  01/20/2022 1107   VLDL 12.4 01/20/2022 1107   LDLCALC 89 01/20/2022 1107   LDLDIRECT 177.8 01/21/2013 1015     Risk Assessment/Calculations:                Physical Exam:    VS:  BP 132/88   Pulse 69   Ht '5\' 6"'$  (1.676 m)   Wt 150 lb 6.4 oz (68.2 kg)   SpO2 99%   BMI 24.28 kg/m     Wt Readings from Last 3 Encounters:  11/24/22 150 lb 6.4 oz (68.2 kg)  10/27/22 149 lb (67.6 kg)  10/23/22 145 lb (65.8 kg)     GEN:  Well nourished, well developed in no acute distress HEENT: Normal NECK: No JVD; No carotid bruits LYMPHATICS: No lymphadenopathy CARDIAC: RRR, no murmurs, rubs, gallops RESPIRATORY:  Clear to auscultation without rales, wheezing or rhonchi  ABDOMEN: Soft, non-tender, non-distended MUSCULOSKELETAL:  No edema; No deformity  SKIN: Warm and dry NEUROLOGIC:  Alert and oriented x 3 PSYCHIATRIC:  Normal affect   ASSESSMENT:    1. Essential hypertension   2. Unstable angina ([place])   3. Mixed hyperlipidemia   4. Pre-procedural cardiovascular examination    PLAN:    In order of problems listed above:  Unstable angina: Tisa presents with 2 separate episodes of what sound like unstable angina.  She had 1 with exercise but it lasted about 30 minutes.  She has also woken up in the middle of night with chest pressure/burning.   Her symptoms are c/w  for unstable angina.  She has a very strong family history of coronary artery disease. Will set her up for heart catheterization.  Will start on aspirin 81  mg a day, nitroglycerin as needed, isosorbide 30 mg a day.  We have discussed the risk, benefits, options concerning heart catheterization.  She understands and agrees to proceed.  2.  Hypertension: Will start her on isosorbide 30 mg a day in addition to her current losartan and atenolol Needs to watch her salt   3.  Hyperlipidemia :   LDL is 89 ( on Simva 40) Will DC simva, start Rosuvastatin 20 mg a day   Follow up in several weeks with me  ER precautions given       Shared Decision Making/Informed Consent{  The risks [stroke (1 in 1000), death (1 in 1000), kidney failure [usually temporary] (1 in 500), bleeding (1 in 200), allergic reaction [possibly serious] (1 in 200)], benefits (diagnostic support and management of coronary artery disease) and alternatives of a cardiac catheterization were discussed in detail with Heather [name] and she is willing to proceed.    Medication Adjustments/Labs and Tests Ordered: Current medicines are reviewed at length with the patient today.  Concerns regarding medicines are outlined above.  Orders Placed This Encounter  Procedures   Basic metabolic panel   CBC   Lipoprotein A (LPA)   Lipid panel   ALT   Meds ordered this encounter  Medications   rosuvastatin (CRESTOR) 20 MG tablet    Sig: Take 1 tablet (20 mg total) by mouth daily.    Dispense:  90 tablet    Refill:  3   aspirin EC 81 MG tablet    Sig: Take 1 tablet (81 mg total) by mouth daily. Swallow whole.    Dispense:  90 tablet    Refill:  3   nitroGLYCERIN (NITROSTAT) 0.4 MG SL tablet    Sig: Dissolve 1 tablet under the tongue every 5  minutes for up to 3 doses for chest pain, then call 911    Dispense:  75 tablet    Refill:  3   isosorbide mononitrate (IMDUR) 30 MG 24 hr tablet    Sig: Take 1 tablet (30 mg total) by mouth daily.    Dispense:  90 tablet    Refill:  3    Patient Instructions  Medication Instructions:  STOP Simvastation Start Rosuvastatin '20mg'$  daily, aspirin  '81mg'$  daily, isosorbide '30mg'$  daily, nitro as needed for chest pain *If you need a refill on your cardiac medications before your next appointment, please call your pharmacy*   Lab Work: BMET, CBC, Lipids, ALT, and Lp(A) TODAY If you have labs (blood work) drawn today and your tests are completely normal, you will receive your results only by: [place] (if you have MyChart) OR A paper copy in the mail If you have any lab test that is abnormal or we need to change your treatment, we will call you to review the results.   Testing/Procedures: Left heart catheterization Your physician has requested that you have a cardiac catheterization. Cardiac catheterization is used to diagnose and/or treat various heart conditions. Doctors may recommend this procedure for a number of different reasons. The most common reason is to evaluate chest pain. Chest pain can be a symptom of coronary artery disease (CAD), and cardiac catheterization can show whether plaque is narrowing or blocking your heart's arteries. This procedure is also used to evaluate the valves, as well as measure the blood flow and oxygen levels in different parts of your heart. For further information please visit HugeFiesta.tn. Please follow instruction sheet, as given.     Follow-Up: At [facility], you and your health needs are our priority.  As part of our continuing mission to provide you with exceptional heart care, we have created designated Provider Care Teams.  These Care Teams include your primary Cardiologist (physician) and Advanced Practice Providers (APPs -  Physician Assistants and Nurse Practitioners) who all work together to provide you with the care you need, when you need it.   Your next appointment:   4 week(s)  Provider:   Mertie Moores, MD   Other  [facility] A DEPT OF [facility] [place], [address] [id number] [place] [address] Dept: [phone] Loc: [facility]  11/24/2022  You are scheduled for a Cardiac Catheterization on Wednesday, March 13 with Dr. Sherren Mocha.  1. Please arrive at the [facility] (Main Entrance A) at [facility]: [address] [place], [place] [address] at 9:30 AM (This time is two hours before your procedure to ensure your preparation). Free valet parking service is available.   Special note: Every effort is made to have your procedure done on time. Please understand that emergencies sometimes delay scheduled procedures.  2. Diet: Do not eat solid foods after midnight.  The patient may have clear liquids until 5am upon the day of the procedure.  3. Labs: You will need to have blood drawn TODAY 4. Medication instructions in preparation for your procedure:   Contrast Allergy: No  DO NOT TAKE Losartan the day before and the day of procedure!   On the morning of your procedure, take your  and any morning medicines NOT listed above.  You may use sips of water.  5. Plan for one night stay--bring personal belongings. 6. Bring a current list  of your medications and current insurance cards. 7. You MUST have a responsible person to drive you home. 8. Someone MUST be with you the first 24 hours after you arrive home or your discharge will be delayed. 9. Please wear clothes that are easy to get on and off and wear slip-on shoes.  Thank you for allowing [place] to care for you!   -- [facility] Invasive Cardiovascular services   Signed, Mertie Moores, MD  11/24/2022 5:50 PM    [place]

## 2022-11-24 NOTE — Patient Instructions (Signed)
Medication Instructions:  STOP Simvastation Start Rosuvastatin '20mg'$  daily, aspirin '81mg'$  daily, isosorbide '30mg'$  daily, nitro as needed for chest pain *If you need a refill on your cardiac medications before your next appointment, please call your pharmacy*   Lab Work: BMET, CBC, Lipids, ALT, and Lp(A) TODAY If you have labs (blood work) drawn today and your tests are completely normal, you will receive your results only by: [place] (if you have MyChart) OR A paper copy in the mail If you have any lab test that is abnormal or we need to change your treatment, we will call you to review the results.   Testing/Procedures: Left heart catheterization Your physician has requested that you have a cardiac catheterization. Cardiac catheterization is used to diagnose and/or treat various heart conditions. Doctors may recommend this procedure for a number of different reasons. The most common reason is to evaluate chest pain. Chest pain can be a symptom of coronary artery disease (CAD), and cardiac catheterization can show whether plaque is narrowing or blocking your heart's arteries. This procedure is also used to evaluate the valves, as well as measure the blood flow and oxygen levels in different parts of your heart. For further information please visit HugeFiesta.tn. Please follow instruction sheet, as given.     Follow-Up: At [facility], you and your health needs are our priority.  As part of our continuing mission to provide you with exceptional heart care, we have created designated Provider Care Teams.  These Care Teams include your primary Cardiologist (physician) and Advanced Practice Providers (APPs -  Physician Assistants and Nurse Practitioners) who all work together to provide you with the care you need, when you need it.   Your next appointment:   4 week(s)  Provider:   Mertie Moores, MD   Other  [facility] A DEPT OF [place], [place] [id number] [place] [address] Dept: [phone] Loc: [place]  11/24/2022  You are scheduled for a Cardiac Catheterization on Wednesday, March 13 with Dr. Sherren Mocha.  1. Please arrive at the [facility] (Main Entrance A) at [facility]: [address] [place], [place] [address] at 9:30 AM (This time is two hours before your procedure to ensure your preparation). Free valet parking service is available.   Special note: Every effort is made to have your procedure done on time. Please understand that emergencies sometimes delay scheduled procedures.  2. Diet: Do not eat solid foods after midnight.  The patient may have clear liquids until 5am upon the day of the procedure.  3. Labs: You will need to have blood drawn TODAY 4. Medication instructions in preparation for your procedure:   Contrast Allergy: No  DO NOT TAKE Losartan the day before and the day of procedure!   On the morning of your procedure, take your  and any morning medicines NOT listed above.  You may use sips of water.  5. Plan for one night stay--bring personal belongings. 6. Bring a current list of your medications and current insurance cards. 7. You MUST have a responsible person to drive you home. 8. Someone MUST be with you the first 24 hours after you arrive home or your discharge will be delayed. 9. Please wear clothes that are easy to get on and off and wear slip-on shoes.  Thank you for allowing [place] to care for you!   -- [facility] Invasive Cardiovascular services

## 2022-11-24 NOTE — Progress Notes (Signed)
Cardiology Office Note:    Date:  11/24/2022   ID:  Heather [name], DOB [birth year], MRN [id number]  PCP:  Pleas Koch, NP   [place] Providers Cardiologist:  Bennie Chirico  Click to update primary MD,subspecialty MD or APP then REFRESH:1}    Referring MD: Pleas Koch, NP   Chief Complaint  Patient presents with   Palpitations   Chest Pain     History of Present Illness:    Heather [name] is a 67 y.o. female with a hx of CKD, HLD, HTN and palpitations   Seen with husband , [name]  Has had 2 episodes of palpitations 1 - walking her dog,  developed tachypalpitations and chest burning  Felt presyncopal  Was very short of breath ( not typical for her after walking )  Lasted 30-45 minutes ,  Seemed to resolve gradually   2nd episode,  she woke up with tachy palpitations [name] thought her HR was ok  Lasted 30-45 min Went to [facility] ER  Saw Allie Bossier, NP,  was hypertensive  Eats a healthy diet Walks regularly ,  perhaps an hour each day  Fam hx:   Mother had MI at age at 60 Father had MI at age 44 ,  Had CABG x 2,  2 strokes.  had ICD later in life  Sister had carotid stent at age 66,  had coronary stenting , also has a renal stent   Her symptoms are c/w  for unstable angina.  She has a very strong family history of coronary artery disease. Will set her up for heart catheterization.  Will start on aspirin 81 mg a day, nitroglycerin as needed, isosorbide 30 mg a day.  We have discussed the risk, benefits, options concerning heart catheterization.  She understands and agrees to proceed.  Past Medical History:  Diagnosis Date   B12 deficiency    Chronic kidney disease    COVID-19 virus infection 06/20/2021   Depression    Dog bite 05/03/2021   GERD (gastroesophageal reflux disease)    Hyperlipidemia    Hypertension    had ankle swelling with amlodipine   Infection of urinary tract 12/16/2014   Near syncope    Palpitations    RLS (restless legs  syndrome)     Past Surgical History:  Procedure Laterality Date   CATARACT EXTRACTION, BILATERAL  2021   SALPINGOOPHORECTOMY     right; laproscopic    Current Medications: Current Meds  Medication Sig   acetaminophen (TYLENOL) 325 MG tablet Take 650 mg by mouth every 6 (six) hours as needed.   aspirin EC 81 MG tablet Take 1 tablet (81 mg total) by mouth daily. Swallow whole.   atenolol (TENORMIN) 25 MG tablet Take 1 tablet by mouth once daily for blood pressure   DULoxetine (CYMBALTA) 30 MG capsule TAKE 1 CAPSULE BY MOUTH ONCE DAILY FOR ANXIETY AND FOR DEPRESSION   isosorbide mononitrate (IMDUR) 30 MG 24 hr tablet Take 1 tablet (30 mg total) by mouth daily.   losartan (COZAAR) 100 MG tablet Take 1 tablet (100 mg total) by mouth daily. for blood pressure.   Multiple Vitamins-Minerals (PRESERVISION AREDS 2 PO) Take by mouth.   nitroGLYCERIN (NITROSTAT) 0.4 MG SL tablet Dissolve 1 tablet under the tongue every 5 minutes for up to 3 doses for chest pain, then call 911   omeprazole (PRILOSEC) 20 MG capsule Take 1 capsule (20 mg total) by mouth 2 (two) times daily.   rosuvastatin (CRESTOR)  20 MG tablet Take 1 tablet (20 mg total) by mouth daily.   [DISCONTINUED] simvastatin (ZOCOR) 40 MG tablet TAKE 1 TABLET BY MOUTH AT BEDTIME FOR CHOLESTEROL   Current Facility-Administered Medications for the 11/24/22 encounter (Office Visit) with Kaila Devries, Wonda Cheng, MD  Medication   cyanocobalamin ((VITAMIN B-12)) injection 1,000 mcg     Allergies:   Amoxicillin and Augmentin [amoxicillin-pot clavulanate]   Social History   Socioeconomic History   Marital status: Married    Spouse name: Not on file   Number of children: 1   Years of education: Not on file   Highest education level: Not on file  Occupational History   Occupation: retired Ship broker: Retired   Occupation: HR (part-time)    Employer: [employer]  Tobacco Use   Smoking status: Never   Smokeless tobacco: Never   Vaping Use   Vaping Use: Never used  Substance and Sexual Activity   Alcohol use: No   Drug use: No   Sexual activity: Not on file  Other Topics Concern   Not on file  Social History Narrative   Works part-time in [employer] for [employer] in [employer] in [place] Married   Social Determinants of Health   Financial Resource Strain: [place]  (01/05/2022)   Overall Financial Resource Strain (CARDIA)    Difficulty of Paying Living Expenses: Not hard at all  Food Insecurity: No Food Insecurity (01/05/2022)   Hunger Vital Sign    Worried About Running Out of Food in the Last Year: Never true    [place] in the Last Year: Never true  Transportation Needs: No Transportation Needs (01/05/2022)   PRAPARE - Hydrologist (Medical): No    Lack of Transportation (Non-Medical): No  Physical Activity: Sufficiently Active (01/05/2022)   Exercise Vital Sign    Days of Exercise per Week: 4 days    Minutes of Exercise per Session: 50 min  Stress: No Stress Concern Present (01/05/2022)   [place]    Feeling of Stress : Only a little  Social Connections: Moderately Integrated (01/05/2022)   Social Connection and Isolation Panel [NHANES]    Frequency of Communication with Friends and Family: More than three times a week    Frequency of Social Gatherings with Friends and Family: Three times a week    Attends Religious Services: More than 4 times per year    Active Member of Clubs or Organizations: No    Attends Archivist Meetings: Never    Marital Status: Married     Family History: The patient's family history includes COPD in her father; Heart attack (age of onset: 44) in her father; Heart attack (age of onset: 59) in her mother; Heart disease in her sister; Hypertension in her mother.  ROS:   Please see the history of present illness.     All other systems reviewed and are  negative.  EKGs/Labs/Other Studies Reviewed:    The following studies were reviewed today:   EKG:   October 23, 2022: Sinus bradycardia at 55.  No ST or T wave changes.  Recent Labs: 01/20/2022: ALT 15 10/23/2022: BUN 22; Creatinine, Ser 1.07; Hemoglobin 12.3; Platelets 223; Potassium 4.1; Sodium 142; TSH 0.987  Recent Lipid Panel    Component Value Date/Time   CHOL 148 01/20/2022 1107   TRIG 62.0 01/20/2022 1107   HDL 46.70 01/20/2022 1107   CHOLHDL 3  01/20/2022 1107   VLDL 12.4 01/20/2022 1107   LDLCALC 89 01/20/2022 1107   LDLDIRECT 177.8 01/21/2013 1015     Risk Assessment/Calculations:                Physical Exam:    VS:  BP 132/88   Pulse 69   Ht '5\' 6"'$  (1.676 m)   Wt 150 lb 6.4 oz (68.2 kg)   SpO2 99%   BMI 24.28 kg/m     Wt Readings from Last 3 Encounters:  11/24/22 150 lb 6.4 oz (68.2 kg)  10/27/22 149 lb (67.6 kg)  10/23/22 145 lb (65.8 kg)     GEN:  Well nourished, well developed in no acute distress HEENT: Normal NECK: No JVD; No carotid bruits LYMPHATICS: No lymphadenopathy CARDIAC: RRR, no murmurs, rubs, gallops RESPIRATORY:  Clear to auscultation without rales, wheezing or rhonchi  ABDOMEN: Soft, non-tender, non-distended MUSCULOSKELETAL:  No edema; No deformity  SKIN: Warm and dry NEUROLOGIC:  Alert and oriented x 3 PSYCHIATRIC:  Normal affect   ASSESSMENT:    1. Essential hypertension   2. Unstable angina ([place])   3. Mixed hyperlipidemia   4. Pre-procedural cardiovascular examination    PLAN:    In order of problems listed above:  Unstable angina: Tramya presents with 2 separate episodes of what sound like unstable angina.  She had 1 with exercise but it lasted about 30 minutes.  She has also woken up in the middle of night with chest pressure/burning.   Her symptoms are c/w  for unstable angina.  She has a very strong family history of coronary artery disease. Will set her up for heart catheterization.  Will start on aspirin 81  mg a day, nitroglycerin as needed, isosorbide 30 mg a day.  We have discussed the risk, benefits, options concerning heart catheterization.  She understands and agrees to proceed.  2.  Hypertension: Will start her on isosorbide 30 mg a day in addition to her current losartan and atenolol Needs to watch her salt   3.  Hyperlipidemia :   LDL is 89 ( on Simva 40) Will DC simva, start Rosuvastatin 20 mg a day   Follow up in several weeks with me  ER precautions given       Shared Decision Making/Informed Consent{  The risks [stroke (1 in 1000), death (1 in 1000), kidney failure [usually temporary] (1 in 500), bleeding (1 in 200), allergic reaction [possibly serious] (1 in 200)], benefits (diagnostic support and management of coronary artery disease) and alternatives of a cardiac catheterization were discussed in detail with Ms. Gullo and she is willing to proceed.    Medication Adjustments/Labs and Tests Ordered: Current medicines are reviewed at length with the patient today.  Concerns regarding medicines are outlined above.  Orders Placed This Encounter  Procedures   Basic metabolic panel   CBC   Lipoprotein A (LPA)   Lipid panel   ALT   Meds ordered this encounter  Medications   rosuvastatin (CRESTOR) 20 MG tablet    Sig: Take 1 tablet (20 mg total) by mouth daily.    Dispense:  90 tablet    Refill:  3   aspirin EC 81 MG tablet    Sig: Take 1 tablet (81 mg total) by mouth daily. Swallow whole.    Dispense:  90 tablet    Refill:  3   nitroGLYCERIN (NITROSTAT) 0.4 MG SL tablet    Sig: Dissolve 1 tablet under the tongue every 5  minutes for up to 3 doses for chest pain, then call 911    Dispense:  75 tablet    Refill:  3   isosorbide mononitrate (IMDUR) 30 MG 24 hr tablet    Sig: Take 1 tablet (30 mg total) by mouth daily.    Dispense:  90 tablet    Refill:  3    Patient Instructions  Medication Instructions:  STOP Simvastation Start Rosuvastatin '20mg'$  daily, aspirin  '81mg'$  daily, isosorbide '30mg'$  daily, nitro as needed for chest pain *If you need a refill on your cardiac medications before your next appointment, please call your pharmacy*   Lab Work: BMET, CBC, Lipids, ALT, and Lp(A) TODAY If you have labs (blood work) drawn today and your tests are completely normal, you will receive your results only by: [place] (if you have MyChart) OR A paper copy in the mail If you have any lab test that is abnormal or we need to change your treatment, we will call you to review the results.   Testing/Procedures: Left heart catheterization Your physician has requested that you have a cardiac catheterization. Cardiac catheterization is used to diagnose and/or treat various heart conditions. Doctors may recommend this procedure for a number of different reasons. The most common reason is to evaluate chest pain. Chest pain can be a symptom of coronary artery disease (CAD), and cardiac catheterization can show whether plaque is narrowing or blocking your heart's arteries. This procedure is also used to evaluate the valves, as well as measure the blood flow and oxygen levels in different parts of your heart. For further information please visit [facility]uge[facility]tn. Please follow instruction sheet, as given.     Follow-Up: At [facility], you and your health needs are our priority.  As part of our continuing mission to provide you with exceptional heart care, we have created designated Provider Care Teams.  These Care Teams include your primary Cardiologist (physician) and Advanced Practice Providers (APPs -  Physician Assistants and Nurse Practitioners) who all work together to provide you with the care you need, when you need it.   Your next appointment:   4 week(s)  Provider:   Mertie Moores, MD   Other  [facility] OF [facility] [place], [place] [id number] [place] [address] Dept: [phone] Loc: [place]  11/24/2022  You are scheduled for a Cardiac Catheterization on Wednesday, March 13 with Dr. Sherren Mocha.  1. Please arrive at the [facility] (Main Entrance A) at [facility]: [address] [place], [place] [address] at 9:30 AM (This time is two hours before your procedure to ensure your preparation). Free valet parking service is available.   Special note: Every effort is made to have your procedure done on time. Please understand that emergencies sometimes delay scheduled procedures.  2. Diet: Do not eat solid foods after midnight.  The patient may have clear liquids until 5am upon the day of the procedure.  3. Labs: You will need to have blood drawn TODAY 4. Medication instructions in preparation for your procedure:   Contrast Allergy: No  DO NOT TAKE Losartan the day before and the day of procedure!   On the morning of your procedure, take your  and any morning medicines NOT listed above.  You may use sips of water.  5. Plan for one night stay--bring personal belongings. 6. Bring a current list  of your medications and current insurance cards. 7. You MUST have a responsible person to drive you home. 8. Someone MUST be with you the first 24 hours after you arrive home or your discharge will be delayed. 9. Please wear clothes that are easy to get on and off and wear slip-on shoes.  Thank you for allowing [place] to care for you!   -- [facility] Invasive Cardiovascular services   Signed, Mertie Moores, MD  11/24/2022 5:50 PM    [place]

## 2022-11-27 ENCOUNTER — Telehealth: Payer: Self-pay | Admitting: *Deleted

## 2022-11-27 NOTE — Telephone Encounter (Signed)
Cardiac Catheterization scheduled at [facility] for: Wednesday November 29, 2022 11:30 AM Arrival time [place] Entrance A at: 9: 30 AM  Nothing to eat after midnight prior to procedure, clear liquids until 5 AM day of procedure.  Medication instructions: -Hold:  Losartan-day before and day of procedure -per protocol GFR 53  Ibuprofen-day before and day of procedure -per protocol GFR 53 -Other usual morning medications can be taken with sips of water including aspirin 81 mg.  Confirmed patient has responsible adult to drive home post procedure and be with patient first 24 hours after arriving home.  Plan to go home the same day, you will only stay overnight if medically necessary.  Reviewed procedure instructions with patient.

## 2022-11-28 LAB — CBC
Hematocrit: 39.1 % (ref 34.0–46.6)
Hemoglobin: 12.8 g/dL (ref 11.1–15.9)
MCH: 29.8 pg (ref 26.6–33.0)
MCHC: 32.7 g/dL (ref 31.5–35.7)
MCV: 91 fL (ref 79–97)
Platelets: 221 10*3/uL (ref 150–450)
RBC: 4.29 x10E6/uL (ref 3.77–5.28)
RDW: 13.1 % (ref 11.7–15.4)
WBC: 5.3 10*3/uL (ref 3.4–10.8)

## 2022-11-28 LAB — ALT: ALT: 26 IU/L (ref 0–32)

## 2022-11-28 LAB — LIPID PANEL
Chol/HDL Ratio: 3.2 ratio (ref 0.0–4.4)
Cholesterol, Total: 189 mg/dL (ref 100–199)
HDL: 60 mg/dL (ref >39)
LDL Chol Calc (NIH): 113 mg/dL — ABNORMAL HIGH (ref 0–99)
Triglycerides: 91 mg/dL (ref 0–149)
VLDL Cholesterol Cal: 16 mg/dL (ref 5–40)

## 2022-11-28 LAB — LIPOPROTEIN A (LPA): Lipoprotein (a): 161.7 nmol/L — ABNORMAL HIGH (ref <75.0)

## 2022-11-28 LAB — BASIC METABOLIC PANEL
BUN/Creatinine Ratio: 17 (ref 12–28)
BUN: 19 mg/dL (ref 8–27)
CO2: 22 mmol/L (ref 20–29)
Calcium: 9.6 mg/dL (ref 8.7–10.3)
Chloride: 107 mmol/L — ABNORMAL HIGH (ref 96–106)
Creatinine, Ser: 1.13 mg/dL — ABNORMAL HIGH (ref 0.57–1.00)
Glucose: 98 mg/dL (ref 70–99)
Potassium: 4.4 mmol/L (ref 3.5–5.2)
Sodium: 146 mmol/L — ABNORMAL HIGH (ref 134–144)
eGFR: 53 mL/min/{1.73_m2} — ABNORMAL LOW (ref >59)

## 2022-11-29 ENCOUNTER — Ambulatory Visit (HOSPITAL_COMMUNITY)
Admission: RE | Admit: 2022-11-29 | Discharge: 2022-11-29 | Disposition: A | Discharge status: Home / Self Care | Payer: Medicare PPO | Attending: Cardiovascular Disease | Admitting: Cardiovascular Disease

## 2022-11-29 ENCOUNTER — Encounter (HOSPITAL_COMMUNITY)
Admission: RE | Disposition: A | Discharge status: Home / Self Care | Payer: Self-pay | Source: Home / Self Care | Attending: Cardiovascular Disease

## 2022-11-29 ENCOUNTER — Other Ambulatory Visit: Payer: Self-pay

## 2022-11-29 DIAGNOSIS — R079 Chest pain, unspecified: Secondary | ICD-10-CM | POA: Diagnosis present

## 2022-11-29 DIAGNOSIS — E782 Mixed hyperlipidemia: Secondary | ICD-10-CM | POA: Diagnosis not present

## 2022-11-29 DIAGNOSIS — Z8249 Family history of ischemic heart disease and other diseases of the circulatory system: Secondary | ICD-10-CM | POA: Insufficient documentation

## 2022-11-29 DIAGNOSIS — Z79899 Other long term (current) drug therapy: Secondary | ICD-10-CM | POA: Insufficient documentation

## 2022-11-29 DIAGNOSIS — R002 Palpitations: Secondary | ICD-10-CM | POA: Diagnosis not present

## 2022-11-29 DIAGNOSIS — I129 Hypertensive chronic kidney disease with stage 1 through stage 4 chronic kidney disease, or unspecified chronic kidney disease: Secondary | ICD-10-CM | POA: Diagnosis not present

## 2022-11-29 DIAGNOSIS — I25110 Atherosclerotic heart disease of native coronary artery with unstable angina pectoris: Secondary | ICD-10-CM | POA: Diagnosis not present

## 2022-11-29 DIAGNOSIS — I2510 Atherosclerotic heart disease of native coronary artery without angina pectoris: Secondary | ICD-10-CM

## 2022-11-29 DIAGNOSIS — N189 Chronic kidney disease, unspecified: Secondary | ICD-10-CM | POA: Insufficient documentation

## 2022-11-29 DIAGNOSIS — Z01810 Encounter for preprocedural cardiovascular examination: Secondary | ICD-10-CM

## 2022-11-29 DIAGNOSIS — I200 Unstable angina: Secondary | ICD-10-CM

## 2022-11-29 HISTORY — PX: LEFT HEART CATH AND CORONARY ANGIOGRAPHY: CATH118249

## 2022-11-29 SURGERY — LEFT HEART CATH AND CORONARY ANGIOGRAPHY
Anesthesia: LOCAL

## 2022-11-29 MED ORDER — SODIUM CHLORIDE 0.9 % WEIGHT BASED INFUSION: 1.0000 mL/kg/h | INTRAVENOUS | Status: DC

## 2022-11-29 MED ORDER — SODIUM CHLORIDE 0.9 % WEIGHT BASED INFUSION
3.0000 mL/kg/h | INTRAVENOUS | Status: AC
  Administered 2022-11-29: 3 mL/kg/h via INTRAVENOUS

## 2022-11-29 MED ORDER — SODIUM CHLORIDE 0.9% FLUSH: 3.0000 mL | INTRAVENOUS | Status: DC | PRN

## 2022-11-29 MED ORDER — SODIUM CHLORIDE 0.9 % IV SOLN: 250.0000 mL | INTRAVENOUS | Status: DC | PRN

## 2022-11-29 MED ORDER — MIDAZOLAM HCL 2 MG/2ML IJ SOLN
INTRAMUSCULAR | Status: AC
  Filled 2022-11-29: qty 2

## 2022-11-29 MED ORDER — SODIUM CHLORIDE 0.9% FLUSH: 3.0000 mL | Freq: Two times a day (BID) | INTRAVENOUS | Status: DC

## 2022-11-29 MED ORDER — FENTANYL CITRATE (PF) 100 MCG/2ML IJ SOLN
INTRAMUSCULAR | Status: DC | PRN
  Administered 2022-11-29: 25 ug via INTRAVENOUS

## 2022-11-29 MED ORDER — LABETALOL HCL 5 MG/ML IV SOLN: 10.0000 mg | INTRAVENOUS | Status: DC | PRN

## 2022-11-29 MED ORDER — ASPIRIN 81 MG PO CHEW: 81.0000 mg | CHEWABLE_TABLET | ORAL | Status: DC

## 2022-11-29 MED ORDER — LIDOCAINE HCL (PF) 1 % IJ SOLN
INTRAMUSCULAR | Status: DC | PRN
  Administered 2022-11-29: 2 mL via INTRADERMAL

## 2022-11-29 MED ORDER — HEPARIN SODIUM (PORCINE) 1000 UNIT/ML IJ SOLN
INTRAMUSCULAR | Status: AC
  Filled 2022-11-29: qty 10

## 2022-11-29 MED ORDER — VERAPAMIL HCL 2.5 MG/ML IV SOLN
INTRAVENOUS | Status: AC
  Filled 2022-11-29: qty 2

## 2022-11-29 MED ORDER — VERAPAMIL HCL 2.5 MG/ML IV SOLN
INTRAVENOUS | Status: DC | PRN
  Administered 2022-11-29: 10 mL via INTRA_ARTERIAL

## 2022-11-29 MED ORDER — LIDOCAINE HCL (PF) 1 % IJ SOLN
INTRAMUSCULAR | Status: AC
  Filled 2022-11-29: qty 30

## 2022-11-29 MED ORDER — HEPARIN (PORCINE) IN NACL 1000-0.9 UT/500ML-% IV SOLN
INTRAVENOUS | Status: DC | PRN
  Administered 2022-11-29 (×2): 500 mL

## 2022-11-29 MED ORDER — FENTANYL CITRATE (PF) 100 MCG/2ML IJ SOLN
INTRAMUSCULAR | Status: AC
  Filled 2022-11-29: qty 2

## 2022-11-29 MED ORDER — ACETAMINOPHEN 325 MG PO TABS: 650.0000 mg | ORAL_TABLET | ORAL | Status: DC | PRN

## 2022-11-29 MED ORDER — HEPARIN SODIUM (PORCINE) 1000 UNIT/ML IJ SOLN
INTRAMUSCULAR | Status: DC | PRN
  Administered 2022-11-29: 4000 [IU] via INTRAVENOUS

## 2022-11-29 MED ORDER — MIDAZOLAM HCL 2 MG/2ML IJ SOLN
INTRAMUSCULAR | Status: DC | PRN
  Administered 2022-11-29: 2 mg via INTRAVENOUS

## 2022-11-29 MED ORDER — HYDRALAZINE HCL 20 MG/ML IJ SOLN: 10.0000 mg | INTRAMUSCULAR | Status: DC | PRN

## 2022-11-29 MED ORDER — ONDANSETRON HCL 4 MG/2ML IJ SOLN: 4.0000 mg | Freq: Four times a day (QID) | INTRAMUSCULAR | Status: DC | PRN

## 2022-11-29 MED ORDER — IOHEXOL 350 MG/ML SOLN
INTRAVENOUS | Status: DC | PRN
  Administered 2022-11-29: 30 mL

## 2022-11-29 SURGICAL SUPPLY — 9 items

## 2022-11-29 NOTE — Interval H&P Note (Signed)
History and Physical Interval Note:  11/29/2022 1:00 PM  Heather [name]  has presented today for surgery, with the diagnosis of unstable angina.  The various methods of treatment have been discussed with the patient and family. After consideration of risks, benefits and other options for treatment, the patient has consented to  Procedure(s): LEFT HEART CATH AND CORONARY ANGIOGRAPHY (N/A) as a surgical intervention.  The patient's history has been reviewed, patient examined, no change in status, stable for surgery.  I have reviewed the patient's chart and labs.  Questions were answered to the patient's satisfaction.     Sherren Mocha

## 2022-11-29 NOTE — Discharge Instructions (Signed)
Radial Site Care  This sheet gives you information about how to care for yourself after your procedure. Your health care provider may also give you more specific instructions. If you have problems or questions, contact your health care provider. What can I expect after the procedure? After the procedure, it is common to have: Bruising and tenderness at the catheter insertion area. Follow these instructions at home: Medicines Take over-the-counter and prescription medicines only as told by your health care provider. Insertion site care Follow instructions from your health care provider about how to take care of your insertion site. Make sure you: Wash your hands with soap and water before you remove your bandage (dressing). If soap and water are not available, use hand sanitizer. May remove dressing in 24 hours. Check your insertion site every day for signs of infection. Check for: Redness, swelling, or pain. Fluid or blood. Pus or a bad smell. Warmth. Do no take baths, swim, or use a hot tub for 5 days. You may shower 24-48 hours after the procedure. Remove the dressing and gently wash the site with plain soap and water. Pat the area dry with a clean towel. Do not rub the site. That could cause bleeding. Do not apply powder or lotion to the site. Activity  For 24 hours after the procedure, or as directed by your health care provider: Do not flex or bend the affected arm. Do not push or pull heavy objects with the affected arm. Do not drive yourself home from the hospital or clinic. You may drive 24 hours after the procedure. Do not operate machinery or power tools. KEEP ARM ELEVATED THE REMAINDER OF THE DAY. Do not push, pull or lift anything that is heavier than 10 lb for 5 days. Ask your health care provider when it is okay to: Return to work or school. Resume usual physical activities or sports. Resume sexual activity. General instructions If the catheter site starts to  bleed, raise your arm and put firm pressure on the site. If the bleeding does not stop, get help right away. This is a medical emergency. DRINK PLENTY OF FLUIDS FOR THE NEXT 2-3 DAYS. No alcohol consumption for 24 hours after receiving sedation. If you went home on the same day as your procedure, a responsible adult should be with you for the first 24 hours after you arrive home. Keep all follow-up visits as told by your health care provider. This is important. Contact a health care provider if: You have a fever. You have redness, swelling, or yellow drainage around your insertion site. Get help right away if: You have unusual pain at the radial site. The catheter insertion area swells very fast. The insertion area is bleeding, and the bleeding does not stop when you hold steady pressure on the area. Your arm or hand becomes pale, cool, tingly, or numb. These symptoms may represent a serious problem that is an emergency. Do not wait to see if the symptoms will go away. Get medical help right away. Call your local emergency services (911 in the [place] Do not drive yourself to the hospital. Summary After the procedure, it is common to have bruising and tenderness at the site. Follow instructions from your health care provider about how to take care of your radial site wound. Check the wound every day for signs of infection.  This information is not intended to replace advice given to you by your health care provider. Make sure you discuss any questions you have with   your health care provider. Document Revised: 10/10/2017 Document Reviewed: 10/10/2017 Elsevier Patient Education  2020 [employer]  

## 2022-11-30 ENCOUNTER — Encounter (HOSPITAL_COMMUNITY): Payer: Self-pay | Admitting: Cardiovascular Disease

## 2022-12-01 DIAGNOSIS — H353112 Nonexudative age-related macular degeneration, right eye, intermediate dry stage: Secondary | ICD-10-CM | POA: Diagnosis not present

## 2022-12-01 DIAGNOSIS — H353123 Nonexudative age-related macular degeneration, left eye, advanced atrophic without subfoveal involvement: Secondary | ICD-10-CM | POA: Diagnosis not present

## 2022-12-06 ENCOUNTER — Encounter: Payer: Self-pay | Admitting: Primary Care

## 2022-12-06 ENCOUNTER — Ambulatory Visit: Payer: Medicare PPO | Admitting: Primary Care

## 2022-12-06 VITALS — BP 194/100 | HR 57 | Temp 99.5°F | Ht 66.0 in | Wt 150.0 lb

## 2022-12-06 DIAGNOSIS — I10 Essential (primary) hypertension: Secondary | ICD-10-CM | POA: Diagnosis not present

## 2022-12-06 MED ORDER — AMLODIPINE BESYLATE-VALSARTAN 5-160 MG PO TABS: 1.0000 | ORAL_TABLET | Freq: Every day | ORAL | 0 refills | Status: DC

## 2022-12-06 NOTE — Progress Notes (Signed)
Subjective:    Patient ID: Heather [name], female    DOB: [birth year], 67 y.o.   MRN: [id number]  HPI  SHAKIRIA [name] is a very pleasant 67 y.o. female with a history of hypertension, unstable angina, CKD, hyperlipidemia, palpitations who presents today to discuss hypertension.  Currently managed on atenolol 25 mg daily for palpitations, losartan 100 mg daily, isosorbide mononitrate 30 mg daily.   Last evaluated by me in February 2024. She sent BP readings via MyChart a few weeks later with readings of 150's-160's/90's-100's so we increased her losartan to 100 mg daily.   Following with cardiology and underwent cardiac catheterization in March 2024 which revealed mild non-obstructive distal LAD stenosis, mild non obstructive plaque to RCA.   Today she discusses that she is not checking BP in about 1 month. She has experienced left frontal lobe headaches daily, daily fatigue and feels "wiped out by noon". She denies chest pain.   She has a significant family history of CAD.   BP Readings from Last 3 Encounters:  12/06/22 (!) 194/100  11/29/22 132/70  11/24/22 132/88        Review of Systems  Constitutional:  Positive for fatigue.  Respiratory:  Negative for shortness of breath.   Cardiovascular:  Negative for chest pain.  Neurological:  Positive for headaches.         Past Medical History:  Diagnosis Date   B12 deficiency    Chronic kidney disease    COVID-19 virus infection 06/20/2021   Depression    Dog bite 05/03/2021   GERD (gastroesophageal reflux disease)    Hyperlipidemia    Hypertension    had ankle swelling with amlodipine   Infection of urinary tract 12/16/2014   Near syncope    Palpitations    RLS (restless legs syndrome)     Social History   Socioeconomic History   Marital status: Married    Spouse name: Not on file   Number of children: 1   Years of education: Not on file   Highest education level: Not on file  Occupational History    Occupation: retired Ship broker: Retired   Occupation: HR (part-time)    Employer: [employer]  Tobacco Use   Smoking status: Never   Smokeless tobacco: Never  Vaping Use   Vaping Use: Never used  Substance and Sexual Activity   Alcohol use: No   Drug use: No   Sexual activity: Not on file  Other Topics Concern   Not on file  Social History Narrative   Works part-time in [employer] for [employer] in [employer] in [place] Married   Social Determinants of Health   Financial Resource Strain: [place]  (01/05/2022)   Overall Financial Resource Strain (CARDIA)    Difficulty of Paying Living Expenses: Not hard at all  Food Insecurity: No Food Insecurity (01/05/2022)   Hunger Vital Sign    Worried About Running Out of Food in the Last Year: Never true    [place] in the Last Year: Never true  Transportation Needs: No Transportation Needs (01/05/2022)   PRAPARE - Hydrologist (Medical): No    Lack of Transportation (Non-Medical): No  Physical Activity: Sufficiently Active (01/05/2022)   Exercise Vital Sign    Days of Exercise per Week: 4 days    Minutes of Exercise per Session: 50 min  Stress: No Stress Concern Present (01/05/2022)   [place] -  Occupational Stress Questionnaire    Feeling of Stress : Only a little  Social Connections: Moderately Integrated (01/05/2022)   Social Connection and Isolation Panel [NHANES]    Frequency of Communication with Friends and Family: More than three times a week    Frequency of Social Gatherings with Friends and Family: Three times a week    Attends Religious Services: More than 4 times per year    Active Member of Clubs or Organizations: No    Attends Archivist Meetings: Never    Marital Status: Married  Human resources officer Violence: Not At Risk (01/05/2022)   Humiliation, Afraid, Rape, and Kick questionnaire    Fear of Current or Ex-Partner: No     Emotionally Abused: No    Physically Abused: No    Sexually Abused: No    Past Surgical History:  Procedure Laterality Date   CATARACT EXTRACTION, BILATERAL  2021   LEFT HEART CATH AND CORONARY ANGIOGRAPHY N/A 11/29/2022   Procedure: LEFT HEART CATH AND CORONARY ANGIOGRAPHY;  Surgeon: Sherren Mocha, MD;  Location: [facility];  Service: Cardiovascular;  Laterality: N/A;   SALPINGOOPHORECTOMY     right; laproscopic    Family History  Problem Relation Age of Onset   Hypertension Mother    Heart attack Mother 25       sudden cardiac death   COPD Father    Heart attack Father 43       developed ischemic cardiomyopathy   Heart disease Sister     Allergies  Allergen Reactions   Augmentin [Amoxicillin-Pot Clavulanate] Diarrhea    Current Outpatient Medications on File Prior to Visit  Medication Sig Dispense Refill   acetaminophen (TYLENOL) 500 MG tablet Take 1,000 mg by mouth every 6 (six) hours as needed for headache.     aspirin EC 81 MG tablet Take 1 tablet (81 mg total) by mouth daily. Swallow whole. 90 tablet 3   atenolol (TENORMIN) 25 MG tablet Take 1 tablet by mouth once daily for blood pressure 90 tablet 1   cyanocobalamin (VITAMIN B12) 1000 MCG tablet Take 1,000 mcg by mouth daily.     DULoxetine (CYMBALTA) 30 MG capsule TAKE 1 CAPSULE BY MOUTH ONCE DAILY FOR ANXIETY AND FOR DEPRESSION 90 capsule 1   ibuprofen (ADVIL) 200 MG tablet Take 400 mg by mouth every 6 (six) hours as needed for mild pain, moderate pain or headache.     isosorbide mononitrate (IMDUR) 30 MG 24 hr tablet Take 1 tablet (30 mg total) by mouth daily. 90 tablet 3   Multiple Vitamins-Minerals (PRESERVISION AREDS 2) CAPS Take 1 capsule by mouth daily.     nitroGLYCERIN (NITROSTAT) 0.4 MG SL tablet Dissolve 1 tablet under the tongue every 5 minutes for up to 3 doses for chest pain, then call 911 75 tablet 3   omeprazole (PRILOSEC) 20 MG capsule Take 1 capsule (20 mg total) by mouth 2 (two) times  daily. (Patient taking differently: Take 20 mg by mouth daily.) 180 capsule 3   rosuvastatin (CRESTOR) 20 MG tablet Take 1 tablet (20 mg total) by mouth daily. 90 tablet 3   simvastatin (ZOCOR) 40 MG tablet Take 40 mg by mouth daily. (Patient not taking: Reported on 12/06/2022)     Current Facility-Administered Medications on File Prior to Visit  Medication Dose Route Frequency Provider Last Rate Last Admin   cyanocobalamin ((VITAMIN B-12)) injection 1,000 mcg  1,000 mcg Intramuscular Once Pleas Koch, NP  BP (!) 194/100   Pulse (!) 57   Temp 99.5 F (37.5 C) (Temporal)   Ht 5\' 6"  (1.676 m)   Wt 150 lb (68 kg)   SpO2 97%   BMI 24.21 kg/m  Objective:   Physical Exam Cardiovascular:     Rate and Rhythm: Normal rate and regular rhythm.  Pulmonary:     Effort: Pulmonary effort is normal.     Breath sounds: Normal breath sounds.  Musculoskeletal:     Cervical back: Neck supple.  Skin:    General: Skin is warm and dry.           Assessment & Plan:  Essential hypertension Assessment & Plan: Uncontrolled and deteriorating. No red flag symptoms on exam today. Stable for outpatient treatment.   Reviewed blood pressure readings (flow sheets in Epic) from previous visits and emergency room visits.   Reviewed cardiology notes, labs and procedure notes.   Long discussion with patient about treatment options.  Stop Losartan 100 mg as the doubled dose has been ineffective.  Start amlodipine-valsartan 5-160mg  once daily.  Continue atenolol 25 mg daily and isosorbide mononitrate 30 mg daily.   Discussed with patient to continue checking blood pressure at home.   She will send manual blood pressure readings in one week via mychart.  Follow up with cardiology as scheduled.  I evaluated patient, was consulted regarding treatment, and agree with assessment and plan per Tinnie Gens, RN, DNP student.   Allie Bossier, NP-C   Orders: -     amLODIPine Besylate-Valsartan;  Take 1 tablet by mouth daily. For blood pressure.  Dispense: 30 tablet; Refill: 0        Pleas Koch, NP

## 2022-12-06 NOTE — Progress Notes (Signed)
Established Patient Office Visit  Subjective   Patient ID: Heather [name], female    DOB: 1956-01-26  Age: 67 y.o. MRN: [id number]  Chief Complaint  Patient presents with   Medical Management of Chronic Issues    BP f/u    HPI  Heather [name] is a 67 year old female with past medical history of hypertension, GERD, CKD, HLD, depression presents today for hypertension follow up.   She is currently managed on Atenolol 25 mg once daily, Losartan 100 mg once daily and isosorbide mononitrate 30 mg once daily.   She was evaluated by the cardiologist on 11/24/22, who added the isosorbide mononitrate 30 mg. She had a left heart catheterization on 11/29/22 which revealed widely patent left main, mild non-obstructive distal LAD stenosis, widely patent left circumflex, large, dominant RCA with mild nonobstructive plaquing and normal LVEDP. She was recommended for medical therapy for mild non-obstructive CAD. She is scheduled see Dr. Acie Fredrickson on April 1st.   She has been having daily headaches located in the left frontal lobe. The headaches are dull and she has not had to take medications daily. She does get fatigue easily. She denies any blurred vision, chest pain, unilateral weakness, shortness of breath or difficulty breathing. She does check her blood pressure at home with a blood pressure cuff and her husband takes it for her.   Does report getting fatigued quickly. Denies any visual blood in urine or stool.   Patient Active Problem List   Diagnosis Date Noted   Unstable angina ([place]) 11/24/2022   Palpitations 10/27/2022   GERD (gastroesophageal reflux disease) 01/20/2022   Cerumen impaction 01/05/2021   Urinary frequency 02/10/2020   Welcome to Medicare preventive visit 10/17/2019   Macular degeneration 10/11/2018   Preventative health care 10/11/2018   Hyperglycemia 10/11/2018   Liver mass 12/28/2015   Bowel incontinence 12/16/2014   HLD (hyperlipidemia) 08/24/2014   Depression 07/25/2012    Vaginal atrophy 01/13/2011   Chronic kidney disease    B12 deficiency    Essential hypertension 07/19/2010   Chest pain of uncertain etiology [id number]   Past Medical History:  Diagnosis Date   B12 deficiency    Chronic kidney disease    COVID-19 virus infection 06/20/2021   Depression    Dog bite 05/03/2021   GERD (gastroesophageal reflux disease)    Hyperlipidemia    Hypertension    had ankle swelling with amlodipine   Infection of urinary tract 12/16/2014   Near syncope    Palpitations    RLS (restless legs syndrome)    Past Surgical History:  Procedure Laterality Date   CATARACT EXTRACTION, BILATERAL  2021   LEFT HEART CATH AND CORONARY ANGIOGRAPHY N/A 11/29/2022   Procedure: LEFT HEART CATH AND CORONARY ANGIOGRAPHY;  Surgeon: Sherren Mocha, MD;  Location: [facility];  Service: Cardiovascular;  Laterality: N/A;   SALPINGOOPHORECTOMY     right; laproscopic   Social History   Tobacco Use   Smoking status: Never   Smokeless tobacco: Never  Vaping Use   Vaping Use: Never used  Substance Use Topics   Alcohol use: No   Drug use: No   Social History   Socioeconomic History   Marital status: Married    Spouse name: Not on file   Number of children: 1   Years of education: Not on file   Highest education level: Not on file  Occupational History   Occupation: retired Ship broker: Retired   Occupation:  HR (part-time)    Employer: [employer]  Tobacco Use   Smoking status: Never   Smokeless tobacco: Never  Vaping Use   Vaping Use: Never used  Substance and Sexual Activity   Alcohol use: No   Drug use: No   Sexual activity: Not on file  Other Topics Concern   Not on file  Social History Narrative   Works part-time in [employer] for [employer] in [employer] in [place] Married   Social Determinants of Health   Financial Resource Strain: [place]  (01/05/2022)   Overall Financial Resource Strain (CARDIA)    Difficulty of  Paying Living Expenses: Not hard at all  Food Insecurity: No Food Insecurity (01/05/2022)   Hunger Vital Sign    Worried About Running Out of Food in the Last Year: Never true    [place] in the Last Year: Never true  Transportation Needs: No Transportation Needs (01/05/2022)   PRAPARE - Hydrologist (Medical): No    Lack of Transportation (Non-Medical): No  Physical Activity: Sufficiently Active (01/05/2022)   Exercise Vital Sign    Days of Exercise per Week: 4 days    Minutes of Exercise per Session: 50 min  Stress: No Stress Concern Present (01/05/2022)   [place]    Feeling of Stress : Only a little  Social Connections: Moderately Integrated (01/05/2022)   Social Connection and Isolation Panel [NHANES]    Frequency of Communication with Friends and Family: More than three times a week    Frequency of Social Gatherings with Friends and Family: Three times a week    Attends Religious Services: More than 4 times per year    Active Member of Clubs or Organizations: No    Attends Archivist Meetings: Never    Marital Status: Married  Human resources officer Violence: Not At Risk (01/05/2022)   Humiliation, Afraid, Rape, and Kick questionnaire    Fear of Current or Ex-Partner: No    Emotionally Abused: No    Physically Abused: No    Sexually Abused: No   Family History  Problem Relation Age of Onset   Hypertension Mother    Heart attack Mother 56       sudden cardiac death   COPD Father    Heart attack Father 53       developed ischemic cardiomyopathy   Heart disease Sister    Allergies  Allergen Reactions   Augmentin [Amoxicillin-Pot Clavulanate] Diarrhea      Review of Systems  Eyes:  Negative for blurred vision.  Respiratory:  Negative for shortness of breath.   Cardiovascular:  Negative for chest pain.  Neurological:  Positive for headaches. Negative for dizziness.       Objective:     BP (!) 194/100   Pulse (!) 57   Temp 99.5 F (37.5 C) (Temporal)   Ht 5\' 6"  (1.676 m)   Wt 150 lb (68 kg)   SpO2 97%   BMI 24.21 kg/m  BP Readings from Last 3 Encounters:  12/06/22 (!) 194/100  11/29/22 132/70  11/24/22 132/88   Wt Readings from Last 3 Encounters:  12/06/22 150 lb (68 kg)  11/29/22 145 lb (65.8 kg)  11/24/22 150 lb 6.4 oz (68.2 kg)      Physical Exam Vitals and nursing note reviewed.  Constitutional:      Appearance: Normal appearance.  Cardiovascular:     Rate and Rhythm: Normal  rate and regular rhythm.     Pulses: Normal pulses.     Heart sounds: Normal heart sounds.  Pulmonary:     Effort: Pulmonary effort is normal.     Breath sounds: Normal breath sounds.  Neurological:     Mental Status: She is alert and oriented to person, place, and time.      No results found for any visits on 12/06/22.     The 10-year ASCVD risk score (Arnett DK, et al., 2019) is: 21.2%    Assessment & Plan:   Problem List Items Addressed This Visit       Cardiovascular and Mediastinum   Essential hypertension - Primary    Uncontrolled and deteriorating. No red flag symptoms on exam today. Stable for outpatient treatment.   Reviewed blood pressure readings from previous visits and emergency room visits.   Reviewed cardiology notes, labs and procedure notes.   Long discussion with patient about treatment options.  Stop Losartan 100 mg.  Start amlodipine-valsartan 5-160mg  once daily.  Continue atenolol 25 mg daily and isosorbide mononitrate 30 mg daily.   Discussed with patient to continue checking blood pressure at home.   She will send manual blood pressure readings in one week via mychart.  Follow up with cardiology as scheduled.      Relevant Medications   amLODipine-valsartan (EXFORGE) 5-160 MG tablet    No follow-ups on file.    Tinnie Gens, BSN-RN, DNP STUDENT

## 2022-12-06 NOTE — Patient Instructions (Addendum)
Stop losartan 100 mg.  Start amlodipine-valsartan 5-160 mg once daily.   Continue atenolol 25 mg and isorsorbide mononitrate 30 mg daily.   Please send manual blood pressure readings in one week via mychart.

## 2022-12-06 NOTE — Assessment & Plan Note (Addendum)
Uncontrolled and deteriorating. No red flag symptoms on exam today. Stable for outpatient treatment.   Reviewed blood pressure readings (flow sheets in Epic) from previous visits and emergency room visits.   Reviewed cardiology notes, labs and procedure notes.   Long discussion with patient about treatment options.  Stop Losartan 100 mg as the doubled dose has been ineffective.  Start amlodipine-valsartan 5-160mg  once daily.  Continue atenolol 25 mg daily and isosorbide mononitrate 30 mg daily.   Discussed with patient to continue checking blood pressure at home.   She will send manual blood pressure readings in one week via mychart.  Follow up with cardiology as scheduled.  I evaluated patient, was consulted regarding treatment, and agree with assessment and plan per Tinnie Gens, RN, DNP student.   Allie Bossier, NP-C

## 2022-12-17 ENCOUNTER — Encounter: Payer: Self-pay | Admitting: Cardiovascular Disease

## 2022-12-17 NOTE — Progress Notes (Unsigned)
Cardiology Office Note:    Date:  12/18/2022   ID:  Heather [name], DOB 03/14/1956, MRN [id number]  PCP:  Heather Koch, NP   [facility] Cardiologist:  Kaleyah Labreck  Click to update primary MD,subspecialty MD or APP then REFRESH:1}    Referring MD: Heather Koch, NP   Chief Complaint  Patient presents with   Hypertension          History of Present Illness:    Heather [name] is a 67 y.o. female with a hx of CKD, HLD, HTN and palpitations   Seen with husband , Heather [name]  Has had 2 episodes of palpitations 1 - walking her dog,  developed tachypalpitations and chest burning  Felt presyncopal  Was very short of breath ( not typical for her after walking )  Lasted 30-45 minutes ,  Seemed to resolve gradually   2nd episode,  she woke up with tachy palpitations Heather [name] thought her HR was ok  Lasted 30-45 min Went to [facility] ER  Saw Allie Bossier, NP,  was hypertensive  Eats a healthy diet Walks regularly ,  perhaps an hour each day  Fam hx:   Mother had MI at age at 22 Father had MI at age 66 ,  Had CABG x 2,  2 strokes.  had ICD later in life  Sister had carotid stent at age 75,  had coronary stenting , also has a renal stent   Her symptoms are c/w  for unstable angina.  She has a very strong family history of coronary artery disease. Will set her up for heart catheterization.  Will start on aspirin 81 mg a day, nitroglycerin as needed, isosorbide 30 mg a day.  We have discussed the risk, benefits, options concerning heart catheterization.  She understands and agrees to proceed.   December 18, 2022 Heather [name]  is seen for follow up of her chest pain / unstable angina symptoms Cath showed: normal LVEDP, non obstructive  CAD   Bp is elevated today  - Amlodipine 5 / valsartan 160 mg a day  BP readings at home  170 / 90s         Past Medical History:  Diagnosis Date   B12 deficiency    Chronic kidney disease    COVID-19 virus infection 06/20/2021    Depression    Dog bite 05/03/2021   GERD (gastroesophageal reflux disease)    Hyperlipidemia    Hypertension    had ankle swelling with amlodipine   Infection of urinary tract 12/16/2014   Near syncope    Palpitations    RLS (restless legs syndrome)     Past Surgical History:  Procedure Laterality Date   CATARACT EXTRACTION, BILATERAL  2021   LEFT HEART CATH AND CORONARY ANGIOGRAPHY N/A 11/29/2022   Procedure: LEFT HEART CATH AND CORONARY ANGIOGRAPHY;  Surgeon: Sherren Mocha, MD;  Location: [facility];  Service: Cardiovascular;  Laterality: N/A;   SALPINGOOPHORECTOMY     right; laproscopic    Current Medications: Current Meds  Medication Sig   acetaminophen (TYLENOL) 500 MG tablet Take 1,000 mg by mouth every 6 (six) hours as needed for headache.   amLODipine-valsartan (EXFORGE) 5-160 MG tablet Take 1 tablet by mouth daily. For blood pressure.   aspirin EC 81 MG tablet Take 1 tablet (81 mg total) by mouth daily. Swallow whole.   atenolol (TENORMIN) 25 MG tablet Take 1 tablet by mouth once daily for blood pressure   cyanocobalamin (VITAMIN  B12) 1000 MCG tablet Take 1,000 mcg by mouth daily.   DULoxetine (CYMBALTA) 30 MG capsule TAKE 1 CAPSULE BY MOUTH ONCE DAILY FOR ANXIETY AND FOR DEPRESSION   ibuprofen (ADVIL) 200 MG tablet Take 400 mg by mouth every 6 (six) hours as needed for mild pain, moderate pain or headache.   isosorbide mononitrate (IMDUR) 30 MG 24 hr tablet Take 1 tablet (30 mg total) by mouth daily.   Multiple Vitamins-Minerals (PRESERVISION AREDS 2) CAPS Take 1 capsule by mouth daily.   nitroGLYCERIN (NITROSTAT) 0.4 MG SL tablet Dissolve 1 tablet under the tongue every 5 minutes for up to 3 doses for chest pain, then call 911   omeprazole (PRILOSEC) 20 MG capsule Take 1 capsule (20 mg total) by mouth 2 (two) times daily. (Patient taking differently: Take 20 mg by mouth daily.)   rosuvastatin (CRESTOR) 20 MG tablet Take 1 tablet (20 mg total) by mouth daily.    spironolactone (ALDACTONE) 25 MG tablet Take 1 tablet (25 mg total) by mouth daily.   Current Facility-Administered Medications for the 12/18/22 encounter (Office Visit) with Prajwal Fellner, Wonda Cheng, MD  Medication   cyanocobalamin ((VITAMIN B-12)) injection 1,000 mcg     Allergies:   Augmentin [amoxicillin-pot clavulanate]   Social History   Socioeconomic History   Marital status: Married    Spouse name: Not on file   Number of children: 1   Years of education: Not on file   Highest education level: Not on file  Occupational History   Occupation: retired Ship broker: Retired   Occupation: HR (part-time)    Employer: [employer]  Tobacco Use   Smoking status: Never   Smokeless tobacco: Never  Vaping Use   Vaping Use: Never used  Substance and Sexual Activity   Alcohol use: No   Drug use: No   Sexual activity: Not on file  Other Topics Concern   Not on file  Social History Narrative   Works part-time in [employer] for [employer] in [employer] in [place] Married   Social Determinants of Health   Financial Resource Strain: [place]  (01/05/2022)   Overall Financial Resource Strain (CARDIA)    Difficulty of Paying Living Expenses: Not hard at all  Food Insecurity: No Food Insecurity (01/05/2022)   Hunger Vital Sign    Worried About Running Out of Food in the Last Year: Never true    [place] in the Last Year: Never true  Transportation Needs: No Transportation Needs (01/05/2022)   PRAPARE - Hydrologist (Medical): No    Lack of Transportation (Non-Medical): No  Physical Activity: Sufficiently Active (01/05/2022)   Exercise Vital Sign    Days of Exercise per Week: 4 days    Minutes of Exercise per Session: 50 min  Stress: No Stress Concern Present (01/05/2022)   [place]    Feeling of Stress : Only a little  Social Connections: Moderately Integrated  (01/05/2022)   Social Connection and Isolation Panel [NHANES]    Frequency of Communication with Friends and Family: More than three times a week    Frequency of Social Gatherings with Friends and Family: Three times a week    Attends Religious Services: More than 4 times per year    Active Member of Clubs or Organizations: No    Attends Archivist Meetings: Never    Marital Status: Married     Family History:  The patient's family history includes COPD in her father; Heart attack (age of onset: 52) in her father; Heart attack (age of onset: 53) in her mother; Heart disease in her sister; Hypertension in her mother.  ROS:   Please see the history of present illness.     All other systems reviewed and are negative.  EKGs/Labs/Other Studies Reviewed:    The following studies were reviewed today:   EKG:     Recent Labs: 10/23/2022: TSH 0.987 11/24/2022: ALT 26; BUN 19; Creatinine, Ser 1.13; Hemoglobin 12.8; Platelets 221; Potassium 4.4; Sodium 146  Recent Lipid Panel    Component Value Date/Time   CHOL 189 11/24/2022 0949   TRIG 91 11/24/2022 0949   HDL 60 11/24/2022 0949   CHOLHDL 3.2 11/24/2022 0949   CHOLHDL 3 01/20/2022 1107   VLDL 12.4 01/20/2022 1107   LDLCALC 113 (H) 11/24/2022 0949   LDLDIRECT 177.8 01/21/2013 1015     Risk Assessment/Calculations:        Physical Exam:    Physical Exam: Blood pressure (!) 165/95, pulse 66, height 5\' 6"  (1.676 m), weight 150 lb 12.8 oz (68.4 kg), SpO2 98 %.  HYPERTENSION CONTROL Vitals:   12/18/22 1339 12/18/22 1401  BP: (!) 170/88 (!) 165/95    The patient's blood pressure is elevated above target today.  In order to address the patient's elevated BP: A new medication was prescribed today.       GEN:  Well nourished, well developed in no acute distress HEENT: Normal NECK: No JVD; No carotid bruits LYMPHATICS: No lymphadenopathy CARDIAC: RRR , no murmurs, rubs, gallops RESPIRATORY:  Clear to auscultation  without rales, wheezing or rhonchi  ABDOMEN: Soft, non-tender, non-distended MUSCULOSKELETAL:  No edema; No deformity  SKIN: Warm and dry NEUROLOGIC:  Alert and oriented x 3   ASSESSMENT:    1. Medication management   2. DOE (dyspnea on exertion)   3. Chest pain of uncertain etiology     PLAN:       Chest tightness:   cath revealed only minimal CAD .  Her cp is not due to obstructive CAD Will work on BP control Check echo    2.  Hypertension:  remains elevated Add spironolactone 25 mg a day  Advised salt restriction More exercise    3.  Hyperlipidemia :     Stable      Shared Decision Making/Informed Consent{  The risks [stroke (1 in 1000), death (1 in 1000), kidney failure [usually temporary] (1 in 500), bleeding (1 in 200), allergic reaction [possibly serious] (1 in 200)], benefits (diagnostic support and management of coronary artery disease) and alternatives of a cardiac catheterization were discussed in detail with Heather [name] and she is willing to proceed.    Medication Adjustments/Labs and Tests Ordered: Current medicines are reviewed at length with the patient today.  Concerns regarding medicines are outlined above.  Orders Placed This Encounter  Procedures   Basic metabolic panel   AMB Referral to [facility] Pharm-D   ECHOCARDIOGRAM COMPLETE   Meds ordered this encounter  Medications   spironolactone (ALDACTONE) 25 MG tablet    Sig: Take 1 tablet (25 mg total) by mouth daily.    Dispense:  90 tablet    Refill:  3    Patient Instructions  Medication Instructions:  START Spironolactone 25mg  daily *If you need a refill on your cardiac medications before your next appointment, please call your pharmacy*   Lab Work: BMET in 1 month at PharmD visit  If you have labs (blood work) drawn today and your tests are completely normal, you will receive your results only by: [place] (if you have MyChart) OR A paper copy in the mail If you have any lab  test that is abnormal or we need to change your treatment, we will call you to review the results.   Testing/Procedures: Ambulatory referral to pharmD clinic for hypertension   ECHO Your physician has requested that you have an echocardiogram. Echocardiography is a painless test that uses sound waves to create images of your heart. It provides your doctor with information about the size and shape of your heart and how well your heart's chambers and valves are working. This procedure takes approximately one hour. There are no restrictions for this procedure. Please do NOT wear cologne, perfume, aftershave, or lotions (deodorant is allowed). Please arrive 15 minutes prior to your appointment time.  Follow-Up: At [facility], you and your health needs are our priority.  As part of our continuing mission to provide you with exceptional heart care, we have created designated Provider Care Teams.  These Care Teams include your primary Cardiologist (physician) and Advanced Practice Providers (APPs -  Physician Assistants and Nurse Practitioners) who all work together to provide you with the care you need, when you need it.  Your next appointment:   1 year(s)  Provider:   Mertie Moores, MD        Signed, Mertie Moores, MD  12/18/2022 5:20 PM    [place]

## 2022-12-18 ENCOUNTER — Ambulatory Visit: Payer: Medicare PPO | Attending: Cardiovascular Disease | Admitting: Cardiovascular Disease

## 2022-12-18 ENCOUNTER — Encounter: Payer: Self-pay | Admitting: Cardiovascular Disease

## 2022-12-18 VITALS — BP 165/95 | HR 66 | Ht 66.0 in | Wt 150.8 lb

## 2022-12-18 DIAGNOSIS — R079 Chest pain, unspecified: Secondary | ICD-10-CM

## 2022-12-18 DIAGNOSIS — R0609 Other forms of dyspnea: Secondary | ICD-10-CM

## 2022-12-18 DIAGNOSIS — Z79899 Other long term (current) drug therapy: Secondary | ICD-10-CM | POA: Diagnosis not present

## 2022-12-18 MED ORDER — SPIRONOLACTONE 25 MG PO TABS
25.0000 mg | ORAL_TABLET | Freq: Every day | ORAL | 3 refills | Status: DC
Start: 2022-12-18 — End: 2023-01-01

## 2022-12-18 NOTE — Patient Instructions (Signed)
Medication Instructions:  START Spironolactone 25mg  daily *If you need a refill on your cardiac medications before your next appointment, please call your pharmacy*   Lab Work: BMET in 1 month at PharmD visit If you have labs (blood work) drawn today and your tests are completely normal, you will receive your results only by: [place] (if you have MyChart) OR A paper copy in the mail If you have any lab test that is abnormal or we need to change your treatment, we will call you to review the results.   Testing/Procedures: Ambulatory referral to pharmD clinic for hypertension   ECHO Your physician has requested that you have an echocardiogram. Echocardiography is a painless test that uses sound waves to create images of your heart. It provides your doctor with information about the size and shape of your heart and how well your heart's chambers and valves are working. This procedure takes approximately one hour. There are no restrictions for this procedure. Please do NOT wear cologne, perfume, aftershave, or lotions (deodorant is allowed). Please arrive 15 minutes prior to your appointment time.  Follow-Up: At [facility], you and your health needs are our priority.  As part of our continuing mission to provide you with exceptional heart care, we have created designated Provider Care Teams.  These Care Teams include your primary Cardiologist (physician) and Advanced Practice Providers (APPs -  Physician Assistants and Nurse Practitioners) who all work together to provide you with the care you need, when you need it.  Your next appointment:   1 year(s)  Provider:   Mertie Moores, MD

## 2022-12-31 ENCOUNTER — Other Ambulatory Visit: Payer: Self-pay | Admitting: Primary Care

## 2022-12-31 DIAGNOSIS — I10 Essential (primary) hypertension: Secondary | ICD-10-CM

## 2022-12-31 NOTE — Telephone Encounter (Signed)
Please call patient:  How is her BP running? I reviewed her visit with cardiology and it looks like they added spironolactone for her BP.  Is she still taking amlodipine-valsartan daily for BP?

## 2023-01-01 NOTE — Telephone Encounter (Signed)
Patient scheduled.

## 2023-01-01 NOTE — Telephone Encounter (Signed)
Noted. I would like to see her in 3 months for her general follow up/CPE. Please schedule.

## 2023-01-01 NOTE — Telephone Encounter (Signed)
Called and spoke to patient she states her BP is running around 130s/80s. She tried taking the spironolactone but did not like how it made her feel (urinary frequency with only able to urinate small amounts at a time). She took that for a few days and then stopped it. She has just been taking the amlodipine-valsartan daily.

## 2023-01-08 ENCOUNTER — Ambulatory Visit (INDEPENDENT_AMBULATORY_CARE_PROVIDER_SITE_OTHER): Payer: Medicare PPO

## 2023-01-08 VITALS — Ht 66.0 in | Wt 145.0 lb

## 2023-01-08 DIAGNOSIS — Z0000 Encounter for general adult medical examination without abnormal findings: Secondary | ICD-10-CM

## 2023-01-08 NOTE — Patient Instructions (Signed)
Heather [name] , Thank you for taking time to come for your Medicare Wellness Visit. I appreciate your ongoing commitment to your health goals. Please review the following plan we discussed and let me know if I can assist you in the future.   These are the goals we discussed:  Goals      Patient Stated     01/04/2021, I will continue to walk 4 days a week for about 2 miles.      Patient Stated     No new goals.        This is a list of the screening recommended for you and due dates:  Health Maintenance  Topic Date Due   COVID-19 Vaccine (6 - 2023-24 season) 05/19/2022   Zoster (Shingles) Vaccine (1 of 2) 01/25/2023*   Flu Shot  04/19/2023   Medicare Annual Wellness Visit  01/08/2024   Mammogram  07/14/2024   Colon Cancer Screening  08/09/2024   DTaP/Tdap/Td vaccine (2 - Td or Tdap) 10/11/2028   Pneumonia Vaccine  Completed   DEXA scan (bone density measurement)  Completed   Hepatitis C Screening: USPSTF Recommendation to screen - Ages 80-79 yo.  Completed   HPV Vaccine  Aged Out  *Topic was postponed. The date shown is not the original due date.    Advanced directives: none  Conditions/risks identified: none  Next appointment: Follow up in one year for your annual wellness visit. 01/09/24 @ 11:15 telephone  Preventive Care 65 Years and Older, Female  Preventive care refers to lifestyle choices and visits with your health care provider that can promote health and wellness. What does preventive care include? A yearly physical exam. This is also called an annual well check. Dental exams once or twice a year. Routine eye exams. Ask your health care provider how often you should have your eyes checked. Personal lifestyle choices, including: Daily care of your teeth and gums. Regular physical activity. Eating a healthy diet. Avoiding tobacco and drug use. Limiting alcohol use. Practicing safe sex. Taking low doses of aspirin every day. Taking vitamin and mineral supplements  as recommended by your health care provider. What happens during an annual well check? The services and screenings done by your health care provider during your annual well check will depend on your age, overall health, lifestyle risk factors, and family history of disease. Counseling  Your health care provider may ask you questions about your: Alcohol use. Tobacco use. Drug use. Emotional well-being. Home and relationship well-being. Sexual activity. Eating habits. History of falls. Memory and ability to understand (cognition). Work and work Astronomer. Screening  You may have the following tests or measurements: Height, weight, and BMI. Blood pressure. Lipid and cholesterol levels. These may be checked every 5 years, or more frequently if you are over 43 years old. Skin check. Lung cancer screening. You may have this screening every year starting at age 68 if you have a 30-pack-year history of smoking and currently smoke or have quit within the past 15 years. Fecal occult blood test (FOBT) of the stool. You may have this test every year starting at age 14. Flexible sigmoidoscopy or colonoscopy. You may have a sigmoidoscopy every 5 years or a colonoscopy every 10 years starting at age 72. Prostate cancer screening. Recommendations will vary depending on your family history and other risks. Hepatitis C blood test. Hepatitis B blood test. Sexually transmitted disease (STD) testing. Diabetes screening. This is done by checking your blood sugar (glucose) after you have not  eaten for a while (fasting). You may have this done every 1-3 years. Abdominal aortic aneurysm (AAA) screening. You may need this if you are a current or former smoker. Osteoporosis. You may be screened starting at age 9 if you are at high risk. Talk with your health care provider about your test results, treatment options, and if necessary, the need for more tests. Vaccines  Your health care provider may recommend  certain vaccines, such as: Influenza vaccine. This is recommended every year. Tetanus, diphtheria, and acellular pertussis (Tdap, Td) vaccine. You may need a Td booster every 10 years. Zoster vaccine. You may need this after age 40. Pneumococcal 13-valent conjugate (PCV13) vaccine. One dose is recommended after age 39. Pneumococcal polysaccharide (PPSV23) vaccine. One dose is recommended after age 55. Talk to your health care provider about which screenings and vaccines you need and how often you need them. This information is not intended to replace advice given to you by your health care provider. Make sure you discuss any questions you have with your health care provider. Document Released: 10/01/2015 Document Revised: 05/24/2016 Document Reviewed: 07/06/2015 Elsevier Interactive Patient Education  2017 [place] Prevention in the Home Falls can cause injuries. They can happen to people of all ages. There are many things you can do to make your home safe and to help prevent falls. What can I do on the outside of my home? Regularly fix the edges of walkways and driveways and fix any cracks. Remove anything that might make you trip as you walk through a door, such as a raised step or threshold. Trim any bushes or trees on the path to your home. Use bright outdoor lighting. Clear any walking paths of anything that might make someone trip, such as rocks or tools. Regularly check to see if handrails are loose or broken. Make sure that both sides of any steps have handrails. Any raised decks and porches should have guardrails on the edges. Have any leaves, snow, or ice cleared regularly. Use sand or salt on walking paths during winter. Clean up any spills in your garage right away. This includes oil or grease spills. What can I do in the bathroom? Use night lights. Install grab bars by the toilet and in the tub and shower. Do not use towel bars as grab bars. Use non-skid mats or  decals in the tub or shower. If you need to sit down in the shower, use a plastic, non-slip stool. Keep the floor dry. Clean up any water that spills on the floor as soon as it happens. Remove soap buildup in the tub or shower regularly. Attach bath mats securely with double-sided non-slip rug tape. Do not have throw rugs and other things on the floor that can make you trip. What can I do in the bedroom? Use night lights. Make sure that you have a light by your bed that is easy to reach. Do not use any sheets or blankets that are too big for your bed. They should not hang down onto the floor. Have a firm chair that has side arms. You can use this for support while you get dressed. Do not have throw rugs and other things on the floor that can make you trip. What can I do in the kitchen? Clean up any spills right away. Avoid walking on wet floors. Keep items that you use a lot in easy-to-reach places. If you need to reach something above you, use a strong step stool that  has a grab bar. Keep electrical cords out of the way. Do not use floor polish or wax that makes floors slippery. If you must use wax, use non-skid floor wax. Do not have throw rugs and other things on the floor that can make you trip. What can I do with my stairs? Do not leave any items on the stairs. Make sure that there are handrails on both sides of the stairs and use them. Fix handrails that are broken or loose. Make sure that handrails are as long as the stairways. Check any carpeting to make sure that it is firmly attached to the stairs. Fix any carpet that is loose or worn. Avoid having throw rugs at the top or bottom of the stairs. If you do have throw rugs, attach them to the floor with carpet tape. Make sure that you have a light switch at the top of the stairs and the bottom of the stairs. If you do not have them, ask someone to add them for you. What else can I do to help prevent falls? Wear shoes that: Do not  have high heels. Have rubber bottoms. Are comfortable and fit you well. Are closed at the toe. Do not wear sandals. If you use a stepladder: Make sure that it is fully opened. Do not climb a closed stepladder. Make sure that both sides of the stepladder are locked into place. Ask someone to hold it for you, if possible. Clearly mark and make sure that you can see: Any grab bars or handrails. First and last steps. Where the edge of each step is. Use tools that help you move around (mobility aids) if they are needed. These include: Canes. Walkers. Scooters. Crutches. Turn on the lights when you go into a dark area. Replace any light bulbs as soon as they burn out. Set up your furniture so you have a clear path. Avoid moving your furniture around. If any of your floors are uneven, fix them. If there are any pets around you, be aware of where they are. Review your medicines with your doctor. Some medicines can make you feel dizzy. This can increase your chance of falling. Ask your doctor what other things that you can do to help prevent falls. This information is not intended to replace advice given to you by your health care provider. Make sure you discuss any questions you have with your health care provider. Document Released: 07/01/2009 Document Revised: 02/10/2016 Document Reviewed: 10/09/2014 Elsevier Interactive Patient Education  2017 [employer].

## 2023-01-08 NOTE — Progress Notes (Signed)
I connected with  [name] on 01/08/23 by a audio enabled telemedicine application and verified that I am speaking with the correct person using two identifiers.  Patient Location: Home  Provider Location: Home Office  I discussed the limitations of evaluation and management by telemedicine. The patient expressed understanding and agreed to proceed.  Subjective:   Heather [name] is a 67 y.o. female who presents for Medicare Annual (Subsequent) preventive examination.  Review of Systems      Cardiac Risk Factors include: advanced age (>47men, >36 women);hypertension     Objective:    Today's Vitals   01/08/23 1034  Weight: 145 lb (65.8 kg)  Height:  (1.676 m)   Body mass index is 23.4 kg/m.     01/08/2023   10:42 AM 11/29/2022    9:53 AM 10/23/2022   12:28 PM 01/05/2022   10:49 AM 07/11/2021    9:23 AM 01/04/2021   10:25 AM  Advanced Directives  Does Patient Have a Medical Advance Directive? No No No No No No  Would patient like information on creating a medical advance directive? No - Patient declined No - Patient declined No - Patient declined Yes (MAU/Ambulatory/Procedural Areas - Information given)  No - Patient declined    Current Medications (verified) Outpatient Encounter Medications as of 01/08/2023  Medication Sig   acetaminophen (TYLENOL) 500 MG tablet Take 1,000 mg by mouth every 6 (six) hours as needed for headache.   amLODipine-valsartan (EXFORGE) 5-160 MG tablet Take 1 tablet by mouth once daily for blood pressure   aspirin EC 81 MG tablet Take 1 tablet (81 mg total) by mouth daily. Swallow whole.   atenolol (TENORMIN) 25 MG tablet Take 1 tablet by mouth once daily for blood pressure   cyanocobalamin (VITAMIN B12) 1000 MCG tablet Take 1,000 mcg by mouth daily.   DULoxetine (CYMBALTA) 30 MG capsule TAKE 1 CAPSULE BY MOUTH ONCE DAILY FOR ANXIETY AND FOR DEPRESSION   ibuprofen (ADVIL) 200 MG tablet Take 400 mg by mouth every 6 (six) hours as needed for  mild pain, moderate pain or headache.   isosorbide mononitrate (IMDUR) 30 MG 24 hr tablet Take 1 tablet (30 mg total) by mouth daily.   Multiple Vitamins-Minerals (PRESERVISION AREDS 2) CAPS Take 1 capsule by mouth daily.   nitroGLYCERIN (NITROSTAT) 0.4 MG SL tablet Dissolve 1 tablet under the tongue every 5 minutes for up to 3 doses for chest pain, then call 911   omeprazole (PRILOSEC) 20 MG capsule Take 1 capsule (20 mg total) by mouth 2 (two) times daily. (Patient taking differently: Take 20 mg by mouth daily.)   rosuvastatin (CRESTOR) 20 MG tablet Take 1 tablet (20 mg total) by mouth daily.   simvastatin (ZOCOR) 40 MG tablet Take 40 mg by mouth daily. (Patient not taking: Reported on 12/18/2022)   Facility-Administered Encounter Medications as of 01/08/2023  Medication   cyanocobalamin ((VITAMIN B-12)) injection 1,000 mcg    Allergies (verified) Augmentin [amoxicillin-pot clavulanate]   History: Past Medical History:  Diagnosis Date   B12 deficiency    Chronic kidney disease    COVID-19 virus infection 06/20/2021   Depression    Dog bite 05/03/2021   GERD (gastroesophageal reflux disease)    Hyperlipidemia    Hypertension    had ankle swelling with amlodipine   Infection of urinary tract 12/16/2014   Near syncope    Palpitations    RLS (restless legs syndrome)    Past Surgical History:  Procedure Laterality Date  CATARACT EXTRACTION, BILATERAL  2021   LEFT HEART CATH AND CORONARY ANGIOGRAPHY N/A 11/29/2022   Procedure: LEFT HEART CATH AND CORONARY ANGIOGRAPHY;  Surgeon: Tonny Bollman, MD;  Location: [facility] INVASIVE CV LAB;  Service: Cardiovascular;  Laterality: N/A;   SALPINGOOPHORECTOMY     right; laproscopic   Family History  Problem Relation Age of Onset   Hypertension Mother    Heart attack Mother 41       sudden cardiac death   COPD Father    Heart attack Father 80       developed ischemic cardiomyopathy   Heart disease Sister    Social History   Socioeconomic  History   Marital status: Married    Spouse name: Not on file   Number of children: 1   Years of education: Not on file   Highest education level: Not on file  Occupational History   Occupation: retired Ambulance person: Retired   Occupation: HR (part-time)    Employer: [employer]  Tobacco Use   Smoking status: Never   Smokeless tobacco: Never  Vaping Use   Vaping Use: Never used  Substance and Sexual Activity   Alcohol use: No   Drug use: No   Sexual activity: Not on file  Other Topics Concern   Not on file  Social History Narrative   Works part-time in [employer] for [employer] in [employer] in [place] Married   Social Determinants of Health   Financial Resource Strain: Low Risk  (01/08/2023)   Overall Financial Resource Strain (CARDIA)    Difficulty of Paying Living Expenses: Not hard at all  Food Insecurity: No Food Insecurity (01/08/2023)   Hunger Vital Sign    Worried About Running Out of Food in the Last Year: Never true    Ran Out of Food in the Last Year: Never true  Transportation Needs: No Transportation Needs (01/08/2023)   PRAPARE - Administrator, Civil Service (Medical): No    Lack of Transportation (Non-Medical): No  Physical Activity: Sufficiently Active (01/08/2023)   Exercise Vital Sign    Days of Exercise per Week: 7 days    Minutes of Exercise per Session: 30 min  Stress: No Stress Concern Present (01/08/2023)   [employer] of Occupational Health - Occupational Stress Questionnaire    Feeling of Stress : Not at all  Social Connections: Moderately Integrated (01/08/2023)   Social Connection and Isolation Panel [NHANES]    Frequency of Communication with Friends and Family: More than three times a week    Frequency of Social Gatherings with Friends and Family: More than three times a week    Attends Religious Services: More than 4 times per year    Active Member of [employer] or Organizations: No    Attends Museum/gallery exhibitions officer: Never    Marital Status: Married    Tobacco Counseling Counseling given: Not Answered   Clinical Intake:  Pre-visit preparation completed: Yes  Pain : No/denies pain     Nutritional Risks: None Diabetes: No  How often do you need to have someone help you when you read instructions, pamphlets, or other written materials from your doctor or pharmacy?: 1 - Never  Diabetic? no  Interpreter Needed?: No  Information entered by :: C.Khyree Carillo LPN   Activities of Daily Living    01/08/2023   10:43 AM  In your present state of health, do you have any difficulty performing the following activities:  Hearing? 0  Vision?  0  Difficulty concentrating or making decisions? 0  Walking or climbing stairs? 0  Dressing or bathing? 0  Doing errands, shopping? 0  Preparing Food and eating ? N  Using the Toilet? N  In the past six months, have you accidently leaked urine? N  Do you have problems with loss of bowel control? N  Managing your Medications? N  Managing your Finances? N  Housekeeping or managing your Housekeeping? N    Patient Care Team: Doreene Nest, NP as PCP - General (Internal Medicine) Nahser, Deloris Ping, MD as PCP - Cardiology (Cardiology) Sharrell Ku, MD as Consulting Physician (Gastroenterology)  Indicate any recent Medical Services you may have received from other than [facility] providers in the past year (date may be approximate).     Assessment:   This is a routine wellness examination for [place].  Hearing/Vision screen Hearing Screening - Comments:: No aids Vision Screening - Comments:: Glasses - [facility]  Dietary issues and exercise activities discussed: Current Exercise Habits: Home exercise routine, Type of exercise: walking, Time (Minutes): 30, Frequency (Times/Week): 7, Weekly Exercise (Minutes/Week): 210, Exercise limited by: None identified   Goals Addressed             This Visit's Progress    Patient Stated        No new goals.       Depression Screen    01/08/2023   10:41 AM 10/27/2022   10:29 AM 01/20/2022   10:27 AM 01/05/2022   10:47 AM 06/20/2021    2:51 PM 01/04/2021   10:27 AM 10/17/2019   11:00 AM  PHQ 2/9 Scores  PHQ - 2 Score 0 0 0 0 0 0 0  PHQ- 9 Score  0    0     Fall Risk    01/08/2023   10:43 AM 10/27/2022   10:28 AM 01/20/2022   10:27 AM 01/05/2022   10:50 AM 06/20/2021    2:51 PM  Fall Risk   Falls in the past year? 1 1 1 1 1   Number falls in past yr: 0 0  0 0  Comment twisted ankle and fell      Injury with Fall? 0 0 1 1 1   Comment   elbow    Risk for fall due to : No Fall Risks History of fall(s) Impaired balance/gait No Fall Risks History of fall(s)  Follow up Falls prevention discussed;Education provided;Falls evaluation completed Falls evaluation completed Falls evaluation completed Falls prevention discussed Falls evaluation completed    FALL RISK PREVENTION PERTAINING TO THE HOME:  Any stairs in or around the home? No  If so, are there any without handrails? No  Home free of loose throw rugs in walkways, pet beds, electrical cords, etc? Yes  Adequate lighting in your home to reduce risk of falls? Yes   ASSISTIVE DEVICES UTILIZED TO PREVENT FALLS:  Life alert? No  Use of a cane, walker or w/c? No  Grab bars in the bathroom? No  Shower chair or bench in shower? Yes  Elevated toilet seat or a handicapped toilet? Yes    Cognitive Function:    01/04/2021   10:32 AM  MMSE - Mini Mental State Exam  Orientation to time 5  Orientation to Place 5  Registration 3  Attention/ Calculation 5  Recall 3  Language- repeat 1        01/08/2023   10:45 AM  6CIT Screen  What Year? 0 points  What month? 0 points  What time? 0 points  Count back from 20 0 points  Months in reverse 0 points  Repeat phrase 0 points  Total Score 0 points    Immunizations Immunization History  Administered Date(s) Administered   PFIZER Comirnaty(Gray Top)Covid-19 Tri-Sucrose  Vaccine 03/01/2021   PFIZER(Purple Top)SARS-COV-2 Vaccination 10/07/2019, 10/28/2019, 07/31/2020   PNEUMOCOCCAL CONJUGATE-20 01/20/2022   Pfizer Covid-19 Vaccine Bivalent Booster 27yrs & up 08/29/2021   Tdap 10/11/2018    TDAP status: Up to date  Flu Vaccine status: Declined, Education has been provided regarding the importance of this vaccine but patient still declined. Advised may receive this vaccine at local pharmacy or Health Dept. Aware to provide a copy of the vaccination record if obtained from local pharmacy or Health Dept. Verbalized acceptance and understanding.  Pneumococcal vaccine status: Up to date  Covid-19 vaccine status: Information provided on how to obtain vaccines.   Qualifies for Shingles Vaccine? Yes   Zostavax completed No   Shingrix Completed?: Yes  Screening Tests Health Maintenance  Topic Date Due   COVID-19 Vaccine (6 - 2023-24 season) 05/19/2022   Zoster Vaccines- Shingrix (1 of 2) 01/25/2023 (Originally 05/17/1975)   INFLUENZA VACCINE  04/19/2023   Medicare Annual Wellness (AWV)  01/08/2024   MAMMOGRAM  07/14/2024   COLONOSCOPY (Pts 45-81yrs Insurance coverage will need to be confirmed)  08/09/2024   DTaP/Tdap/Td (2 - Td or Tdap) 10/11/2028   Pneumonia Vaccine 42+ Years old  Completed   DEXA SCAN  Completed   Hepatitis C Screening  Completed   HPV VACCINES  Aged Out    Health Maintenance  Health Maintenance Due  Topic Date Due   COVID-19 Vaccine (6 - 2023-24 season) 05/19/2022    Colorectal cancer screening: Type of screening: Colonoscopy. Completed 08/09/21. Repeat every 3 years  Mammogram status: Completed 07/14/2022. Repeat every year  Bone Density status: Completed 07/14/22. Results reflect: Bone density results: OSTEOPENIA. Repeat every 2 years.  Lung Cancer Screening: (Low Dose CT Chest recommended if Age 6-80 years, 30 pack-year currently smoking OR have quit w/in 15years.) does not qualify.   Lung Cancer Screening Referral:  no  Additional Screening:  Hepatitis C Screening: does qualify; Completed 10/13/19  Vision Screening: Recommended annual ophthalmology exams for early detection of glaucoma and other disorders of the eye. Is the patient up to date with their annual eye exam?  Yes  Who is the provider or what is the name of the office in which the patient attends annual eye exams? [facility] If pt is not established with a provider, would they like to be referred to a provider to establish care? No .   Dental Screening: Recommended annual dental exams for proper oral hygiene  Community Resource Referral / Chronic Care Management: CRR required this visit?  No   CCM required this visit?  No      Plan:     I have personally reviewed and noted the following in the patient's chart:   Medical and social history Use of alcohol, tobacco or illicit drugs  Current medications and supplements including opioid prescriptions. Patient is not currently taking opioid prescriptions. Functional ability and status Nutritional status Physical activity Advanced directives List of other physicians Hospitalizations, surgeries, and ER visits in previous 12 months Vitals Screenings to include cognitive, depression, and falls Referrals and appointments  In addition, I have reviewed and discussed with patient certain preventive protocols, quality metrics, and best practice recommendations. A written personalized care plan for preventive services as well as general preventive  health recommendations were provided to patient.     Maryan Puls, LPN   1/61/0960   Nurse Notes: none

## 2023-01-11 ENCOUNTER — Ambulatory Visit (HOSPITAL_COMMUNITY): Payer: Medicare PPO | Attending: Internal Medicine

## 2023-01-11 DIAGNOSIS — Z79899 Other long term (current) drug therapy: Secondary | ICD-10-CM | POA: Insufficient documentation

## 2023-01-11 DIAGNOSIS — R079 Chest pain, unspecified: Secondary | ICD-10-CM | POA: Insufficient documentation

## 2023-01-11 DIAGNOSIS — R0609 Other forms of dyspnea: Secondary | ICD-10-CM | POA: Diagnosis not present

## 2023-01-12 ENCOUNTER — Ambulatory Visit: Payer: Medicare PPO

## 2023-01-12 ENCOUNTER — Ambulatory Visit: Payer: Medicare PPO | Attending: Cardiology | Admitting: Pharmacist

## 2023-01-12 VITALS — BP 148/86 | HR 53

## 2023-01-12 DIAGNOSIS — Z01810 Encounter for preprocedural cardiovascular examination: Secondary | ICD-10-CM

## 2023-01-12 DIAGNOSIS — E782 Mixed hyperlipidemia: Secondary | ICD-10-CM

## 2023-01-12 DIAGNOSIS — I10 Essential (primary) hypertension: Secondary | ICD-10-CM | POA: Diagnosis not present

## 2023-01-12 DIAGNOSIS — I200 Unstable angina: Secondary | ICD-10-CM | POA: Diagnosis not present

## 2023-01-12 LAB — ECHOCARDIOGRAM COMPLETE
Area-P 1/2: 3.2 cm2
S' Lateral: 2.9 cm

## 2023-01-12 MED ORDER — ROSUVASTATIN CALCIUM 10 MG PO TABS
10.0000 mg | ORAL_TABLET | Freq: Every day | ORAL | 3 refills | Status: DC
Start: 2023-01-12 — End: 2024-01-07

## 2023-01-12 NOTE — Patient Instructions (Addendum)
Decrease your rosuvastatin from 20mg  to 10mg  daily - monitor and see if your aches improve on the lower dose  Your blood pressure goal is < 130/34mmHg -Try to minimize ibuprofen use, this can increase your blood pressure. Tylenol is ok to use. -Continue on your current medications and monitor/record your blood pressure at home -Bring your log and cuff to your visit with your PCP  -We have room to increase the dose of your amlodipine-valsartan if needed     Important lifestyle changes to control high blood pressure  Intervention  Effect on the BP   Weight loss Weight loss is one of the most effective lifestyle changes for controlling blood pressure. If you're overweight or obese, losing even a small amount of weight can help reduce blood pressure.    Blood pressure can decrease by 1 millimeter of mercury (mmHg) with each kilogram (about 2.2 pounds) of weight lost.   Exercise regularly As a general goal, aim for 30 minutes of moderate physical activity every day.    Regular physical activity can lower blood pressure by 5 - 8 mmHg.   Eat a healthy diet Eat a diet rich in whole grains, fruits, vegetables, lean meat, and low-fat dairy products. Limit processed foods, saturated fat, and sweets.    A heart-healthy diet can lower high blood pressure by 10 mmHg.   Reduce salt (sodium) in your diet Aim for 000mg  of sodium each day. Avoid deli meats, canned food, and frozen microwave meals which are high in sodium.     Limiting sodium can reduce blood pressure by 5 mmHg.   Limit alcohol One drink equals 12 ounces of beer, 5 ounces of wine, or 1.5 ounces of 80-proof liquor.    Limiting alcohol to < 1 drink a day for women or < 2 drinks a day for men can help lower blood pressure by about 4 mmHg.   To check your pressure at home you will need to:   Sit up in a chair, with feet flat on the floor and back supported. Do not cross your ankles or legs. Rest your left arm so that the  cuff is about heart level. If the cuff goes on your upper arm, then just relax your arm on the table, arm of the chair, or your lap. If you have a wrist cuff, hold your wrist against your chest at heart level. Place the cuff snugly around your arm, about 1 inch above the crease of your elbow. The cords should be inside the groove of your elbow.  Sit quietly, with the cuff in place, for about 5 minutes. Then press the power button to start a reading. Do not talk or move while the reading is taking place.  Record your readings on a sheet of paper. Although most cuffs have a memory, it is often easier to see a pattern developing when the numbers are all in front of you.  You can repeat the reading after 1-3 minutes if it is recommended.   Make sure your bladder is empty and you have not had caffeine or tobacco within the last 30 minutes   Always bring your blood pressure log with you to your appointments. If you have not brought your monitor in to be double checked for accuracy, please bring it to your next appointment.   You can find a list of validated (accurate) blood pressure cuffs at: validatebp.org

## 2023-01-12 NOTE — Progress Notes (Unsigned)
Patient ID: Heather [name]                 DOB: 1955/09/19                      MRN: [id number]     HPI: Heather [name] is a 67 y.o. female referred by Dr. Elease Hashimoto to HTN clinic. PMH is significant for HTN, HLD, palpitations, and CKD 3.   Patient was admitted for unstable angina on 11/29/2022. Heart cath showed minimal CAD. She was started on aspirin 81 mg daily and Imdur at that time.   At visit with PCP on 12/06/2022, patient's BP was 194/100 mmHg and she reported severe headaches. She had been on losartan 100 mg daily, but this was discontinued in favor of amlodipine-valsartan 5-160 mg daily. Atenolol was continued. A week later, the patient reported home BP readings ~140s-170s/80s-90s mmHg.   At visit with Dr. Elease Hashimoto on 12/18/2022, patient's BP was 165/95 mmHg. Spironolactone 25 mg daily was added to her regimen. Patient tried the spironolactone but reported urinary frequency and stopped it. Patient also stated her home BP readings were ~130s/80s mmHg and she has only been taking amlodipine-valsartan recently. Echo from 01/11/2023 showed normal EF (60-65%).   Today, patient presents for further optimization of antihypertensive regimen. Patient confirms that she is taking amlodipine-valsartan, atenolol, and Imdur as prescribed. Patient states that the severe headaches that she was experiencing last month have completely resolved. She endorses occasional orthostasis and denies peripheral edema. Patient's home BP readings have been in the 120s-130s/70-80s mmHg range. She uses an upper arm cuff (has not been validated) and takes measurements in the evenings. In-office readings today were 146/82 mmHg and 148/86 mmHg on recheck.   Patient notes that since her husband recently had a stroke, they have both been watching what they eat by abstaining from fast/fried foods and excess sugar/salt. 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 She also notes that she has been taking Tylenol/ibuprofen for pain a few times per week. She recalls being on HCTZ combination products in the past, and states these were discontinued given her impaired kidney function. Patient was recently switched from simvastatin to rosuvastatin 20 mg daily for LLT, and she reports bilateral knee/hip pain since starting this.   Home  BP readings: ~120s-130s/70s-80s mmHg In-office readings: 146/82 mmHg > 148/86 mmHg.   Current HTN meds: amlodipine-valsartan 5-160 mg daily, atenolol 25 mg daily, Imdur 30 mg daily Previously tried: losartan-hydrochlorothiazide 50-12.5 mg daily, valsartan-hydrochlorothiazide 80-12.5 mg daily BP goal: < 130/80 mmHg  Family History: Mother had MI at age at 26; father had MI at age 41 with CABG x 2, two  strokes, had ICD later in life; sister had carotid stent at age 51, coronary and renal stenting.  Social History: never-smoker, no alcohol or illicit drug use.  Diet: Patient has cut down on fast/fried food, sugar, and does not add salt to her meals at home. She does not drink coffee, but does drink ~12 oz of Coke per day.   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 She is otherwise mostly sedentary.   Home BP readings:  Yesterday: 127/78 mmHg Averaging ~120s-130s/70s-80s mmHg  Wt Readings from Last 3 Encounters:  01/08/23 145 lb (65.8 kg)  12/18/22 150 lb 12.8 oz (68.4 kg)  12/06/22 150 lb (68 kg)  BP Readings from Last 3 Encounters:  12/18/22 (!) 165/95  12/06/22 (!) 194/100  11/29/22 132/70   Pulse Readings from Last 3 Encounters:  12/18/22 66  12/06/22 (!) 57  11/29/22 60    Renal function: CrCl cannot be calculated (Patient's most recent lab result is older than the maximum 21 days allowed.).  Past Medical History:  Diagnosis Date   B12 deficiency    Chronic kidney disease    COVID-19 virus infection 06/20/2021   Depression     Dog bite 05/03/2021   GERD (gastroesophageal reflux disease)    Hyperlipidemia    Hypertension    had ankle swelling with amlodipine   Infection of urinary tract 12/16/2014   Near syncope    Palpitations    RLS (restless legs syndrome)     Current Outpatient Medications on File Prior to Visit  Medication Sig Dispense Refill   acetaminophen (TYLENOL) 500 MG tablet Take 1,000 mg by mouth every 6 (six) hours as needed for headache.     amLODipine-valsartan (EXFORGE) 5-160 MG tablet Take 1 tablet by mouth once daily for blood pressure 90 tablet 0   aspirin EC 81 MG tablet Take 1 tablet (81 mg total) by mouth daily. Swallow whole. 90 tablet 3   atenolol (TENORMIN) 25 MG tablet Take 1 tablet by mouth once daily for blood pressure 90 tablet 1   cyanocobalamin (VITAMIN B12) 1000 MCG tablet Take 1,000 mcg by mouth daily.     DULoxetine (CYMBALTA) 30 MG capsule TAKE 1 CAPSULE BY MOUTH ONCE DAILY FOR ANXIETY AND FOR DEPRESSION 90 capsule 1   ibuprofen (ADVIL) 200 MG tablet Take 400 mg by mouth every 6 (six) hours as needed for mild pain, moderate pain or headache.     isosorbide mononitrate (IMDUR) 30 MG 24 hr tablet Take 1 tablet (30 mg total) by mouth daily. 90 tablet 3   Multiple Vitamins-Minerals (PRESERVISION AREDS 2) CAPS Take 1 capsule by mouth daily.     nitroGLYCERIN (NITROSTAT) 0.4 MG SL tablet Dissolve 1 tablet under the tongue every 5 minutes for up to 3 doses for chest pain, then call 911 75 tablet 3   omeprazole (PRILOSEC) 20 MG capsule Take 1 capsule (20 mg total) by mouth 2 (two) times daily. (Patient taking differently: Take 20 mg by mouth daily.) 180 capsule 3   rosuvastatin (CRESTOR) 20 MG tablet Take 1 tablet (20 mg total) by mouth daily. 90 tablet 3   simvastatin (ZOCOR) 40 MG tablet Take 40 mg by mouth daily. (Patient not taking: Reported on 12/18/2022)     Current Facility-Administered Medications on File Prior to Visit  Medication Dose Route Frequency Provider Last Rate Last  Admin   cyanocobalamin ((VITAMIN B-12)) injection 1,000 mcg  1,000 mcg Intramuscular Once Doreene Nest, NP        Allergies  Allergen Reactions   Augmentin [Amoxicillin-Pot Clavulanate] Diarrhea     Assessment/Plan:  1. Hypertension - Patient presents with home BP readings ~120s-130s/70s-80s mmHg and average in-office readings from today of 144/84 mmHg, which is above goal < 130/80 mmHg. Patient reports taking all of her medications as prescribed and endorses complete resolution of headaches she was having last month when BP control was significantly worse. Patient reports some orthoastasis and denies peripheral edema. She states that her stress has increased after her husband's stroke and that she is taking Tylenol/ibuprofen for pain a few times per week.  - Continue amlodipine-valsartan 5-160 mg daily, atenolol 25 mg daily, and Imdur 30  mg daily - Encouraged patient to continue eating heart-healthy diet by minimizing intake of salt/caffeine and to increase physical activity to help achieve and maintain BP goal - Advised patient to closely monitor BP at home and to bring in BP monitor with logged readings in to next visit with PCP on 01/23/2023. Will need to evaluate accuracy of home BP cuff given discrepancy between office readings and home readings. Can f/u with pt after if needed.  2. Hyperlipidemia -  Patient also endorses myalgias with rosuvastatin 20 mg daily, which was recently switched from simvastatin 40 mg daily. Will decrease rosuvastatin to 10 mg daily, pt encouraged to monitor for improvement in symptoms. She tolerated simvastatin well but this interacts with her amlodipine.  Patient seen with Michiel Cowboy, PharmD Candidate  Megan E. Supple, PharmD, BCACP, CPP [facility] [address], [place], [place] [address] Phone: [phone]; Fax: [phone] 01/12/2023 2:04 PM

## 2023-01-23 ENCOUNTER — Encounter: Payer: Self-pay | Admitting: Primary Care

## 2023-01-23 ENCOUNTER — Other Ambulatory Visit: Payer: Self-pay | Admitting: Primary Care

## 2023-01-23 ENCOUNTER — Ambulatory Visit (INDEPENDENT_AMBULATORY_CARE_PROVIDER_SITE_OTHER): Payer: Medicare PPO | Admitting: Primary Care

## 2023-01-23 VITALS — BP 132/78 | HR 61 | Temp 97.3°F | Ht 66.0 in | Wt 151.0 lb

## 2023-01-23 DIAGNOSIS — H6121 Impacted cerumen, right ear: Secondary | ICD-10-CM | POA: Diagnosis not present

## 2023-01-23 DIAGNOSIS — I10 Essential (primary) hypertension: Secondary | ICD-10-CM | POA: Diagnosis not present

## 2023-01-23 DIAGNOSIS — E782 Mixed hyperlipidemia: Secondary | ICD-10-CM

## 2023-01-23 DIAGNOSIS — N182 Chronic kidney disease, stage 2 (mild): Secondary | ICD-10-CM

## 2023-01-23 DIAGNOSIS — Z0000 Encounter for general adult medical examination without abnormal findings: Secondary | ICD-10-CM | POA: Diagnosis not present

## 2023-01-23 DIAGNOSIS — R002 Palpitations: Secondary | ICD-10-CM

## 2023-01-23 DIAGNOSIS — F3342 Major depressive disorder, recurrent, in full remission: Secondary | ICD-10-CM

## 2023-01-23 DIAGNOSIS — K219 Gastro-esophageal reflux disease without esophagitis: Secondary | ICD-10-CM

## 2023-01-23 DIAGNOSIS — H3530 Unspecified macular degeneration: Secondary | ICD-10-CM

## 2023-01-23 DIAGNOSIS — E538 Deficiency of other specified B group vitamins: Secondary | ICD-10-CM

## 2023-01-23 LAB — LIPID PANEL
Cholesterol: 143 mg/dL (ref 0–200)
HDL: 50.7 mg/dL (ref >39.00)
LDL Cholesterol: 69 mg/dL (ref 0–99)
NonHDL: 92.32
Total CHOL/HDL Ratio: 3
Triglycerides: 119 mg/dL (ref 0.0–149.0)
VLDL: 23.8 mg/dL (ref 0.0–40.0)

## 2023-01-23 LAB — COMPREHENSIVE METABOLIC PANEL
ALT: 11 U/L (ref 0–35)
AST: 14 U/L (ref 0–37)
Albumin: 4.2 g/dL (ref 3.5–5.2)
Alkaline Phosphatase: 63 U/L (ref 39–117)
BUN: 20 mg/dL (ref 6–23)
CO2: 29 mEq/L (ref 19–32)
Calcium: 9 mg/dL (ref 8.4–10.5)
Chloride: 106 mEq/L (ref 96–112)
Creatinine, Ser: 1.16 mg/dL (ref 0.40–1.20)
GFR: 48.38 mL/min — ABNORMAL LOW (ref >60.00)
Glucose, Bld: 95 mg/dL (ref 70–99)
Potassium: 4 mEq/L (ref 3.5–5.1)
Sodium: 142 mEq/L (ref 135–145)
Total Bilirubin: 1.2 mg/dL (ref 0.2–1.2)
Total Protein: 7.1 g/dL (ref 6.0–8.3)

## 2023-01-23 LAB — VITAMIN B12: Vitamin B-12: 1088 pg/mL — ABNORMAL HIGH (ref 211–911)

## 2023-01-23 NOTE — Progress Notes (Signed)
Subjective:    Patient ID: Heather [name], female    DOB: 05/10/1956, 67 y.o.   MRN: [id number]  HPI  Heather [name] is a very pleasant 67 y.o. female who presents today for complete physical and follow up of chronic conditions.  She has also noticed right ear fullness and questions if her ear is impacted with cerumen.   Immunizations: -Tetanus: Completed in 2020 -Shingles: Completed Shingrix series -Pneumonia: Completed Prevnar 20 in 2023  Diet: Fair diet.  Exercise: No regular exercise.  Eye exam: Completes annually  Dental exam: Completes semi-annually    Mammogram: October 2023 Bone Density Scan: October 2023  Colonoscopy: Completed in 2022, due 2025   BP Readings from Last 3 Encounters:  01/23/23 132/78  01/12/23 (!) 148/86  12/18/22 (!) 165/95      Review of Systems  Constitutional:  Negative for unexpected weight change.  HENT:  Negative for rhinorrhea.        Right ear fullness  Respiratory:  Negative for cough and shortness of breath.   Cardiovascular:  Negative for chest pain.  Gastrointestinal:  Negative for constipation and diarrhea.  Genitourinary:  Negative for difficulty urinating.  Musculoskeletal:  Negative for arthralgias and myalgias.  Skin:  Negative for rash.  Allergic/Immunologic: Negative for environmental allergies.  Neurological:  Negative for dizziness and headaches.  Psychiatric/Behavioral:  The patient is not nervous/anxious.          Past Medical History:  Diagnosis Date   B12 deficiency    Chronic kidney disease    COVID-19 virus infection 06/20/2021   Depression    Dog bite 05/03/2021   GERD (gastroesophageal reflux disease)    Hyperlipidemia    Hypertension    had ankle swelling with amlodipine   Infection of urinary tract 12/16/2014   Liver mass 12/28/2015   Near syncope    Palpitations    RLS (restless legs syndrome)    Welcome to Medicare preventive visit 10/17/2019    Social History   Socioeconomic  History   Marital status: Married    Spouse name: Not on file   Number of children: 1   Years of education: Not on file   Highest education level: Not on file  Occupational History   Occupation: retired Ambulance person: Retired   Occupation: HR (part-time)    Employer: [employer]  Tobacco Use   Smoking status: Never   Smokeless tobacco: Never  Vaping Use   Vaping Use: Never used  Substance and Sexual Activity   Alcohol use: No   Drug use: No   Sexual activity: Not on file  Other Topics Concern   Not on file  Social History Narrative   Works part-time in [employer] for [employer] in [employer] in [place] Married   Social Determinants of Health   Financial Resource Strain: Low Risk  (01/08/2023)   Overall Financial Resource Strain (CARDIA)    Difficulty of Paying Living Expenses: Not hard at all  Food Insecurity: No Food Insecurity (01/08/2023)   Hunger Vital Sign    Worried About Running Out of Food in the Last Year: Never true    Ran Out of Food in the Last Year: Never true  Transportation Needs: No Transportation Needs (01/08/2023)   PRAPARE - Administrator, Civil Service (Medical): No    Lack of Transportation (Non-Medical): No  Physical Activity: Sufficiently Active (01/08/2023)   Exercise Vital Sign    Days of Exercise per Week: 7 days  Minutes of Exercise per Session: 30 min  Stress: No Stress Concern Present (01/08/2023)   [employer] of Occupational Health - Occupational Stress Questionnaire    Feeling of Stress : Not at all  Social Connections: Moderately Integrated (01/08/2023)   Social Connection and Isolation Panel [NHANES]    Frequency of Communication with Friends and Family: More than three times a week    Frequency of Social Gatherings with Friends and Family: More than three times a week    Attends Religious Services: More than 4 times per year    Active Member of [employer] or Organizations: No    Attends Tax inspector Meetings: Never    Marital Status: Married  Catering manager Violence: Not At Risk (01/08/2023)   Humiliation, Afraid, Rape, and Kick questionnaire    Fear of Current or Ex-Partner: No    Emotionally Abused: No    Physically Abused: No    Sexually Abused: No    Past Surgical History:  Procedure Laterality Date   CATARACT EXTRACTION, BILATERAL  2021   LEFT HEART CATH AND CORONARY ANGIOGRAPHY N/A 11/29/2022   Procedure: LEFT HEART CATH AND CORONARY ANGIOGRAPHY;  Surgeon: Tonny Bollman, MD;  Location: [facility] INVASIVE CV LAB;  Service: Cardiovascular;  Laterality: N/A;   SALPINGOOPHORECTOMY     right; laproscopic    Family History  Problem Relation Age of Onset   Hypertension Mother    Heart attack Mother 85       sudden cardiac death   COPD Father    Heart attack Father 56       developed ischemic cardiomyopathy   Heart disease Sister     Allergies  Allergen Reactions   Augmentin [Amoxicillin-Pot Clavulanate] Diarrhea    Current Outpatient Medications on File Prior to Visit  Medication Sig Dispense Refill   acetaminophen (TYLENOL) 500 MG tablet Take 1,000 mg by mouth every 6 (six) hours as needed for headache.     amLODipine-valsartan (EXFORGE) 5-160 MG tablet Take 1 tablet by mouth once daily for blood pressure 90 tablet 0   aspirin EC 81 MG tablet Take 1 tablet (81 mg total) by mouth daily. Swallow whole. 90 tablet 3   atenolol (TENORMIN) 25 MG tablet Take 1 tablet by mouth once daily for blood pressure 90 tablet 1   cyanocobalamin (VITAMIN B12) 1000 MCG tablet Take 1,000 mcg by mouth daily.     DULoxetine (CYMBALTA) 30 MG capsule TAKE 1 CAPSULE BY MOUTH ONCE DAILY FOR ANXIETY AND FOR DEPRESSION 90 capsule 1   ibuprofen (ADVIL) 200 MG tablet Take 400 mg by mouth every 6 (six) hours as needed for mild pain, moderate pain or headache.     isosorbide mononitrate (IMDUR) 30 MG 24 hr tablet Take 1 tablet (30 mg total) by mouth daily. 90 tablet 3   Multiple  Vitamins-Minerals (PRESERVISION AREDS 2) CAPS Take 1 capsule by mouth daily.     nitroGLYCERIN (NITROSTAT) 0.4 MG SL tablet Dissolve 1 tablet under the tongue every 5 minutes for up to 3 doses for chest pain, then call 911 75 tablet 3   omeprazole (PRILOSEC) 20 MG capsule Take 1 capsule (20 mg total) by mouth 2 (two) times daily. (Patient taking differently: Take 20 mg by mouth daily.) 180 capsule 3   rosuvastatin (CRESTOR) 10 MG tablet Take 1 tablet (10 mg total) by mouth daily. 90 tablet 3   Current Facility-Administered Medications on File Prior to Visit  Medication Dose Route Frequency Provider Last Rate Last Admin  cyanocobalamin ((VITAMIN B-12)) injection 1,000 mcg  1,000 mcg Intramuscular Once Doreene Nest, NP        BP 132/78   Pulse 61   Temp (!) 97.3 F (36.3 C) (Temporal)   Ht 5\' 6"  (1.676 m)   Wt 151 lb (68.5 kg)   SpO2 98%   BMI 24.37 kg/m  Objective:   Physical Exam HENT:     Right Ear: Tympanic membrane and ear canal normal.     Left Ear: Tympanic membrane and ear canal normal.     Nose: Nose normal.  Eyes:     Conjunctiva/sclera: Conjunctivae normal.     Pupils: Pupils are equal, round, and reactive to light.  Neck:     Thyroid: No thyromegaly.  Cardiovascular:     Rate and Rhythm: Normal rate and regular rhythm.     Heart sounds: No murmur heard. Pulmonary:     Effort: Pulmonary effort is normal.     Breath sounds: Normal breath sounds. No rales.  Abdominal:     General: Bowel sounds are normal.     Palpations: Abdomen is soft.     Tenderness: There is no abdominal tenderness.  Musculoskeletal:        General: Normal range of motion.     Cervical back: Neck supple.  Lymphadenopathy:     Cervical: No cervical adenopathy.  Skin:    General: Skin is warm and dry.     Findings: No rash.  Neurological:     Mental Status: She is alert and oriented to person, place, and time.     Cranial Nerves: No cranial nerve deficit.     Deep Tendon Reflexes:  Reflexes are normal and symmetric.           Assessment & Plan:  Preventative health care Assessment & Plan: Immunizations UTD. Mammogram and Bone Density UTD Colonoscopy UTD, due 2025  Discussed the importance of a healthy diet and regular exercise in order for weight loss, and to reduce the risk of further co-morbidity.  Exam stable. Labs pending.  Follow up in 1 year for repeat physical.    Essential hypertension Assessment & Plan: Improved!  Continue amlodipine-valsartan 5-160 mg daily, atenolol 25 mg daily. CMP pending.    Orders: -     Comprehensive metabolic panel  Gastroesophageal reflux disease, unspecified whether esophagitis present Assessment & Plan: Controlled.  Continue omeprazole 20 mg daily.   Impacted cerumen of right ear Assessment & Plan: Right cerumen impaction identified on exam. Patient consented to irrigation of canals bilaterally.  Right canal irrigated. Patient tolerated well. TM's and canals post irrigation unremarkable.   Discussed home care instructions.     Stage 2 chronic kidney disease Assessment & Plan: Repeat renal function pending.   B12 deficiency Assessment & Plan: Repeat B12 level pending.  Continue vitamin B12 1000 mcg daily  Orders: -     Vitamin B12  Recurrent major depressive disorder, in full remission (HCC) Assessment & Plan: Overall controlled.  Continue duloxetine 30 mg daily.   Mixed hyperlipidemia Assessment & Plan: Repeat lipid panel pending. Continue rosuvastatin 10 mg daily  Orders: -     Lipid panel  Palpitations Assessment & Plan: Resolved since 12/11/22. Reviewed cardiology work up from March and April 2024.  Continue atenolol 25 mg daily and Imdur 30 mg daily. Consider Holter Monitor if symptoms return.    Macular degeneration of both eyes, unspecified type Assessment & Plan: Eye exam UTD.  Pleas Koch, NP

## 2023-01-23 NOTE — Assessment & Plan Note (Signed)
Right cerumen impaction identified on exam. Patient consented to irrigation of canals bilaterally.  Right canal irrigated. Patient tolerated well. TM's and canals post irrigation unremarkable.   Discussed home care instructions.   

## 2023-01-23 NOTE — Assessment & Plan Note (Signed)
Repeat B12 level pending.  Continue vitamin B12 1000 mcg daily

## 2023-01-23 NOTE — Assessment & Plan Note (Signed)
Overall controlled.  Continue duloxetine 30 mg daily.

## 2023-01-23 NOTE — Assessment & Plan Note (Signed)
Eye exam UTD 

## 2023-01-23 NOTE — Assessment & Plan Note (Signed)
Immunizations UTD. Mammogram and Bone Density UTD Colonoscopy UTD, due 2025  Discussed the importance of a healthy diet and regular exercise in order for weight loss, and to reduce the risk of further co-morbidity.  Exam stable. Labs pending.  Follow up in 1 year for repeat physical.

## 2023-01-23 NOTE — Assessment & Plan Note (Signed)
Repeat lipid panel pending. Continue rosuvastatin 10 mg daily. 

## 2023-01-23 NOTE — Patient Instructions (Signed)
Stop by the lab prior to leaving today. I will notify you of your results once received.   It was a pleasure to see you today!  

## 2023-01-23 NOTE — Assessment & Plan Note (Signed)
Resolved since 12/11/22. Reviewed cardiology work up from March and April 2024.  Continue atenolol 25 mg daily and Imdur 30 mg daily. Consider Holter Monitor if symptoms return.

## 2023-01-23 NOTE — Assessment & Plan Note (Signed)
Improved!  Continue amlodipine-valsartan 5-160 mg daily, atenolol 25 mg daily. CMP pending.

## 2023-01-23 NOTE — Assessment & Plan Note (Signed)
Controlled.   Continue omeprazole 20 mg daily. 

## 2023-01-23 NOTE — Assessment & Plan Note (Signed)
Repeat renal function pending. 

## 2023-02-07 ENCOUNTER — Other Ambulatory Visit: Payer: Self-pay | Admitting: Primary Care

## 2023-02-07 DIAGNOSIS — E785 Hyperlipidemia, unspecified: Secondary | ICD-10-CM

## 2023-02-07 DIAGNOSIS — F3342 Major depressive disorder, recurrent, in full remission: Secondary | ICD-10-CM

## 2023-03-20 ENCOUNTER — Other Ambulatory Visit: Payer: Self-pay | Admitting: Primary Care

## 2023-03-20 DIAGNOSIS — I10 Essential (primary) hypertension: Secondary | ICD-10-CM

## 2023-05-30 DIAGNOSIS — M25512 Pain in left shoulder: Secondary | ICD-10-CM | POA: Diagnosis not present

## 2023-06-15 ENCOUNTER — Encounter: Payer: Self-pay | Admitting: Primary Care

## 2023-06-15 ENCOUNTER — Ambulatory Visit: Payer: Medicare PPO | Admitting: Primary Care

## 2023-06-15 VITALS — BP 144/82 | HR 55 | Temp 97.2°F | Ht 66.0 in | Wt 153.0 lb

## 2023-06-15 DIAGNOSIS — R300 Dysuria: Secondary | ICD-10-CM | POA: Diagnosis not present

## 2023-06-15 DIAGNOSIS — Z23 Encounter for immunization: Secondary | ICD-10-CM

## 2023-06-15 LAB — POC URINALSYSI DIPSTICK (AUTOMATED)
Bilirubin, UA: POSITIVE
Blood, UA: POSITIVE
Glucose, UA: POSITIVE — AB
Ketones, UA: POSITIVE
Nitrite, UA: POSITIVE
Protein, UA: POSITIVE — AB
Spec Grav, UA: 1.01 (ref 1.010–1.025)
Urobilinogen, UA: 4 U/dL — AB
pH, UA: 5 (ref 5.0–8.0)

## 2023-06-15 MED ORDER — SULFAMETHOXAZOLE-TRIMETHOPRIM 800-160 MG PO TABS
1.0000 | ORAL_TABLET | Freq: Two times a day (BID) | ORAL | 0 refills | Status: DC
Start: 2023-06-15 — End: 2024-01-25

## 2023-06-15 NOTE — Assessment & Plan Note (Signed)
HPI suggestive of acute cystitis.  Urinalysis today with 3+ leuks, positive nitrites, positive blood and glucose Urine culture ordered and pending.  Given symptoms, coupled with urinalysis results, will treat for presumed UTI.  Start Bactrim DS (sulfamethoxazole/trimethoprim) tablets for urinary tract infection. Take 1 tablet by mouth twice daily for 3 days. Follow up PRN

## 2023-06-15 NOTE — Patient Instructions (Signed)
Start Bactrim DS (sulfamethoxazole/trimethoprim) tablets for urinary tract infection. Take 1 tablet by mouth twice daily for 3 days.  You may take Azo as needed.  Push intake of water.  It was a pleasure to see you today!

## 2023-06-15 NOTE — Addendum Note (Signed)
Addended by: Doreene Nest on: 06/15/2023 12:06 PM   Modules accepted: Level of Service

## 2023-06-15 NOTE — Progress Notes (Signed)
Subjective:    Patient ID: Heather [name], female    DOB: 05-05-1956, 67 y.o.   MRN: [id number]  Dysuria  Associated symptoms include frequency. Pertinent negatives include no hematuria.    Heather [name] is a very pleasant 67 y.o. female with a history of hypertension, CKD, vaginal atrophy, palpitations, bowel incontinence who presents today to discuss dysuria.  Symptom onset a few weeks ago with urinary urge with intermittent incontinence. Two days ago she began to experience dysuria. Last night she experienced bilateral lower back pain.   She completed a UTI test OTC which was positive for nitrites. She has been taking AZO and has taken two doses total. She tested her urine before taking her first AZO pill.  She denies hematuria, vaginal discharge, vaginal itching.  Her current symptoms feel like her prior UTIs.   Review of Systems  Constitutional:  Negative for fever.  Gastrointestinal:  Negative for abdominal pain.  Genitourinary:  Positive for dysuria and frequency. Negative for hematuria and vaginal discharge.  Musculoskeletal:  Positive for back pain.         Past Medical History:  Diagnosis Date   B12 deficiency    Chronic kidney disease    COVID-19 virus infection 06/20/2021   Depression    Dog bite 05/03/2021   GERD (gastroesophageal reflux disease)    Hyperlipidemia    Hypertension    had ankle swelling with amlodipine   Infection of urinary tract 12/16/2014   Liver mass 12/28/2015   Near syncope    Palpitations    RLS (restless legs syndrome)    Welcome to Medicare preventive visit 10/17/2019    Social History   Socioeconomic History   Marital status: Married    Spouse name: Not on file   Number of children: 1   Years of education: Not on file   Highest education level: Not on file  Occupational History   Occupation: retired Ambulance person: Retired   Occupation: HR (part-time)    Employer: [employer]  Tobacco Use   Smoking  status: Never   Smokeless tobacco: Never  Vaping Use   Vaping status: Never Used  Substance and Sexual Activity   Alcohol use: No   Drug use: No   Sexual activity: Not on file  Other Topics Concern   Not on file  Social History Narrative   Works part-time in [employer] for [employer] in [employer] in [place] Married   Social Determinants of Health   Financial Resource Strain: Low Risk  (01/08/2023)   Overall Financial Resource Strain (CARDIA)    Difficulty of Paying Living Expenses: Not hard at all  Food Insecurity: No Food Insecurity (01/08/2023)   Hunger Vital Sign    Worried About Running Out of Food in the Last Year: Never true    Ran Out of Food in the Last Year: Never true  Transportation Needs: No Transportation Needs (01/08/2023)   PRAPARE - Administrator, Civil Service (Medical): No    Lack of Transportation (Non-Medical): No  Physical Activity: Sufficiently Active (01/08/2023)   Exercise Vital Sign    Days of Exercise per Week: 7 days    Minutes of Exercise per Session: 30 min  Stress: No Stress Concern Present (01/08/2023)   [employer] of Occupational Health - Occupational Stress Questionnaire    Feeling of Stress : Not at all  Social Connections: Moderately Integrated (01/08/2023)   Social Connection and Isolation Panel [NHANES]    Frequency  of Communication with Friends and Family: More than three times a week    Frequency of Social Gatherings with Friends and Family: More than three times a week    Attends Religious Services: More than 4 times per year    Active Member of [employer] or Organizations: No    Attends Banker Meetings: Never    Marital Status: Married  Catering manager Violence: Not At Risk (01/08/2023)   Humiliation, Afraid, Rape, and Kick questionnaire    Fear of Current or Ex-Partner: No    Emotionally Abused: No    Physically Abused: No    Sexually Abused: No    Past Surgical History:  Procedure Laterality Date    CATARACT EXTRACTION, BILATERAL  2021   LEFT HEART CATH AND CORONARY ANGIOGRAPHY N/A 11/29/2022   Procedure: LEFT HEART CATH AND CORONARY ANGIOGRAPHY;  Surgeon: Tonny Bollman, MD;  Location: [facility] INVASIVE CV LAB;  Service: Cardiovascular;  Laterality: N/A;   SALPINGOOPHORECTOMY     right; laproscopic    Family History  Problem Relation Age of Onset   Hypertension Mother    Heart attack Mother 14       sudden cardiac death   COPD Father    Heart attack Father 3       developed ischemic cardiomyopathy   Heart disease Sister     Allergies  Allergen Reactions   Augmentin [Amoxicillin-Pot Clavulanate] Diarrhea    Current Outpatient Medications on File Prior to Visit  Medication Sig Dispense Refill   acetaminophen (TYLENOL) 500 MG tablet Take 1,000 mg by mouth every 6 (six) hours as needed for headache.     amLODipine-valsartan (EXFORGE) 5-160 MG tablet Take 1 tablet by mouth once daily for blood pressure 90 tablet 2   aspirin EC 81 MG tablet Take 1 tablet (81 mg total) by mouth daily. Swallow whole. 90 tablet 3   atenolol (TENORMIN) 25 MG tablet Take 1 tablet by mouth once daily for blood pressure 90 tablet 3   DULoxetine (CYMBALTA) 30 MG capsule TAKE 1 CAPSULE BY MOUTH ONCE DAILY FOR ANXIETY AND DEPRESSION 90 capsule 2   ibuprofen (ADVIL) 200 MG tablet Take 400 mg by mouth every 6 (six) hours as needed for mild pain, moderate pain or headache.     isosorbide mononitrate (IMDUR) 30 MG 24 hr tablet Take 1 tablet (30 mg total) by mouth daily. 90 tablet 3   Multiple Vitamins-Minerals (PRESERVISION AREDS 2) CAPS Take 1 capsule by mouth daily.     nitroGLYCERIN (NITROSTAT) 0.4 MG SL tablet Dissolve 1 tablet under the tongue every 5 minutes for up to 3 doses for chest pain, then call 911 75 tablet 3   omeprazole (PRILOSEC) 20 MG capsule Take 1 capsule (20 mg total) by mouth 2 (two) times daily. (Patient taking differently: Take 20 mg by mouth daily.) 180 capsule 3   rosuvastatin (CRESTOR) 10  MG tablet Take 1 tablet (10 mg total) by mouth daily. 90 tablet 3   cyanocobalamin (VITAMIN B12) 1000 MCG tablet Take 1,000 mcg by mouth daily. (Patient not taking: Reported on 06/15/2023)     Current Facility-Administered Medications on File Prior to Visit  Medication Dose Route Frequency Provider Last Rate Last Admin   cyanocobalamin ((VITAMIN B-12)) injection 1,000 mcg  1,000 mcg Intramuscular Once Doreene Nest, NP        BP (!) 144/82   Pulse (!) 55   Temp (!) 97.2 F (36.2 C) (Temporal)   Ht 5\' 6"  (1.676 m)  Wt 153 lb (69.4 kg)   SpO2 92%   BMI 24.69 kg/m  Objective:   Physical Exam Cardiovascular:     Rate and Rhythm: Normal rate and regular rhythm.  Pulmonary:     Effort: Pulmonary effort is normal.     Breath sounds: Normal breath sounds.  Abdominal:     Tenderness: There is no right CVA tenderness or left CVA tenderness.  Musculoskeletal:     Cervical back: Neck supple.  Skin:    General: Skin is warm and dry.  Neurological:     Mental Status: She is alert and oriented to person, place, and time.  Psychiatric:        Mood and Affect: Mood normal.           Assessment & Plan:  Dysuria Assessment & Plan: HPI suggestive of acute cystitis.  Urinalysis today with 3+ leuks, positive nitrites, positive blood and glucose Urine culture ordered and pending.  Given symptoms, coupled with urinalysis results, will treat for presumed UTI.  Start Bactrim DS (sulfamethoxazole/trimethoprim) tablets for urinary tract infection. Take 1 tablet by mouth twice daily for 3 days. Follow up PRN  Orders: -     POCT Urinalysis Dipstick (Automated) -     Urine Culture -     Sulfamethoxazole-Trimethoprim; Take 1 tablet by mouth 2 (two) times daily. For urinary tract infection.  Dispense: 6 tablet; Refill: 0  Encounter for immunization -     Flu Vaccine Trivalent High Dose (Fluad)        Doreene Nest, NP

## 2023-06-17 LAB — URINE CULTURE
MICRO NUMBER:: 15525160
SPECIMEN QUALITY:: ADEQUATE

## 2023-06-26 ENCOUNTER — Other Ambulatory Visit: Payer: Self-pay | Admitting: Primary Care

## 2023-06-26 DIAGNOSIS — Z1231 Encounter for screening mammogram for malignant neoplasm of breast: Secondary | ICD-10-CM

## 2023-07-24 ENCOUNTER — Ambulatory Visit
Admission: RE | Admit: 2023-07-24 | Discharge: 2023-07-24 | Disposition: A | Discharge status: Home / Self Care | Payer: Medicare PPO | Source: Ambulatory Visit | Attending: Primary Care | Admitting: Primary Care

## 2023-07-24 DIAGNOSIS — Z1231 Encounter for screening mammogram for malignant neoplasm of breast: Secondary | ICD-10-CM | POA: Diagnosis not present

## 2023-08-08 DIAGNOSIS — L821 Other seborrheic keratosis: Secondary | ICD-10-CM | POA: Diagnosis not present

## 2023-08-08 DIAGNOSIS — D225 Melanocytic nevi of trunk: Secondary | ICD-10-CM | POA: Diagnosis not present

## 2023-08-08 DIAGNOSIS — L570 Actinic keratosis: Secondary | ICD-10-CM | POA: Diagnosis not present

## 2023-08-29 DIAGNOSIS — M19012 Primary osteoarthritis, left shoulder: Secondary | ICD-10-CM | POA: Diagnosis not present

## 2023-09-28 DIAGNOSIS — H43823 Vitreomacular adhesion, bilateral: Secondary | ICD-10-CM | POA: Diagnosis not present

## 2023-09-28 DIAGNOSIS — H35033 Hypertensive retinopathy, bilateral: Secondary | ICD-10-CM | POA: Diagnosis not present

## 2023-09-28 DIAGNOSIS — Q141 Congenital malformation of retina: Secondary | ICD-10-CM | POA: Diagnosis not present

## 2023-09-28 DIAGNOSIS — H353133 Nonexudative age-related macular degeneration, bilateral, advanced atrophic without subfoveal involvement: Secondary | ICD-10-CM | POA: Diagnosis not present

## 2023-09-28 DIAGNOSIS — Z961 Presence of intraocular lens: Secondary | ICD-10-CM | POA: Diagnosis not present

## 2023-10-24 ENCOUNTER — Ambulatory Visit: Payer: Self-pay | Admitting: Primary Care

## 2023-10-24 ENCOUNTER — Telehealth: Payer: Medicare PPO | Admitting: Nurse Practitioner

## 2023-10-24 ENCOUNTER — Encounter: Payer: Self-pay | Admitting: Nurse Practitioner

## 2023-10-24 DIAGNOSIS — U071 COVID-19: Secondary | ICD-10-CM | POA: Insufficient documentation

## 2023-10-24 DIAGNOSIS — J069 Acute upper respiratory infection, unspecified: Secondary | ICD-10-CM | POA: Insufficient documentation

## 2023-10-24 MED ORDER — BENZONATATE 100 MG PO CAPS
100.0000 mg | ORAL_CAPSULE | Freq: Three times a day (TID) | ORAL | 0 refills | Status: DC | PRN
Start: 2023-10-24 — End: 2024-01-25

## 2023-10-24 MED ORDER — FLUTICASONE PROPIONATE 50 MCG/ACT NA SUSP
2.0000 | Freq: Every day | NASAL | 6 refills | Status: DC
Start: 2023-10-24 — End: 2024-01-25

## 2023-10-24 NOTE — Telephone Encounter (Signed)
 Copied from CRM [id number]. Topic: Clinical - Medication Question >> Oct 24, 2023  9:31 AM Erling Hawthorne B wrote: Reason for CRM: Pt stated that she has Covid and would like to know can she get some medication.

## 2023-10-24 NOTE — Progress Notes (Addendum)
 Virtual Visit via video Note Due to COVID-19 pandemic this visit was conducted virtually. This visit type was conducted due to national recommendations for restrictions regarding the COVID-19 Pandemic (e.g. social distancing, sheltering in place) in an effort to limit this patient's exposure and mitigate transmission in our community. All issues noted in this document were discussed and addressed.  A physical exam was not performed with this format.   I connected with Heather [name] on 10/24/2023 at 1150 by name and DOB and verified that I am speaking with the correct person using two identifiers. Heather [name] is currently located at home  during visit. The provider, Nena Deitra Morton Sebastian, DNP is located in their office at time of visit.  I discussed the limitations, risks, security and privacy concerns of performing an evaluation and management service by virtual visit and the availability of in person appointments. I also discussed with the patient that there may be a patient responsible charge related to this service. The patient expressed understanding and agreed to proceed.  Subjective:  Patient ID: Heather [name], female    DOB: 1955/11/10, 68 y.o.   MRN: [id number]  Chief Complaint:  Covid Positive (Took a COVID test morning and it was positive), Fever, and URI   HPI: Heather [name] is a 68 y.o. female presenting on 10/24/2023 via telehealth for Covid Positive (Took a COVID test morning and it was positive), Fever, and URI  The patient is a 68 year old female who presents via telehealth today with acute concerns for a possible COVID-19 infection. She reports that she took a COVID test this morning, which came back positive. [name] symptoms began yesterday and include a fever of 99.53F, nasal congestion, and myalgia. She has been taking Mucinex Cold, which has provided some short-term relief from [name] symptoms. However, [name] symptoms persist. She denies experiencing any chest pain,  shortness of breath (SOB), nausea, vomiting, or diarrhea.  Relevant past medical, surgical, family, and social history reviewed and updated as indicated.  Allergies and medications reviewed and updated.   Past Medical History:  Diagnosis Date   B12 deficiency    Chronic kidney disease    COVID-19 virus infection 06/20/2021   Depression    Dog bite 05/03/2021   GERD (gastroesophageal reflux disease)    Hyperlipidemia    Hypertension    had ankle swelling with amlodipine    Infection of urinary tract 12/16/2014   Liver mass 12/28/2015   Near syncope    Palpitations    RLS (restless legs syndrome)    Welcome to Medicare preventive visit 10/17/2019    Past Surgical History:  Procedure Laterality Date   BREAST EXCISIONAL BIOPSY Left 1976   benign   CATARACT EXTRACTION, BILATERAL  2021   LEFT HEART CATH AND CORONARY ANGIOGRAPHY N/A 11/29/2022   Procedure: LEFT HEART CATH AND CORONARY ANGIOGRAPHY;  Surgeon: Wonda Sharper, MD;  Location: [facility] INVASIVE CV LAB;  Service: Cardiovascular;  Laterality: N/A;   SALPINGOOPHORECTOMY     right; laproscopic    Social History   Socioeconomic History   Marital status: Married    Spouse name: Not on file   Number of children: 1   Years of education: Not on file   Highest education level: Not on file  Occupational History   Occupation: retired Ambulance Person: Retired   Occupation: HR (part-time)    Employer: [employer]  Tobacco Use   Smoking status: Never   Smokeless tobacco: Never  Advertising Account Planner  Vaping status: Never Used  Substance and Sexual Activity   Alcohol use: No   Drug use: No   Sexual activity: Not on file  Other Topics Concern   Not on file  Social History Narrative   Works part-time in [employer] for [employer] in [employer] in [place] Married   Social Drivers of Health   Financial Resource Strain: Low Risk  (01/08/2023)   Overall Financial Resource Strain (CARDIA)    Difficulty of Paying Living  Expenses: Not hard at all  Food Insecurity: No Food Insecurity (01/08/2023)   Hunger Vital Sign    Worried About Running Out of Food in the Last Year: Never true    Ran Out of Food in the Last Year: Never true  Transportation Needs: No Transportation Needs (01/08/2023)   PRAPARE - Administrator, Civil Service (Medical): No    Lack of Transportation (Non-Medical): No  Physical Activity: Sufficiently Active (01/08/2023)   Exercise Vital Sign    Days of Exercise per Week: 7 days    Minutes of Exercise per Session: 30 min  Stress: No Stress Concern Present (01/08/2023)   [employer] of Occupational Health - Occupational Stress Questionnaire    Feeling of Stress : Not at all  Social Connections: Moderately Integrated (01/08/2023)   Social Connection and Isolation Panel [NHANES]    Frequency of Communication with Friends and Family: More than three times a week    Frequency of Social Gatherings with Friends and Family: More than three times a week    Attends Religious Services: More than 4 times per year    Active Member of [employer] or Organizations: No    Attends Banker Meetings: Never    Marital Status: Married  Catering Manager Violence: Not At Risk (01/08/2023)   Humiliation, Afraid, Rape, and Kick questionnaire    Fear of Current or Ex-Partner: No    Emotionally Abused: No    Physically Abused: No    Sexually Abused: No    Outpatient Encounter Medications as of 10/24/2023  Medication Sig   benzonatate  (TESSALON  PERLES) 100 MG capsule Take 1 capsule (100 mg total) by mouth 3 (three) times daily as needed.   fluticasone  (FLONASE ) 50 MCG/ACT nasal spray Place 2 sprays into both nostrils daily.   acetaminophen  (TYLENOL ) 500 MG tablet Take 1,000 mg by mouth every 6 (six) hours as needed for headache.   amLODipine -valsartan  (EXFORGE ) 5-160 MG tablet Take 1 tablet by mouth once daily for blood pressure   aspirin  EC 81 MG tablet Take 1 tablet (81 mg total) by mouth  daily. Swallow whole.   atenolol  (TENORMIN ) 25 MG tablet Take 1 tablet by mouth once daily for blood pressure   cyanocobalamin  (VITAMIN B12) 1000 MCG tablet Take 1,000 mcg by mouth daily. (Patient not taking: Reported on 06/15/2023)   DULoxetine  (CYMBALTA ) 30 MG capsule TAKE 1 CAPSULE BY MOUTH ONCE DAILY FOR ANXIETY AND DEPRESSION   ibuprofen (ADVIL) 200 MG tablet Take 400 mg by mouth every 6 (six) hours as needed for mild pain, moderate pain or headache.   isosorbide  mononitrate (IMDUR ) 30 MG 24 hr tablet Take 1 tablet (30 mg total) by mouth daily.   Multiple Vitamins-Minerals (PRESERVISION AREDS 2) CAPS Take 1 capsule by mouth daily.   nitroGLYCERIN  (NITROSTAT ) 0.4 MG SL tablet Dissolve 1 tablet under the tongue every 5 minutes for up to 3 doses for chest pain, then call 911   omeprazole  (PRILOSEC) 20 MG capsule Take 1 capsule (20 mg  total) by mouth 2 (two) times daily. (Patient taking differently: Take 20 mg by mouth daily.)   rosuvastatin  (CRESTOR ) 10 MG tablet Take 1 tablet (10 mg total) by mouth daily.   sulfamethoxazole -trimethoprim  (BACTRIM  DS) 800-160 MG tablet Take 1 tablet by mouth 2 (two) times daily. For urinary tract infection.   Facility-Administered Encounter Medications as of 10/24/2023  Medication   cyanocobalamin  ((VITAMIN B-12)) injection 1,000 mcg    Allergies  Allergen Reactions   Augmentin  [Amoxicillin -Pot Clavulanate] Diarrhea    Review of Systems  Constitutional:  Negative for activity change, appetite change and fatigue.  HENT:  Positive for congestion. Negative for postnasal drip, sinus pain and sore throat.   Eyes:  Negative for pain.  Respiratory:  Positive for cough. Negative for shortness of breath and wheezing.        Dry  Cardiovascular:  Negative for chest pain and palpitations.  Gastrointestinal:  Negative for diarrhea, nausea and vomiting.  Musculoskeletal:  Negative for myalgias.  Skin:  Negative for color change and rash.  Neurological:  Negative for  dizziness and headaches.     COVID-19 Immunizations Administered as of 10/24/2023     Name Date   Pfizer COVID-19 Vaccine 07/31/2020, 10/28/2019, 10/07/2019       Observations/Objective: No vital signs or physical exam, this was a virtual health encounter.  Pt alert and oriented, answers all questions appropriately, and able to speak in full sentences.    Assessment and Plan: Heather [name] was seen today for covid positive, fever and uri.  Diagnoses and all orders for this visit:  Positive self-administered antigen test for COVID-19 -     fluticasone  (FLONASE ) 50 MCG/ACT nasal spray; Place 2 sprays into both nostrils daily. -     benzonatate  (TESSALON  PERLES) 100 MG capsule; Take 1 capsule (100 mg total) by mouth 3 (three) times daily as needed.  URI with cough and congestion -     fluticasone  (FLONASE ) 50 MCG/ACT nasal spray; Place 2 sprays into both nostrils daily. -     benzonatate  (TESSALON  PERLES) 100 MG capsule; Take 1 capsule (100 mg total) by mouth 3 (three) times daily as needed.  Heather [name] is a 68 year old Caucasian female seen today via telehealth for positive COVID-19 test, no acute distress COVID positive: last EGFR from 5//03/2023 was 48.38 since experienicng mild symptoms, she declines Paxlovid, will treat [name] cough with Tessalon  pearl 100 mg TID PRN and Flonase  for [name] congestion; Increase hydration and rest, tylenol  for fever All questions answered,  FOLLOW UP: if no improvement follow up with PCP       Person Under Monitoring Name: Heather [name]  Location: [address] [place] [facility] [address]   Infection Prevention Recommendations for Individuals Confirmed to have, or Being Evaluated for, 2019 Novel Coronavirus (COVID-19) Infection Who Receive Care at Home  Individuals who are confirmed to have, or are being evaluated for, COVID-19 should follow the prevention steps below until a healthcare provider or local or state health department says they can return  to normal activities.  Stay home except to get medical care You should restrict activities outside your home, except for getting medical care. Do not go to work, school, or public areas, and do not use public transportation or taxis.  Call ahead before visiting your doctor Before your medical appointment, call the healthcare provider and tell them that you have, or are being evaluated for, COVID-19 infection. This will help the healthcare provider's office take steps to keep other people from getting  infected. Ask your healthcare provider to call the local or state health department.  Monitor your symptoms Seek prompt medical attention if your illness is worsening (e.g., difficulty breathing). Before going to your medical appointment, call the healthcare provider and tell them that you have, or are being evaluated for, COVID-19 infection. Ask your healthcare provider to call the local or state health department.  Wear a facemask You should wear a facemask that covers your nose and mouth when you are in the same room with other people and when you visit a healthcare provider. People who live with or visit you should also wear a facemask while they are in the same room with you.  Separate yourself from other people in your home As much as possible, you should stay in a different room from other people in your home. Also, you should use a separate bathroom, if available.  Avoid sharing household items You should not share dishes, drinking glasses, cups, eating utensils, towels, bedding, or other items with other people in your home. After using these items, you should wash them thoroughly with soap and water.  Cover your coughs and sneezes Cover your mouth and nose with a tissue when you cough or sneeze, or you can cough or sneeze into your sleeve. Throw used tissues in a lined trash can, and immediately wash your hands with soap and water for at least 20 seconds or use an alcohol-based  hand rub.  Wash your [employer] your hands often and thoroughly with soap and water for at least 20 seconds. You can use an alcohol-based hand sanitizer if soap and water are not available and if your hands are not visibly dirty. Avoid touching your eyes, nose, and mouth with unwashed hands.   Prevention Steps for Caregivers and Household Members of Individuals Confirmed to have, or Being Evaluated for, COVID-19 Infection Being Cared for in the Home  If you live with, or provide care at home for, a person confirmed to have, or being evaluated for, COVID-19 infection please follow these guidelines to prevent infection:  Follow healthcare provider's instructions Make sure that you understand and can help the patient follow any healthcare provider instructions for all care.  Provide for the patient's basic needs You should help the patient with basic needs in the home and provide support for getting groceries, prescriptions, and other personal needs.  Monitor the patient's symptoms If they are getting sicker, call his or [name] medical provider and tell them that the patient has, or is being evaluated for, COVID-19 infection. This will help the healthcare provider's office take steps to keep other people from getting infected. Ask the healthcare provider to call the local or state health department.  Limit the number of people who have contact with the patient If possible, have only one caregiver for the patient. Other household members should stay in another home or place of residence. If this is not possible, they should stay in another room, or be separated from the patient as much as possible. Use a separate bathroom, if available. Restrict visitors who do not have an essential need to be in the home.  Keep older adults, very young children, and other sick people away from the patient Keep older adults, very young children, and those who have compromised immune systems or chronic  health conditions away from the patient. This includes people with chronic heart, lung, or kidney conditions, diabetes, and cancer.  Ensure good ventilation Make sure that shared spaces in the home  have good air flow, such as from an air conditioner or an opened window, weather permitting.  Wash your hands often Wash your hands often and thoroughly with soap and water for at least 20 seconds. You can use an alcohol based hand sanitizer if soap and water are not available and if your hands are not visibly dirty. Avoid touching your eyes, nose, and mouth with unwashed hands. Use disposable paper towels to dry your hands. If not available, use dedicated cloth towels and replace them when they become wet.  Wear a facemask and gloves Wear a disposable facemask at all times in the room and gloves when you touch or have contact with the patient's blood, body fluids, and/or secretions or excretions, such as sweat, saliva, sputum, nasal mucus, vomit, urine, or feces.  Ensure the mask fits over your nose and mouth tightly, and do not touch it during use. Throw out disposable facemasks and gloves after using them. Do not reuse. Wash your hands immediately after removing your facemask and gloves. If your personal clothing becomes contaminated, carefully remove clothing and launder. Wash your hands after handling contaminated clothing. Place all used disposable facemasks, gloves, and other waste in a lined container before disposing them with other household waste. Remove gloves and wash your hands immediately after handling these items.  Do not share dishes, glasses, or other household items with the patient Avoid sharing household items. You should not share dishes, drinking glasses, cups, eating utensils, towels, bedding, or other items with a patient who is confirmed to have, or being evaluated for, COVID-19 infection. After the person uses these items, you should wash them thoroughly with soap and  water.  Wash laundry thoroughly Immediately remove and wash clothes or bedding that have blood, body fluids, and/or secretions or excretions, such as sweat, saliva, sputum, nasal mucus, vomit, urine, or feces, on them. Wear gloves when handling laundry from the patient. Read and follow directions on labels of laundry or clothing items and detergent. In general, wash and dry with the warmest temperatures recommended on the label.  Clean all areas the individual has used often Clean all touchable surfaces, such as counters, tabletops, doorknobs, bathroom fixtures, toilets, phones, keyboards, tablets, and bedside tables, every day. Also, clean any surfaces that may have blood, body fluids, and/or secretions or excretions on them. Wear gloves when cleaning surfaces the patient has come in contact with. Use a diluted bleach solution (e.g., dilute bleach with 1 part bleach and 10 parts water) or a household disinfectant with a label that says EPA-registered for coronaviruses. To make a bleach solution at home, add 1 tablespoon of bleach to 1 quart (4 cups) of water. For a larger supply, add  cup of bleach to 1 gallon (16 cups) of water. Read labels of cleaning products and follow recommendations provided on product labels. Labels contain instructions for safe and effective use of the cleaning product including precautions you should take when applying the product, such as wearing gloves or eye protection and making sure you have good ventilation during use of the product. Remove gloves and wash hands immediately after cleaning.  Monitor yourself for signs and symptoms of illness Caregivers and household members are considered close contacts, should monitor their health, and will be asked to limit movement outside of the home to the extent possible. Follow the monitoring steps for close contacts listed on the symptom monitoring form.   ? If you have additional questions, contact your local health  department or call the  epidemiologist on call at [phone] (available 24/7). ? This guidance is subject to change. For the most up-to-date guidance from [facility], please refer to their website: tripmetro.hu  Follow Up Instructions:     I discussed the assessment and treatment plan with the patient. The patient was provided an opportunity to ask questions and all were answered. The patient agreed with the plan and demonstrated an understanding of the instructions.   The patient was advised to call back or seek an in-person evaluation if the symptoms worsen or if the condition fails to improve as anticipated.  The above assessment and management plan was discussed with the patient. The patient verbalized understanding of and has agreed to the management plan. Patient is aware to call the clinic if they develop any new symptoms or if symptoms persist or worsen. Patient is aware when to return to the clinic for a follow-up visit. Patient educated on when it is appropriate to go to the emergency department.    I provided 12 minutes of time during this video encounter.   Benzion Mesta [place] Thompson, DNP [facility] [address] [place], [place] [address] [phone] 10/24/2023

## 2023-10-24 NOTE — Telephone Encounter (Signed)
 Noted.

## 2023-10-24 NOTE — Telephone Encounter (Signed)
  Chief Complaint: COVID positive Symptoms: Sneezing, head congestion, cough- non-productive, fever, Frequency: Monday and worse today Pertinent Negatives: Patient denies n/v Disposition:  ED /[] Urgent Care (no appt availability in office) / [] Appointment(In office/virtual)/ [x]  [place] [facility]/ [] Home Care/ [] Refused Recommended Disposition /[] [facility]/ []  Follow-up with PCP Additional Notes: patient states feel bad all over. Pt would like to have medication to help with COVID Reason for Disposition  Fever present > 3 days (72 hours)  Answer Assessment - Initial Assessment Questions 1. COVID-19 DIAGNOSIS: How do you know that you have COVID? (e.g., positive lab test or self-test, diagnosed by doctor or NP/PA, symptoms after exposure).     COVID at home test was positive 2. COVID-19 EXPOSURE: Was there any known exposure to COVID before the symptoms began? CDC Definition of close contact: within 6 feet (2 meters) for a total of 15 minutes or more over a 24-hour period.      Pt and husband both tested positive today 3. ONSET: When did the COVID-19 symptoms start?      Monday 4. WORST SYMPTOM: What is your worst symptom? (e.g., cough, fever, shortness of breath, muscle aches)     Fever makes me feel the worse 5. COUGH: Do you have a cough? If Yes, ask: How bad is the cough?       Non productive 6. FEVER: Do you have a fever? If Yes, ask: What is your temperature, how was it measured, and when did it start?     yes 7. RESPIRATORY STATUS: Describe your breathing? (e.g., normal; shortness of breath, wheezing, unable to speak)      no 8. BETTER-SAME-WORSE: Are you getting better, staying the same or getting worse compared to yesterday?  If getting worse, ask, In what way?     worse 9. OTHER SYMPTOMS: Do you have any other symptoms?  (e.g., chills, fatigue, headache, loss of smell or taste, muscle pain, sore throat)     Sneezing, head congestion,  cough- non-productive, fever 10. HIGH RISK DISEASE: Do you have any chronic medical problems? (e.g., asthma, heart or lung disease, weak immune system, obesity, etc.)       no 11. VACCINE: Have you had the COVID-19 vaccine? If Yes, ask: Which one, how many shots, when did you get it?       yes 12. PREGNANCY: Is there any chance you are pregnant? When was your last menstrual period?       N/a 13. O2 SATURATION MONITOR:  Do you use an oxygen saturation monitor (pulse oximeter) at home? If Yes, ask What is your reading (oxygen level) today? What is your usual oxygen saturation reading? (e.g., 95%)       no  Protocols used: Coronavirus (COVID-19) Diagnosed or Suspected-A-AH

## 2023-11-13 ENCOUNTER — Other Ambulatory Visit: Payer: Self-pay | Admitting: Cardiovascular Disease

## 2023-11-13 DIAGNOSIS — Z01810 Encounter for preprocedural cardiovascular examination: Secondary | ICD-10-CM

## 2023-11-13 DIAGNOSIS — I10 Essential (primary) hypertension: Secondary | ICD-10-CM

## 2023-11-13 DIAGNOSIS — E782 Mixed hyperlipidemia: Secondary | ICD-10-CM

## 2023-11-13 DIAGNOSIS — H353123 Nonexudative age-related macular degeneration, left eye, advanced atrophic without subfoveal involvement: Secondary | ICD-10-CM | POA: Diagnosis not present

## 2023-11-13 DIAGNOSIS — I200 Unstable angina: Secondary | ICD-10-CM

## 2023-12-28 DIAGNOSIS — Z961 Presence of intraocular lens: Secondary | ICD-10-CM | POA: Diagnosis not present

## 2023-12-28 DIAGNOSIS — H43823 Vitreomacular adhesion, bilateral: Secondary | ICD-10-CM | POA: Diagnosis not present

## 2023-12-28 DIAGNOSIS — H353133 Nonexudative age-related macular degeneration, bilateral, advanced atrophic without subfoveal involvement: Secondary | ICD-10-CM | POA: Diagnosis not present

## 2023-12-28 DIAGNOSIS — H353113 Nonexudative age-related macular degeneration, right eye, advanced atrophic without subfoveal involvement: Secondary | ICD-10-CM | POA: Diagnosis not present

## 2023-12-28 DIAGNOSIS — Q141 Congenital malformation of retina: Secondary | ICD-10-CM | POA: Diagnosis not present

## 2023-12-28 DIAGNOSIS — H35033 Hypertensive retinopathy, bilateral: Secondary | ICD-10-CM | POA: Diagnosis not present

## 2024-01-05 ENCOUNTER — Other Ambulatory Visit: Payer: Self-pay | Admitting: Cardiovascular Disease

## 2024-01-05 ENCOUNTER — Other Ambulatory Visit: Payer: Self-pay | Admitting: Primary Care

## 2024-01-05 DIAGNOSIS — I10 Essential (primary) hypertension: Secondary | ICD-10-CM

## 2024-01-05 DIAGNOSIS — Z01810 Encounter for preprocedural cardiovascular examination: Secondary | ICD-10-CM

## 2024-01-05 DIAGNOSIS — E782 Mixed hyperlipidemia: Secondary | ICD-10-CM

## 2024-01-05 DIAGNOSIS — I200 Unstable angina: Secondary | ICD-10-CM

## 2024-01-06 NOTE — Telephone Encounter (Signed)
 Patient is due for CPE/follow up in mid May, this will be required prior to any further refills.  Please schedule, thank you!

## 2024-01-07 NOTE — Telephone Encounter (Signed)
 Patient scheduled.

## 2024-01-07 NOTE — Telephone Encounter (Signed)
 LVM for patient to c/b and schedule.

## 2024-01-09 ENCOUNTER — Ambulatory Visit (INDEPENDENT_AMBULATORY_CARE_PROVIDER_SITE_OTHER): Payer: Medicare PPO

## 2024-01-09 VITALS — Ht 66.0 in | Wt 153.0 lb

## 2024-01-09 DIAGNOSIS — Z0000 Encounter for general adult medical examination without abnormal findings: Secondary | ICD-10-CM

## 2024-01-09 DIAGNOSIS — Z1231 Encounter for screening mammogram for malignant neoplasm of breast: Secondary | ICD-10-CM

## 2024-01-09 NOTE — Patient Instructions (Signed)
 Heather [name] , Thank you for taking time to come for your Medicare Wellness Visit. I appreciate your ongoing commitment to your health goals. Please review the following plan we discussed and let me know if I can assist you in the future.   Referrals/Orders/Follow-Ups/Clinician Recommendations:   You have an order for:  []   2D Mammogram  [x]   3D Mammogram  []   Bone Density     Please call for appointment (provider pt request):  [facility] [address] [place], [place] [address] [phone]   Make sure to wear two-piece clothing.  No lotions, powders, or deodorants the day of the appointment. Make sure to bring picture ID and insurance card.  Bring list of medications you are currently taking including any supplements.    This is a list of the screening recommended for you and due dates:  Health Maintenance  Topic Date Due   COVID-19 Vaccine (6 - 2024-25 season) 05/20/2023   Flu Shot  04/18/2024   Colon Cancer Screening  08/09/2024   Medicare Annual Wellness Visit  01/08/2025   Mammogram  07/23/2025   DTaP/Tdap/Td vaccine (2 - Td or Tdap) 10/11/2028   Pneumonia Vaccine  Completed   DEXA scan (bone density measurement)  Completed   Hepatitis C Screening  Completed   Zoster (Shingles) Vaccine  Completed   HPV Vaccine  Aged Out   Meningitis B Vaccine  Aged Out    Advanced directives: (Declined) Advance directive discussed with you today. Even though you declined this today, please call our office should you change your mind, and we can give you the proper paperwork for you to fill out.  Next Medicare Annual Wellness Visit scheduled for next year: Yes 01/08/25 @ 11:30am televisit

## 2024-01-09 NOTE — Progress Notes (Signed)
 Subjective:   Heather [name] is a 68 y.o. who presents for a Medicare Wellness preventive visit.  Visit Complete: Virtual I connected with  Heather [name] on 01/09/24 by a audio enabled telemedicine application and verified that I am speaking with the correct person using two identifiers.  Patient Location: Home  Provider Location: Home Office  I discussed the limitations of evaluation and management by telemedicine. The patient expressed understanding and agreed to proceed.  Vital Signs: Because this visit was a virtual/telehealth visit, some criteria may be missing or patient reported. Any vitals not documented were not able to be obtained and vitals that have been documented are patient reported.  VideoDeclined- This patient declined Librarian, academic. Therefore the visit was completed with audio only.  Persons Participating in Visit: Patient.  AWV Questionnaire: No: Patient Medicare AWV questionnaire was not completed prior to this visit.  Cardiac Risk Factors include: advanced age (>85men, >51 women);dyslipidemia;hypertension     Objective:    Today's Vitals   01/09/24 1131  Weight: 153 lb (69.4 kg)  Height: 5\' 6"  (1.676 m)   Body mass index is 24.69 kg/m.     01/09/2024   11:43 AM 01/08/2023   10:42 AM 11/29/2022    9:53 AM 10/23/2022   12:28 PM 01/05/2022   10:49 AM 07/11/2021    9:23 AM 01/04/2021   10:25 AM  Advanced Directives  Does Patient Have a Medical Advance Directive? No No No No No No No  Would patient like information on creating a medical advance directive?  No - Patient declined No - Patient declined No - Patient declined Yes (MAU/Ambulatory/Procedural Areas - Information given)  No - Patient declined    Current Medications (verified) Outpatient Encounter Medications as of 01/09/2024  Medication Sig   acetaminophen  (TYLENOL ) 500 MG tablet Take 1,000 mg by mouth every 6 (six) hours as needed for headache.    amLODipine -valsartan  (EXFORGE ) 5-160 MG tablet Take 1 tablet by mouth once daily for blood pressure   aspirin  EC 81 MG tablet Take 1 tablet (81 mg total) by mouth daily. Swallow whole.   atenolol  (TENORMIN ) 25 MG tablet Take 1 tablet by mouth once daily for blood pressure   cyanocobalamin  (VITAMIN B12) 1000 MCG tablet Take 1,000 mcg by mouth daily.   DULoxetine  (CYMBALTA ) 30 MG capsule TAKE 1 CAPSULE BY MOUTH ONCE DAILY FOR ANXIETY AND DEPRESSION   fluticasone  (FLONASE ) 50 MCG/ACT nasal spray Place 2 sprays into both nostrils daily.   ibuprofen (ADVIL) 200 MG tablet Take 400 mg by mouth every 6 (six) hours as needed for mild pain, moderate pain or headache.   isosorbide  mononitrate (IMDUR ) 30 MG 24 hr tablet Take 1 tablet by mouth once daily   Multiple Vitamins-Minerals (PRESERVISION AREDS 2) CAPS Take 1 capsule by mouth daily.   nitroGLYCERIN  (NITROSTAT ) 0.4 MG SL tablet Dissolve 1 tablet under the tongue every 5 minutes for up to 3 doses for chest pain, then call 911   omeprazole  (PRILOSEC) 20 MG capsule Take 1 capsule (20 mg total) by mouth 2 (two) times daily. (Patient taking differently: Take 20 mg by mouth daily.)   rosuvastatin  (CRESTOR ) 10 MG tablet Take 1 tablet by mouth once daily   benzonatate  (TESSALON  PERLES) 100 MG capsule Take 1 capsule (100 mg total) by mouth 3 (three) times daily as needed. (Patient not taking: Reported on 01/09/2024)   sulfamethoxazole -trimethoprim  (BACTRIM  DS) 800-160 MG tablet Take 1 tablet by mouth 2 (two) times daily. For  urinary tract infection. (Patient not taking: Reported on 01/09/2024)   Facility-Administered Encounter Medications as of 01/09/2024  Medication   cyanocobalamin  ((VITAMIN B-12)) injection 1,000 mcg    Allergies (verified) Augmentin  [amoxicillin -pot clavulanate]   History: Past Medical History:  Diagnosis Date   B12 deficiency    Chronic kidney disease    COVID-19 virus infection 06/20/2021   Depression    Dog bite 05/03/2021   GERD  (gastroesophageal reflux disease)    Hyperlipidemia    Hypertension    had ankle swelling with amlodipine    Infection of urinary tract 12/16/2014   Liver mass 12/28/2015   Near syncope    Palpitations    RLS (restless legs syndrome)    Welcome to Medicare preventive visit 10/17/2019   Past Surgical History:  Procedure Laterality Date   BREAST EXCISIONAL BIOPSY Left 1976   benign   CATARACT EXTRACTION, BILATERAL  2021   LEFT HEART CATH AND CORONARY ANGIOGRAPHY N/A 11/29/2022   Procedure: LEFT HEART CATH AND CORONARY ANGIOGRAPHY;  Surgeon: Arnoldo Lapping, MD;  Location: [facility] INVASIVE CV LAB;  Service: Cardiovascular;  Laterality: N/A;   SALPINGOOPHORECTOMY     right; laproscopic   Family History  Problem Relation Age of Onset   Hypertension Mother    Heart attack Mother 31       sudden cardiac death   COPD Father    Heart attack Father 46       developed ischemic cardiomyopathy   Heart disease Sister    Social History   Socioeconomic History   Marital status: Married    Spouse name: Not on file   Number of children: 1   Years of education: Not on file   Highest education level: Not on file  Occupational History   Occupation: retired Ambulance person: Retired   Occupation: HR (part-time)    Employer: [employer]  Tobacco Use   Smoking status: Never   Smokeless tobacco: Never  Vaping Use   Vaping status: Never Used  Substance and Sexual Activity   Alcohol use: No   Drug use: No   Sexual activity: Not on file  Other Topics Concern   Not on file  Social History Narrative   Works part-time in [employer] for [employer] in [employer] in [place] Married   Social Drivers of Health   Financial Resource Strain: Low Risk  (01/09/2024)   Overall Financial Resource Strain (CARDIA)    Difficulty of Paying Living Expenses: Not hard at all  Food Insecurity: No Food Insecurity (01/09/2024)   Hunger Vital Sign    Worried About Running Out of Food in the Last  Year: Never true    Ran Out of Food in the Last Year: Never true  Transportation Needs: No Transportation Needs (01/09/2024)   PRAPARE - Administrator, Civil Service (Medical): No    Lack of Transportation (Non-Medical): No  Physical Activity: Sufficiently Active (01/09/2024)   Exercise Vital Sign    Days of Exercise per Week: 7 days    Minutes of Exercise per Session: 60 min  Stress: No Stress Concern Present (01/09/2024)   [employer] of Occupational Health - Occupational Stress Questionnaire    Feeling of Stress : Not at all  Social Connections: Moderately Integrated (01/09/2024)   Social Connection and Isolation Panel [NHANES]    Frequency of Communication with Friends and Family: More than three times a week    Frequency of Social Gatherings with Friends and Family: More than three  times a week    Attends Religious Services: More than 4 times per year    Active Member of Clubs or Organizations: No    Attends Banker Meetings: Never    Marital Status: Married    Tobacco Counseling Counseling given: Not Answered    Clinical Intake:  Pre-visit preparation completed: Yes  Pain : No/denies pain     BMI - recorded: 24.69 Nutritional Risks: None Diabetes: No  Lab Results  Component Value Date   HGBA1C 5.6 10/11/2018     How often do you need to have someone help you when you read instructions, pamphlets, or other written materials from your doctor or pharmacy?: 1 - Never  Interpreter Needed?: No  Comments: lives with husband Information entered by :: B.Anjalina Bergevin,LPN   Activities of Daily Living     01/09/2024   11:43 AM  In your present state of health, do you have any difficulty performing the following activities:  Hearing? 0  Vision? 0  Difficulty concentrating or making decisions? 0  Walking or climbing stairs? 0  Dressing or bathing? 0  Doing errands, shopping? 0  Preparing Food and eating ? N  Using the Toilet? N  In the  past six months, have you accidently leaked urine? N  Do you have problems with loss of bowel control? N  Managing your Medications? N  Managing your Finances? N  Housekeeping or managing your Housekeeping? N    Patient Care Team: Gabriel John, NP as PCP - General (Internal Medicine) Nahser, Lela Purple, MD as PCP - Cardiology (Cardiology) Serafin Dames, MD as Consulting Physician (Gastroenterology)  Indicate any recent Medical Services you may have received from other than [facility] providers in the past year (date may be approximate).     Assessment:   This is a routine wellness examination for Virgil.  Hearing/Vision screen Hearing Screening - Comments:: Pt says hearing is fine Vision Screening - Comments:: Pt says her [facility]   Goals Addressed             This Visit's Progress    Patient Stated       I will continue to walk 4 days a week for about 2 miles. (Accomplished) 01/09/24,-Pt is walking 7 days one hour currently     Patient Stated       I am trying to stay healthy       Depression Screen     01/09/2024   11:39 AM 06/15/2023   11:41 AM 01/23/2023   11:59 AM 01/08/2023   10:41 AM 10/27/2022   10:29 AM 01/20/2022   10:27 AM 01/05/2022   10:47 AM  PHQ 2/9 Scores  PHQ - 2 Score 0 0 0 0 0 0 0  PHQ- 9 Score  0   0      Fall Risk     01/09/2024   11:35 AM 06/15/2023   11:41 AM 01/23/2023   11:59 AM 01/08/2023   10:43 AM 10/27/2022   10:28 AM  Fall Risk   Falls in the past year? 0 0 1 1 1   Number falls in past yr: 0 0 0 0 0  Comment    twisted ankle and fell   Injury with Fall? 0 0 0 0 0  Risk for fall due to : No Fall Risks No Fall Risks History of fall(s) No Fall Risks History of fall(s)  Follow up Education provided;Falls prevention discussed Falls evaluation completed Falls evaluation completed  Falls prevention discussed;Education provided;Falls evaluation completed Falls evaluation completed    MEDICARE RISK AT  HOME:  Medicare Risk at Home Any stairs in or around the home?: Yes If so, are there any without handrails?: Yes Home free of loose throw rugs in walkways, pet beds, electrical cords, etc?: Yes Adequate lighting in your home to reduce risk of falls?: Yes Life alert?: No Use of a cane, walker or w/c?: No Grab bars in the bathroom?: No Shower chair or bench in shower?: Yes Elevated toilet seat or a handicapped toilet?: Yes  TIMED UP AND GO:  Was the test performed?  No  Cognitive Function: 6CIT completed    01/04/2021   10:32 AM  MMSE - Mini Mental State Exam  Orientation to time 5  Orientation to Place 5  Registration 3  Attention/ Calculation 5  Recall 3  Language- repeat 1        01/09/2024   11:45 AM 01/08/2023   10:45 AM  6CIT Screen  What Year? 0 points 0 points  What month? 0 points 0 points  What time? 0 points 0 points  Count back from 20 0 points 0 points  Months in reverse 0 points 0 points  Repeat phrase 0 points 0 points  Total Score 0 points 0 points    Immunizations Immunization History  Administered Date(s) Administered   Fluad Trivalent(High Dose 65+) 06/15/2023   PFIZER Comirnaty(Gray Top)Covid-19 Tri-Sucrose Vaccine 03/01/2021   PFIZER(Purple Top)SARS-COV-2 Vaccination 10/07/2019, 10/28/2019, 07/31/2020   PNEUMOCOCCAL CONJUGATE-20 01/20/2022   Pfizer Covid-19 Vaccine Bivalent Booster 51yrs & up 08/29/2021   Tdap 10/11/2018   Zoster Recombinant(Shingrix) 06/01/2022, 08/25/2022    Screening Tests Health Maintenance  Topic Date Due   COVID-19 Vaccine (6 - 2024-25 season) 05/20/2023   INFLUENZA VACCINE  04/18/2024   Colonoscopy  08/09/2024   Medicare Annual Wellness (AWV)  01/08/2025   MAMMOGRAM  07/23/2025   DTaP/Tdap/Td (2 - Td or Tdap) 10/11/2028   Pneumonia Vaccine 44+ Years old  Completed   DEXA SCAN  Completed   Hepatitis C Screening  Completed   Zoster Vaccines- Shingrix  Completed   HPV VACCINES  Aged Out   Meningococcal B Vaccine   Aged Out    Health Maintenance  Health Maintenance Due  Topic Date Due   COVID-19 Vaccine (6 - 2024-25 season) 05/20/2023   Health Maintenance Items Addressed: Mammogram ordered  Additional Screening:  Vision Screening: Recommended annual ophthalmology exams for early detection of glaucoma and other disorders of the eye.  Dental Screening: Recommended annual dental exams for proper oral hygiene  Community Resource Referral / Chronic Care Management: CRR required this visit?  No   CCM required this visit?  No     Plan:     I have personally reviewed and noted the following in the patient's chart:   Medical and social history Use of alcohol, tobacco or illicit drugs  Current medications and supplements including opioid prescriptions. Patient is not currently taking opioid prescriptions. Functional ability and status Nutritional status Physical activity Advanced directives List of other physicians Hospitalizations, surgeries, and ER visits in previous 12 months Vitals Screenings to include cognitive, depression, and falls Referrals and appointments  In addition, I have reviewed and discussed with patient certain preventive protocols, quality metrics, and best practice recommendations. A written personalized care plan for preventive services as well as general preventive health recommendations were provided to patient.     Nerissa Bannister, LPN   1/61/0960   After Visit  Summary: (MyChart) Due to this being a telephonic visit, the after visit summary with patients personalized plan was offered to patient via MyChart   Notes: Please refer to Routing Comments.

## 2024-01-13 ENCOUNTER — Other Ambulatory Visit: Payer: Self-pay | Admitting: Cardiovascular Disease

## 2024-01-13 DIAGNOSIS — E782 Mixed hyperlipidemia: Secondary | ICD-10-CM

## 2024-01-13 DIAGNOSIS — I10 Essential (primary) hypertension: Secondary | ICD-10-CM

## 2024-01-13 DIAGNOSIS — I200 Unstable angina: Secondary | ICD-10-CM

## 2024-01-13 DIAGNOSIS — Z01810 Encounter for preprocedural cardiovascular examination: Secondary | ICD-10-CM

## 2024-01-17 ENCOUNTER — Other Ambulatory Visit: Payer: Self-pay | Admitting: Primary Care

## 2024-01-17 DIAGNOSIS — I10 Essential (primary) hypertension: Secondary | ICD-10-CM

## 2024-01-25 ENCOUNTER — Encounter: Payer: Self-pay | Admitting: Primary Care

## 2024-01-25 ENCOUNTER — Other Ambulatory Visit: Payer: Self-pay | Admitting: Primary Care

## 2024-01-25 ENCOUNTER — Ambulatory Visit (INDEPENDENT_AMBULATORY_CARE_PROVIDER_SITE_OTHER): Admitting: Primary Care

## 2024-01-25 VITALS — BP 124/82 | HR 76 | Temp 97.2°F | Ht 66.0 in | Wt 144.0 lb

## 2024-01-25 DIAGNOSIS — E782 Mixed hyperlipidemia: Secondary | ICD-10-CM | POA: Diagnosis not present

## 2024-01-25 DIAGNOSIS — R7303 Prediabetes: Secondary | ICD-10-CM

## 2024-01-25 DIAGNOSIS — Z0000 Encounter for general adult medical examination without abnormal findings: Secondary | ICD-10-CM | POA: Diagnosis not present

## 2024-01-25 DIAGNOSIS — F3341 Major depressive disorder, recurrent, in partial remission: Secondary | ICD-10-CM

## 2024-01-25 DIAGNOSIS — I10 Essential (primary) hypertension: Secondary | ICD-10-CM

## 2024-01-25 DIAGNOSIS — K219 Gastro-esophageal reflux disease without esophagitis: Secondary | ICD-10-CM | POA: Diagnosis not present

## 2024-01-25 DIAGNOSIS — N1831 Chronic kidney disease, stage 3a: Secondary | ICD-10-CM

## 2024-01-25 DIAGNOSIS — R002 Palpitations: Secondary | ICD-10-CM

## 2024-01-25 LAB — LIPID PANEL
Cholesterol: 149 mg/dL (ref 0–200)
HDL: 45.2 mg/dL (ref >39.00)
LDL Cholesterol: 85 mg/dL (ref 0–99)
NonHDL: 104.14
Total CHOL/HDL Ratio: 3
Triglycerides: 96 mg/dL (ref 0.0–149.0)
VLDL: 19.2 mg/dL (ref 0.0–40.0)

## 2024-01-25 LAB — COMPREHENSIVE METABOLIC PANEL WITH GFR
ALT: 8 U/L (ref 0–35)
AST: 12 U/L (ref 0–37)
Albumin: 4.4 g/dL (ref 3.5–5.2)
Alkaline Phosphatase: 69 U/L (ref 39–117)
BUN: 20 mg/dL (ref 6–23)
CO2: 28 meq/L (ref 19–32)
Calcium: 9.4 mg/dL (ref 8.4–10.5)
Chloride: 107 meq/L (ref 96–112)
Creatinine, Ser: 1.13 mg/dL (ref 0.40–1.20)
GFR: 49.58 mL/min — ABNORMAL LOW (ref >60.00)
Glucose, Bld: 118 mg/dL — ABNORMAL HIGH (ref 70–99)
Potassium: 4.3 meq/L (ref 3.5–5.1)
Sodium: 143 meq/L (ref 135–145)
Total Bilirubin: 1.1 mg/dL (ref 0.2–1.2)
Total Protein: 6.9 g/dL (ref 6.0–8.3)

## 2024-01-25 LAB — HEMOGLOBIN A1C: Hgb A1c MFr Bld: 6.4 % (ref 4.6–6.5)

## 2024-01-25 NOTE — Assessment & Plan Note (Signed)
 Immunizations UTD. Mammogram UTD Colonoscopy UTD, due November 2025, she is aware.   Discussed the importance of a healthy diet and regular exercise in order for weight loss, and to reduce the risk of further co-morbidity.  Exam stable. Labs pending.  Follow up in 1 year for repeat physical.

## 2024-01-25 NOTE — Assessment & Plan Note (Signed)
 Controlled.  Continue amlodipine -valsartan  5-160 mg daily and atenolol  25 mg daily. CMP pending.

## 2024-01-25 NOTE — Progress Notes (Signed)
 Subjective:    Patient ID: Heather [name], female    DOB: May 01, 1956, 68 y.o.   MRN: [id number]  HPI  Heather [name] is a very pleasant 68 y.o. female who presents today for complete physical and follow up of chronic conditions.   Immunizations: -Tetanus: Completed in 2020 -Shingles: Completed Shingrix series -Pneumonia: Completed Prevnar 20 in 2023  Diet: Fair diet.  Exercise: No regular exercise.  Eye exam: Completes annually  Dental exam: Completes semi-annually    Mammogram: Completed in November 2024 Bone Density Scan: Completed in October 2023  Colonoscopy: Completed in 2022, due November 2025  BP Readings from Last 3 Encounters:  01/25/24 124/82  06/15/23 (!) 144/82  01/23/23 132/78       Review of Systems  Constitutional:  Negative for unexpected weight change.  HENT:  Negative for rhinorrhea.   Respiratory:  Negative for cough and shortness of breath.   Cardiovascular:  Negative for chest pain.  Gastrointestinal:  Negative for constipation and diarrhea.  Genitourinary:  Negative for difficulty urinating.  Musculoskeletal:  Negative for arthralgias and myalgias.  Skin:  Negative for rash.  Allergic/Immunologic: Negative for environmental allergies.  Neurological:  Negative for dizziness and headaches.  Psychiatric/Behavioral:  The patient is not nervous/anxious.          Past Medical History:  Diagnosis Date   B12 deficiency    Chest pain of uncertain etiology 07/19/2010   Qualifier: Diagnosis of   By: Heather Anda, MD, [place]         Chronic kidney disease    COVID-19 virus infection 06/20/2021   Depression    Dog bite 05/03/2021   GERD (gastroesophageal reflux disease)    Hyperlipidemia    Hypertension    had ankle swelling with amlodipine    Infection of urinary tract 12/16/2014   Liver mass 12/28/2015   Near syncope    Palpitations    RLS (restless legs syndrome)    Welcome to Medicare preventive visit 10/17/2019    Social History    Socioeconomic History   Marital status: Married    Spouse name: Not on file   Number of children: 1   Years of education: Not on file   Highest education level: Not on file  Occupational History   Occupation: retired Ambulance person: Retired   Occupation: HR (part-time)    Employer: [employer]  Tobacco Use   Smoking status: Never   Smokeless tobacco: Never  Vaping Use   Vaping status: Never Used  Substance and Sexual Activity   Alcohol use: No   Drug use: No   Sexual activity: Not on file  Other Topics Concern   Not on file  Social History Narrative   Works part-time in [employer] for [employer] in [employer] in [place] Married   Social Drivers of Health   Financial Resource Strain: Low Risk  (01/09/2024)   Overall Financial Resource Strain (CARDIA)    Difficulty of Paying Living Expenses: Not hard at all  Food Insecurity: No Food Insecurity (01/09/2024)   Hunger Vital Sign    Worried About Running Out of Food in the Last Year: Never true    Ran Out of Food in the Last Year: Never true  Transportation Needs: No Transportation Needs (01/09/2024)   PRAPARE - Administrator, Civil Service (Medical): No    Lack of Transportation (Non-Medical): No  Physical Activity: Sufficiently Active (01/09/2024)   Exercise Vital Sign    Days of Exercise per  Week: 7 days    Minutes of Exercise per Session: 60 min  Stress: No Stress Concern Present (01/09/2024)   [employer] of Occupational Health - Occupational Stress Questionnaire    Feeling of Stress : Not at all  Social Connections: Moderately Integrated (01/09/2024)   Social Connection and Isolation Panel [NHANES]    Frequency of Communication with Friends and Family: More than three times a week    Frequency of Social Gatherings with Friends and Family: More than three times a week    Attends Religious Services: More than 4 times per year    Active Member of [employer] or Organizations: No    Attends  Banker Meetings: Never    Marital Status: Married  Catering manager Violence: Not At Risk (01/09/2024)   Humiliation, Afraid, Rape, and Kick questionnaire    Fear of Current or Ex-Partner: No    Emotionally Abused: No    Physically Abused: No    Sexually Abused: No    Past Surgical History:  Procedure Laterality Date   BREAST EXCISIONAL BIOPSY Left 1976   benign   CATARACT EXTRACTION, BILATERAL  2021   LEFT HEART CATH AND CORONARY ANGIOGRAPHY N/A 11/29/2022   Procedure: LEFT HEART CATH AND CORONARY ANGIOGRAPHY;  Surgeon: Arnoldo Lapping, MD;  Location: [facility] INVASIVE CV LAB;  Service: Cardiovascular;  Laterality: N/A;   SALPINGOOPHORECTOMY     right; laproscopic    Family History  Problem Relation Age of Onset   Hypertension Mother    Heart attack Mother 26       sudden cardiac death   COPD Father    Heart attack Father 75       developed ischemic cardiomyopathy   Heart disease Sister     Allergies  Allergen Reactions   Augmentin  [Amoxicillin -Pot Clavulanate] Diarrhea    Current Outpatient Medications on File Prior to Visit  Medication Sig Dispense Refill   acetaminophen  (TYLENOL ) 500 MG tablet Take 1,000 mg by mouth every 6 (six) hours as needed for headache.     amLODipine -valsartan  (EXFORGE ) 5-160 MG tablet Take 1 tablet by mouth once daily for blood pressure 90 tablet 0   aspirin  EC 81 MG tablet Take 1 tablet (81 mg total) by mouth daily. Swallow whole. 90 tablet 3   atenolol  (TENORMIN ) 25 MG tablet Take 1 tablet by mouth once daily for blood pressure 90 tablet 0   DULoxetine  (CYMBALTA ) 30 MG capsule TAKE 1 CAPSULE BY MOUTH ONCE DAILY FOR ANXIETY AND DEPRESSION 90 capsule 2   esomeprazole (NEXIUM) 20 MG packet Take 20 mg by mouth daily before breakfast.     ibuprofen (ADVIL) 200 MG tablet Take 400 mg by mouth every 6 (six) hours as needed for mild pain, moderate pain or headache.     isosorbide  mononitrate (IMDUR ) 30 MG 24 hr tablet Take 1 tablet by mouth  once daily 15 tablet 0   Multiple Vitamins-Minerals (PRESERVISION AREDS 2) CAPS Take 1 capsule by mouth daily.     nitroGLYCERIN  (NITROSTAT ) 0.4 MG SL tablet Dissolve 1 tablet under the tongue every 5 minutes for up to 3 doses for chest pain, then call 911 75 tablet 3   rosuvastatin  (CRESTOR ) 10 MG tablet Take 1 tablet by mouth once daily 30 tablet 0   Current Facility-Administered Medications on File Prior to Visit  Medication Dose Route Frequency Provider Last Rate Last Admin   cyanocobalamin  ((VITAMIN B-12)) injection 1,000 mcg  1,000 mcg Intramuscular Once Brentin Shin K, NP  BP 124/82   Pulse 76   Temp (!) 97.2 F (36.2 C) (Temporal)   Ht 5\' 6"  (1.676 m)   Wt 144 lb (65.3 kg)   SpO2 96%   BMI 23.24 kg/m  Objective:   Physical Exam HENT:     Right Ear: Tympanic membrane and ear canal normal.     Left Ear: Tympanic membrane and ear canal normal.  Eyes:     Pupils: Pupils are equal, round, and reactive to light.  Cardiovascular:     Rate and Rhythm: Normal rate and regular rhythm.  Pulmonary:     Effort: Pulmonary effort is normal.     Breath sounds: Normal breath sounds.  Abdominal:     General: Bowel sounds are normal.     Palpations: Abdomen is soft.     Tenderness: There is no abdominal tenderness.  Musculoskeletal:        General: Normal range of motion.     Cervical back: Neck supple.  Skin:    General: Skin is warm and dry.  Neurological:     Mental Status: She is alert and oriented to person, place, and time.     Cranial Nerves: No cranial nerve deficit.     Deep Tendon Reflexes:     Reflex Scores:      Patellar reflexes are 2+ on the right side and 2+ on the left side. Psychiatric:        Mood and Affect: Mood normal.           Assessment & Plan:  Preventative health care Assessment & Plan: Immunizations UTD. Mammogram UTD Colonoscopy UTD, due November 2025, she is aware.   Discussed the importance of a healthy diet and regular  exercise in order for weight loss, and to reduce the risk of further co-morbidity.  Exam stable. Labs pending.  Follow up in 1 year for repeat physical.    Essential hypertension Assessment & Plan: Controlled.  Continue amlodipine -valsartan  5-160 mg daily and atenolol  25 mg daily. CMP pending.    Gastroesophageal reflux disease, unspecified whether esophagitis present Assessment & Plan: Controlled.   Continue Nexium 20 mg daily.   Stage 3a chronic kidney disease (HCC) Assessment & Plan: Repeat renal function pending.    Mixed hyperlipidemia Assessment & Plan: Repeat lipid panel pending. Continue rosuvastatin  10 mg daily.  Orders: -     Lipid panel -     Hemoglobin A1c -     Comprehensive metabolic panel with GFR  Palpitations Assessment & Plan: Resolved.  She questions if she can stop Imdur  as symptoms have resolved. Agree for trial to discontinue Imdur . She will update if symptoms return.  Continue atenolol  25 mg daily.   Recurrent major depressive disorder, in partial remission (HCC) Assessment & Plan: Overall stable.   Continue Cymbalta  30 mg daily.  Continue to monitor.          Compton Brigance K Yvonnia Tango, NP

## 2024-01-25 NOTE — Assessment & Plan Note (Signed)
Repeat renal function pending. 

## 2024-01-25 NOTE — Patient Instructions (Signed)
 Stop by the lab prior to leaving today. I will notify you of your results once received.   You are due for your colonoscopy in November 2025.  Please message me on October 2025 and I will place a referral.  It was a pleasure to see you today!

## 2024-01-25 NOTE — Assessment & Plan Note (Signed)
 Controlled.  Continue Nexium 20 mg daily.

## 2024-01-25 NOTE — Assessment & Plan Note (Signed)
 Overall stable.   Continue Cymbalta  30 mg daily.  Continue to monitor.

## 2024-01-25 NOTE — Assessment & Plan Note (Signed)
 Repeat lipid panel pending. Continue rosuvastatin 10 mg daily.

## 2024-01-25 NOTE — Assessment & Plan Note (Signed)
 Resolved.  She questions if she can stop Imdur  as symptoms have resolved. Agree for trial to discontinue Imdur . She will update if symptoms return.  Continue atenolol  25 mg daily.

## 2024-01-29 DIAGNOSIS — H43823 Vitreomacular adhesion, bilateral: Secondary | ICD-10-CM | POA: Diagnosis not present

## 2024-01-29 DIAGNOSIS — H35033 Hypertensive retinopathy, bilateral: Secondary | ICD-10-CM | POA: Diagnosis not present

## 2024-01-29 DIAGNOSIS — H353133 Nonexudative age-related macular degeneration, bilateral, advanced atrophic without subfoveal involvement: Secondary | ICD-10-CM | POA: Diagnosis not present

## 2024-01-29 DIAGNOSIS — Q141 Congenital malformation of retina: Secondary | ICD-10-CM | POA: Diagnosis not present

## 2024-01-29 DIAGNOSIS — Z961 Presence of intraocular lens: Secondary | ICD-10-CM | POA: Diagnosis not present

## 2024-01-30 ENCOUNTER — Other Ambulatory Visit: Payer: Self-pay | Admitting: Cardiovascular Disease

## 2024-01-30 DIAGNOSIS — E782 Mixed hyperlipidemia: Secondary | ICD-10-CM

## 2024-01-30 DIAGNOSIS — Z01810 Encounter for preprocedural cardiovascular examination: Secondary | ICD-10-CM

## 2024-01-30 DIAGNOSIS — I200 Unstable angina: Secondary | ICD-10-CM

## 2024-01-30 DIAGNOSIS — I10 Essential (primary) hypertension: Secondary | ICD-10-CM

## 2024-02-06 ENCOUNTER — Other Ambulatory Visit: Payer: Self-pay

## 2024-02-06 DIAGNOSIS — E782 Mixed hyperlipidemia: Secondary | ICD-10-CM

## 2024-02-06 DIAGNOSIS — Z01810 Encounter for preprocedural cardiovascular examination: Secondary | ICD-10-CM

## 2024-02-06 DIAGNOSIS — I200 Unstable angina: Secondary | ICD-10-CM

## 2024-02-06 DIAGNOSIS — I10 Essential (primary) hypertension: Secondary | ICD-10-CM

## 2024-02-06 MED ORDER — ROSUVASTATIN CALCIUM 10 MG PO TABS: 10.0000 mg | ORAL_TABLET | Freq: Every day | ORAL | 0 refills | Status: DC

## 2024-02-14 ENCOUNTER — Other Ambulatory Visit: Payer: Self-pay | Admitting: Cardiovascular Disease

## 2024-02-14 DIAGNOSIS — E782 Mixed hyperlipidemia: Secondary | ICD-10-CM

## 2024-02-14 DIAGNOSIS — Z01810 Encounter for preprocedural cardiovascular examination: Secondary | ICD-10-CM

## 2024-02-14 DIAGNOSIS — I10 Essential (primary) hypertension: Secondary | ICD-10-CM

## 2024-02-14 DIAGNOSIS — I200 Unstable angina: Secondary | ICD-10-CM

## 2024-02-21 ENCOUNTER — Other Ambulatory Visit: Payer: Self-pay | Admitting: Cardiovascular Disease

## 2024-02-21 DIAGNOSIS — I200 Unstable angina: Secondary | ICD-10-CM

## 2024-02-21 DIAGNOSIS — I10 Essential (primary) hypertension: Secondary | ICD-10-CM

## 2024-02-21 DIAGNOSIS — E782 Mixed hyperlipidemia: Secondary | ICD-10-CM

## 2024-02-21 DIAGNOSIS — Z01810 Encounter for preprocedural cardiovascular examination: Secondary | ICD-10-CM

## 2024-03-06 ENCOUNTER — Other Ambulatory Visit: Payer: Self-pay | Admitting: Cardiovascular Disease

## 2024-03-06 DIAGNOSIS — Z01810 Encounter for preprocedural cardiovascular examination: Secondary | ICD-10-CM

## 2024-03-06 DIAGNOSIS — E782 Mixed hyperlipidemia: Secondary | ICD-10-CM

## 2024-03-06 DIAGNOSIS — I10 Essential (primary) hypertension: Secondary | ICD-10-CM

## 2024-03-06 DIAGNOSIS — I200 Unstable angina: Secondary | ICD-10-CM

## 2024-03-10 NOTE — Telephone Encounter (Signed)
 Pt is scheduled with Patwardhan on 04/02/24. Will send in enough medication to carry through until then.

## 2024-03-24 DIAGNOSIS — H353133 Nonexudative age-related macular degeneration, bilateral, advanced atrophic without subfoveal involvement: Secondary | ICD-10-CM | POA: Diagnosis not present

## 2024-04-01 ENCOUNTER — Other Ambulatory Visit: Payer: Self-pay | Admitting: Primary Care

## 2024-04-01 DIAGNOSIS — I10 Essential (primary) hypertension: Secondary | ICD-10-CM

## 2024-04-02 ENCOUNTER — Ambulatory Visit: Admitting: Cardiology

## 2024-04-04 ENCOUNTER — Other Ambulatory Visit: Payer: Self-pay | Admitting: Primary Care

## 2024-04-04 DIAGNOSIS — F3342 Major depressive disorder, recurrent, in full remission: Secondary | ICD-10-CM

## 2024-04-08 ENCOUNTER — Other Ambulatory Visit: Payer: Self-pay

## 2024-04-08 DIAGNOSIS — Z01810 Encounter for preprocedural cardiovascular examination: Secondary | ICD-10-CM

## 2024-04-08 DIAGNOSIS — E782 Mixed hyperlipidemia: Secondary | ICD-10-CM

## 2024-04-08 DIAGNOSIS — I200 Unstable angina: Secondary | ICD-10-CM

## 2024-04-08 DIAGNOSIS — I10 Essential (primary) hypertension: Secondary | ICD-10-CM

## 2024-04-08 MED ORDER — ROSUVASTATIN CALCIUM 10 MG PO TABS: 10.0000 mg | ORAL_TABLET | Freq: Every day | ORAL | 0 refills | Status: DC

## 2024-04-14 ENCOUNTER — Other Ambulatory Visit: Payer: Self-pay | Admitting: Primary Care

## 2024-04-14 DIAGNOSIS — I10 Essential (primary) hypertension: Secondary | ICD-10-CM

## 2024-04-28 ENCOUNTER — Ambulatory Visit: Admitting: Cardiology

## 2024-05-05 ENCOUNTER — Other Ambulatory Visit: Payer: Self-pay | Admitting: Physician Assistant

## 2024-05-05 DIAGNOSIS — H35033 Hypertensive retinopathy, bilateral: Secondary | ICD-10-CM | POA: Diagnosis not present

## 2024-05-05 DIAGNOSIS — Q141 Congenital malformation of retina: Secondary | ICD-10-CM | POA: Diagnosis not present

## 2024-05-05 DIAGNOSIS — I200 Unstable angina: Secondary | ICD-10-CM

## 2024-05-05 DIAGNOSIS — H43823 Vitreomacular adhesion, bilateral: Secondary | ICD-10-CM | POA: Diagnosis not present

## 2024-05-05 DIAGNOSIS — H353114 Nonexudative age-related macular degeneration, right eye, advanced atrophic with subfoveal involvement: Secondary | ICD-10-CM | POA: Diagnosis not present

## 2024-05-05 DIAGNOSIS — Z961 Presence of intraocular lens: Secondary | ICD-10-CM | POA: Diagnosis not present

## 2024-05-05 DIAGNOSIS — I10 Essential (primary) hypertension: Secondary | ICD-10-CM

## 2024-05-05 DIAGNOSIS — H353123 Nonexudative age-related macular degeneration, left eye, advanced atrophic without subfoveal involvement: Secondary | ICD-10-CM | POA: Diagnosis not present

## 2024-05-05 DIAGNOSIS — Z01810 Encounter for preprocedural cardiovascular examination: Secondary | ICD-10-CM

## 2024-05-05 DIAGNOSIS — E782 Mixed hyperlipidemia: Secondary | ICD-10-CM

## 2024-05-06 ENCOUNTER — Other Ambulatory Visit: Payer: Self-pay | Admitting: Cardiology

## 2024-05-06 DIAGNOSIS — I200 Unstable angina: Secondary | ICD-10-CM

## 2024-05-06 DIAGNOSIS — I10 Essential (primary) hypertension: Secondary | ICD-10-CM

## 2024-05-06 DIAGNOSIS — E782 Mixed hyperlipidemia: Secondary | ICD-10-CM

## 2024-05-06 DIAGNOSIS — Z01810 Encounter for preprocedural cardiovascular examination: Secondary | ICD-10-CM

## 2024-05-07 MED ORDER — ISOSORBIDE MONONITRATE ER 30 MG PO TB24: 30.0000 mg | ORAL_TABLET | Freq: Every day | ORAL | 0 refills | Status: DC

## 2024-06-16 DIAGNOSIS — Z961 Presence of intraocular lens: Secondary | ICD-10-CM | POA: Diagnosis not present

## 2024-06-16 DIAGNOSIS — H43823 Vitreomacular adhesion, bilateral: Secondary | ICD-10-CM | POA: Diagnosis not present

## 2024-06-16 DIAGNOSIS — Q141 Congenital malformation of retina: Secondary | ICD-10-CM | POA: Diagnosis not present

## 2024-06-16 DIAGNOSIS — H35033 Hypertensive retinopathy, bilateral: Secondary | ICD-10-CM | POA: Diagnosis not present

## 2024-06-16 DIAGNOSIS — H353114 Nonexudative age-related macular degeneration, right eye, advanced atrophic with subfoveal involvement: Secondary | ICD-10-CM | POA: Diagnosis not present

## 2024-06-16 DIAGNOSIS — H353123 Nonexudative age-related macular degeneration, left eye, advanced atrophic without subfoveal involvement: Secondary | ICD-10-CM | POA: Diagnosis not present

## 2024-06-30 DIAGNOSIS — H353133 Nonexudative age-related macular degeneration, bilateral, advanced atrophic without subfoveal involvement: Secondary | ICD-10-CM | POA: Diagnosis not present

## 2024-07-01 ENCOUNTER — Ambulatory Visit: Admitting: Cardiology

## 2024-07-28 ENCOUNTER — Ambulatory Visit: Attending: Cardiology | Admitting: Cardiology

## 2024-07-28 ENCOUNTER — Encounter: Payer: Self-pay | Admitting: Cardiology

## 2024-07-28 VITALS — BP 96/72 | HR 62 | Ht 65.0 in | Wt 135.0 lb

## 2024-07-28 DIAGNOSIS — E782 Mixed hyperlipidemia: Secondary | ICD-10-CM

## 2024-07-28 DIAGNOSIS — R002 Palpitations: Secondary | ICD-10-CM

## 2024-07-28 DIAGNOSIS — I10 Essential (primary) hypertension: Secondary | ICD-10-CM

## 2024-07-28 DIAGNOSIS — H353134 Nonexudative age-related macular degeneration, bilateral, advanced atrophic with subfoveal involvement: Secondary | ICD-10-CM | POA: Diagnosis not present

## 2024-07-28 NOTE — Patient Instructions (Signed)
 Medication Instructions:  STOP Aspirin   STOP Isosorbide    *If you need a refill on your cardiac medications before your next appointment, please call your pharmacy*  Follow-Up: At [facility], you and your health needs are our priority.  As part of our continuing mission to provide you with exceptional heart care, our providers are all part of one team.  This team includes your primary Cardiologist (physician) and Advanced Practice Providers or APPs (Physician Assistants and Nurse Practitioners) who all work together to provide you with the care you need, when you need it.  Your next appointment:   As needed   Provider:   Newman JINNY Lawrence, MD

## 2024-07-28 NOTE — Progress Notes (Signed)
  Cardiology Office Note:  .   Date:  07/28/2024  ID:  Heather [name], DOB [birth year], MRN [id number] PCP: Heather Comer POUR, Heather [name]  [facility] Providers Cardiologist:  Heather Lawrence, Heather [name] PCP: Heather Comer POUR, Heather [name]  Chief Complaint  Patient presents with   Palpitations          Heather [name] is a 68 y.o. female with hypertension, prediabetes, hyperlipidemia, CKD, palpitations  History of Present Illness  Patient was previously seen by Dr. Alveta in 2024.  Workup at that time was reassuring with a structurally normal heart, and no significant coronary artery disease.  Patient was recently had an episode of palpitations.  This occurred in September, where the episode of palpitations and mild lightheadedness lasted for 15-20 minutes.  It has not occurred since then.  She stays active with regular walking and no exercise related symptoms blood pressure is low today, generally higher than this.     Vitals:   07/28/24 0811  BP: 96/72  Pulse: 62  SpO2: 95%      Review of Systems  Cardiovascular:  Positive for palpitations. Negative for chest pain, dyspnea on exertion, leg swelling and syncope.        Studies Reviewed: Heather [name]        EKG 07/28/2024: Normal sinus rhythm Low voltage QRS When compared with ECG of 29-Nov-2022 10:10, No significant change was found    Echocardiogram 2024: LVEF 60 to 65%. Trivial MR, mild TR  Coronary angiogram 2024: Widely patent left main Mild non-obstructive distal LAD stenosis Widely patent left circumflex Large, dominant RCA with mild nonobstructive plaquing Normal LVEDP  Labs 01/2024: Chol 149, TG 96, HDL 45, LDL 85 HbA1C 6.4% Cr 1.1    Physical Exam Vitals and nursing note reviewed.  Constitutional:      General: She is not in acute distress. Neck:     Vascular: No JVD.  Cardiovascular:     Rate and Rhythm: Normal rate and regular rhythm.     Heart sounds: Normal heart sounds. No murmur heard. Pulmonary:      Effort: Pulmonary effort is normal.     Breath sounds: Normal breath sounds. No wheezing or rales.  Musculoskeletal:     Right lower leg: No edema.     Left lower leg: No edema.      VISIT DIAGNOSES:   ICD-10-CM   1. Palpitations  R00.2     2. Essential hypertension  I10 EKG 12-Lead    3. Mixed hyperlipidemia  E78.2 EKG 12-Lead       Heather [name] is a 68 y.o. female with hypertension, prediabetes, hyperlipidemia, CKD, palpitations  Assessment & Plan  Palpitations: 1 of episode lasting for 15-20 minutes, occurred in 05/2024. It is possible that this was an episode of tachyarrhythmia,atrial fibrillation cannot be excluded. Given the infrequent nature of [name] symptoms, extra monitor would be low yield. Have encouraged [name] to use [employer] mobile, or smart watches to longitudinally monitor for any recurrence of atrial fibrillation.  Hypertension: Well-controlled, in fact blood pressure is low today. Have discontinued [name] Imdur , okay to continue atenolol  and amlodipine -valsartan . Continue follow-up with PCP.  No primary indication for aspirin  given only mild nonobstructive CAD, no prior history of MI or stroke. Therefore, bleeding risks outweigh benefits of aspirin . Discontinued aspirin .      F/u as needed  Signed, Heather JINNY Lawrence, Heather [name]

## 2024-08-02 ENCOUNTER — Other Ambulatory Visit: Payer: Self-pay | Admitting: Cardiology

## 2024-08-02 DIAGNOSIS — I10 Essential (primary) hypertension: Secondary | ICD-10-CM

## 2024-08-02 DIAGNOSIS — I200 Unstable angina: Secondary | ICD-10-CM

## 2024-08-02 DIAGNOSIS — Z01810 Encounter for preprocedural cardiovascular examination: Secondary | ICD-10-CM

## 2024-08-02 DIAGNOSIS — E782 Mixed hyperlipidemia: Secondary | ICD-10-CM

## 2025-01-09 ENCOUNTER — Ambulatory Visit
